# Patient Record
Sex: Female | Born: 1940
Health system: Southern US, Community
[De-identification: ages and names within clinical notes are randomized; demographics above are authoritative.]

## PROBLEM LIST (undated history)

## (undated) DIAGNOSIS — I10 Essential (primary) hypertension: Secondary | ICD-10-CM

## (undated) DIAGNOSIS — F419 Anxiety disorder, unspecified: Secondary | ICD-10-CM

## (undated) DIAGNOSIS — E119 Type 2 diabetes mellitus without complications: Secondary | ICD-10-CM

## (undated) DIAGNOSIS — M1990 Unspecified osteoarthritis, unspecified site: Secondary | ICD-10-CM

## (undated) DIAGNOSIS — C801 Malignant (primary) neoplasm, unspecified: Secondary | ICD-10-CM

## (undated) DIAGNOSIS — F32A Depression, unspecified: Secondary | ICD-10-CM

## (undated) DIAGNOSIS — E039 Hypothyroidism, unspecified: Secondary | ICD-10-CM

## (undated) DIAGNOSIS — F329 Major depressive disorder, single episode, unspecified: Secondary | ICD-10-CM

## (undated) HISTORY — DX: Type 2 diabetes mellitus without complications: E11.9

## (undated) HISTORY — DX: Unspecified osteoarthritis, unspecified site: M19.90

## (undated) HISTORY — DX: Essential (primary) hypertension: I10

## (undated) HISTORY — PX: ABDOMINAL HYSTERECTOMY: SHX81

## (undated) HISTORY — PX: KNEE SURGERY: SHX244

## (undated) HISTORY — PX: HERNIA REPAIR: SHX51

## (undated) HISTORY — DX: Depression, unspecified: F32.A

## (undated) HISTORY — DX: Major depressive disorder, single episode, unspecified: F32.9

---

## 2000-05-21 ENCOUNTER — Encounter: Payer: Self-pay | Admitting: Orthopedic Surgery

## 2000-05-28 ENCOUNTER — Inpatient Hospital Stay (HOSPITAL_COMMUNITY): Admission: RE | Admit: 2000-05-28 | Discharge: 2000-06-03 | Payer: Self-pay | Admitting: Orthopedic Surgery

## 2000-05-28 ENCOUNTER — Encounter: Payer: Self-pay | Admitting: Orthopedic Surgery

## 2000-07-14 ENCOUNTER — Encounter: Admission: RE | Admit: 2000-07-14 | Discharge: 2000-08-26 | Payer: Self-pay | Admitting: Orthopedic Surgery

## 2001-04-20 ENCOUNTER — Encounter: Admission: RE | Admit: 2001-04-20 | Discharge: 2001-07-19 | Payer: Self-pay | Admitting: Family Medicine

## 2003-12-02 HISTORY — PX: GALLBLADDER SURGERY: SHX652

## 2004-04-23 ENCOUNTER — Inpatient Hospital Stay (HOSPITAL_COMMUNITY): Admission: EM | Admit: 2004-04-23 | Discharge: 2004-04-26 | Payer: Self-pay | Admitting: Emergency Medicine

## 2004-04-24 ENCOUNTER — Encounter (INDEPENDENT_AMBULATORY_CARE_PROVIDER_SITE_OTHER): Payer: Self-pay | Admitting: Specialist

## 2004-07-19 ENCOUNTER — Observation Stay (HOSPITAL_COMMUNITY): Admission: RE | Admit: 2004-07-19 | Discharge: 2004-07-21 | Payer: Self-pay | Admitting: *Deleted

## 2004-12-01 HISTORY — PX: BACK SURGERY: SHX140

## 2005-06-06 ENCOUNTER — Observation Stay (HOSPITAL_COMMUNITY): Admission: RE | Admit: 2005-06-06 | Discharge: 2005-06-07 | Payer: Self-pay | Admitting: *Deleted

## 2005-10-08 ENCOUNTER — Encounter: Admission: RE | Admit: 2005-10-08 | Discharge: 2005-10-08 | Payer: Self-pay | Admitting: Orthopedic Surgery

## 2005-12-05 ENCOUNTER — Encounter: Admission: RE | Admit: 2005-12-05 | Discharge: 2005-12-05 | Payer: Self-pay | Admitting: Family Medicine

## 2006-01-08 ENCOUNTER — Encounter: Admission: RE | Admit: 2006-01-08 | Discharge: 2006-01-08 | Payer: Self-pay | Admitting: Orthopedic Surgery

## 2006-01-12 ENCOUNTER — Encounter: Admission: RE | Admit: 2006-01-12 | Discharge: 2006-01-12 | Payer: Self-pay | Admitting: Orthopedic Surgery

## 2006-02-18 ENCOUNTER — Ambulatory Visit: Admission: RE | Admit: 2006-02-18 | Discharge: 2006-02-18 | Payer: Self-pay | Admitting: Gynecologic Oncology

## 2006-03-11 ENCOUNTER — Inpatient Hospital Stay (HOSPITAL_COMMUNITY): Admission: RE | Admit: 2006-03-11 | Discharge: 2006-03-16 | Payer: Self-pay | Admitting: Orthopedic Surgery

## 2006-03-11 ENCOUNTER — Ambulatory Visit: Payer: Self-pay | Admitting: Internal Medicine

## 2006-03-11 ENCOUNTER — Encounter (INDEPENDENT_AMBULATORY_CARE_PROVIDER_SITE_OTHER): Payer: Self-pay | Admitting: Specialist

## 2006-03-12 ENCOUNTER — Encounter (INDEPENDENT_AMBULATORY_CARE_PROVIDER_SITE_OTHER): Payer: Self-pay | Admitting: Interventional Cardiology

## 2006-10-27 ENCOUNTER — Ambulatory Visit: Admission: RE | Admit: 2006-10-27 | Discharge: 2006-10-27 | Payer: Self-pay | Admitting: Gynecologic Oncology

## 2006-12-22 ENCOUNTER — Ambulatory Visit (HOSPITAL_COMMUNITY): Admission: RE | Admit: 2006-12-22 | Discharge: 2006-12-23 | Payer: Self-pay | Admitting: Gynecologic Oncology

## 2006-12-22 ENCOUNTER — Encounter (INDEPENDENT_AMBULATORY_CARE_PROVIDER_SITE_OTHER): Payer: Self-pay | Admitting: *Deleted

## 2007-01-01 IMAGING — CR DG CHEST 1V PORT
2 series · 2 of 2 positions shown · non-contrast
Comparison: 12/05/05.
 PORTABLE CHEST - 1 VIEW, 03/11/06 AT 5599 HOURS:

CLINICAL DATA: PICC placement, spinal stenosis.

[view not recorded (1 of 2)]
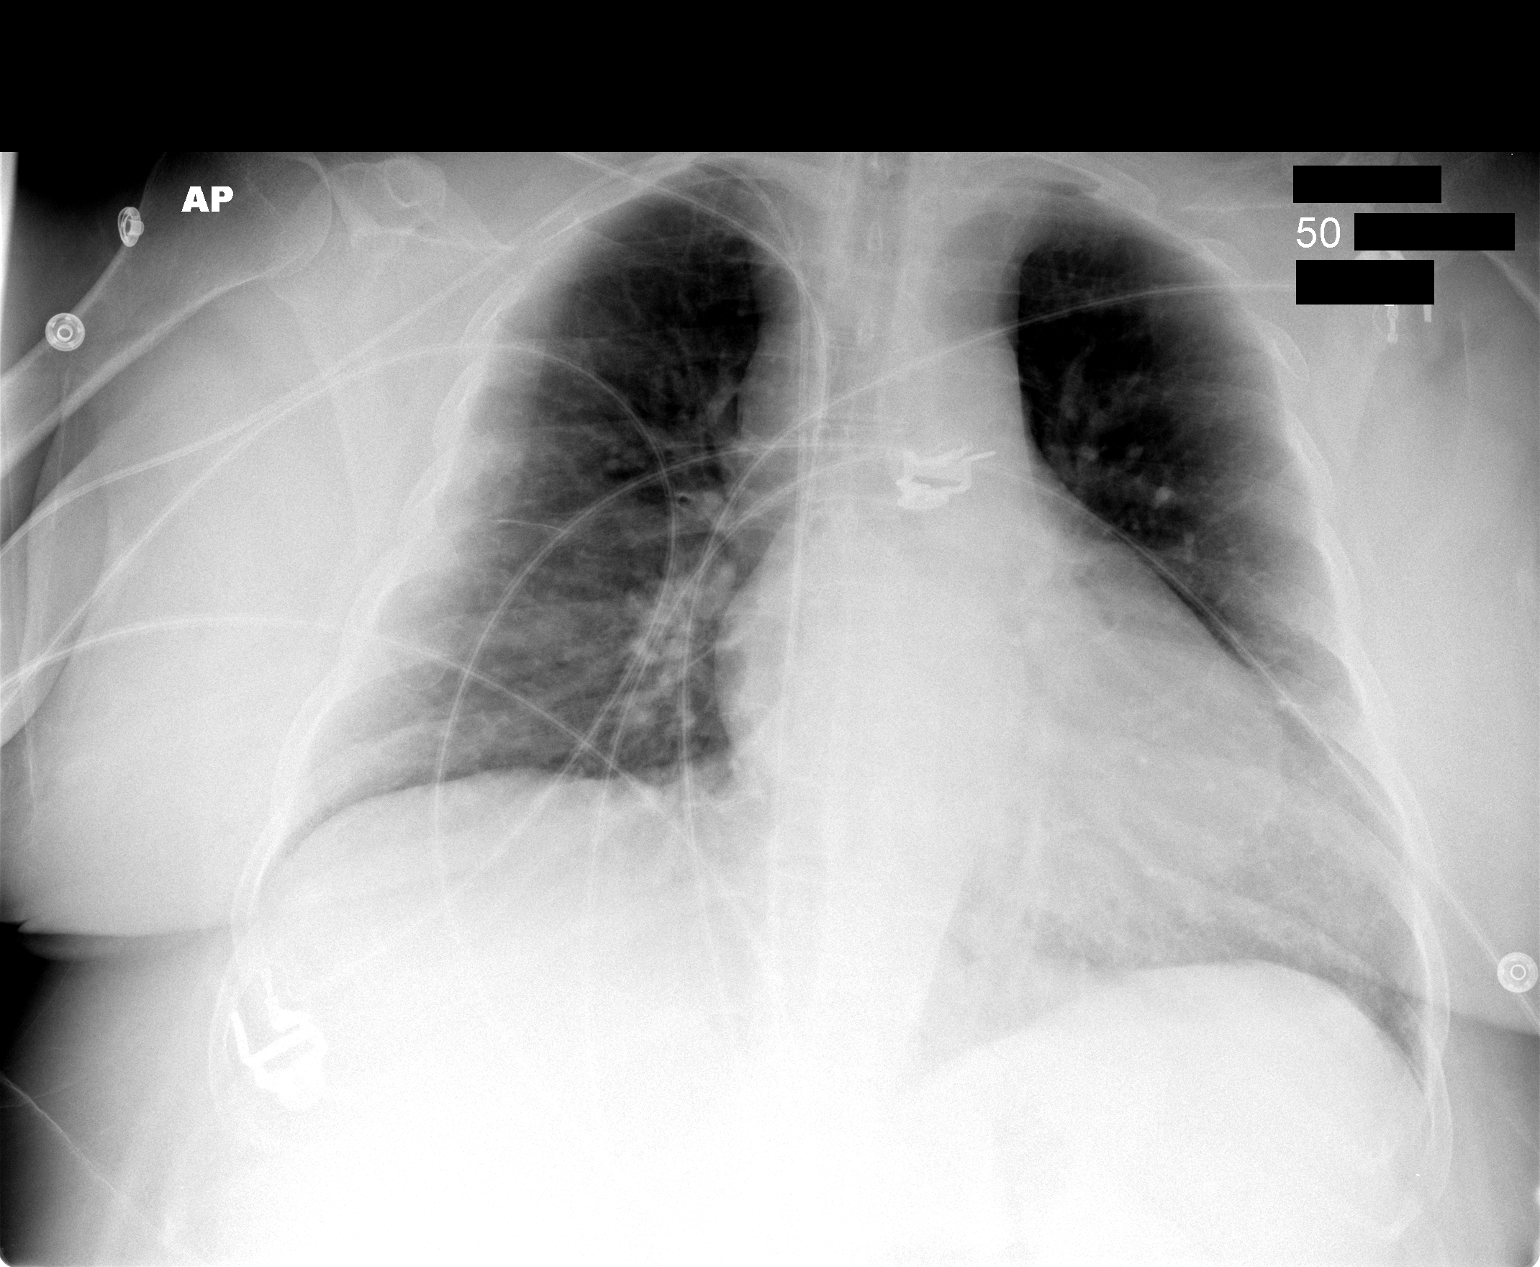

[view not recorded (2 of 2)]
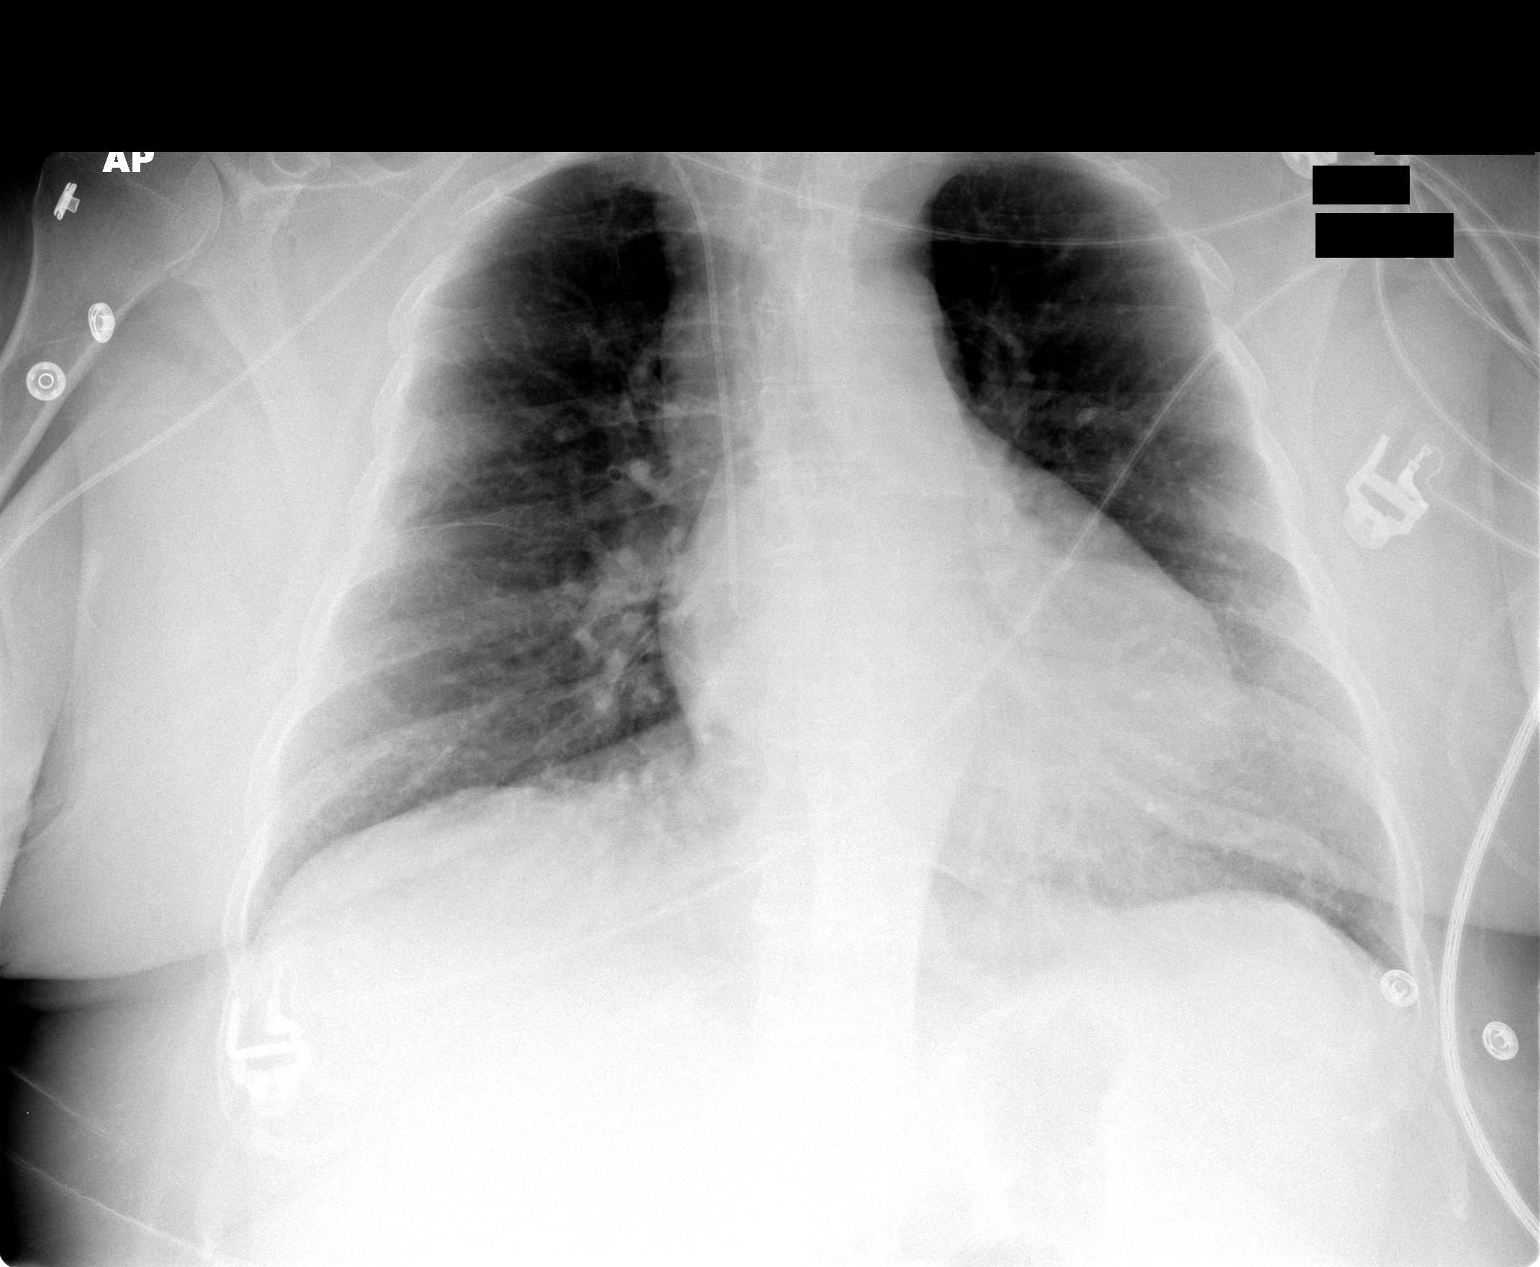

[2 of 2 positions shown; findings below may reference images not displayed]

FINDINGS: The lungs are under inflated with persistent central bronchitic changes.  A right upper extremity PICC has been placed.  The tip is at the SVC/right atrial junction.  The heart is normal in size.  No pneumothoraces or effusions are seen.
IMPRESSION: Bronchitic changes.  PICC.  Tip is at the SVC/right atrial junction.

## 2007-01-02 IMAGING — CR DG CHEST 1V PORT
1 series · 1 of 1 positions shown · non-contrast
Comparison: 03/11/06.

CLINICAL DATA: Spinal stenosis.  Follow-up atelectasis.
 PORTABLE CHEST - 1 VIEW - 03/12/06:

[view not recorded]
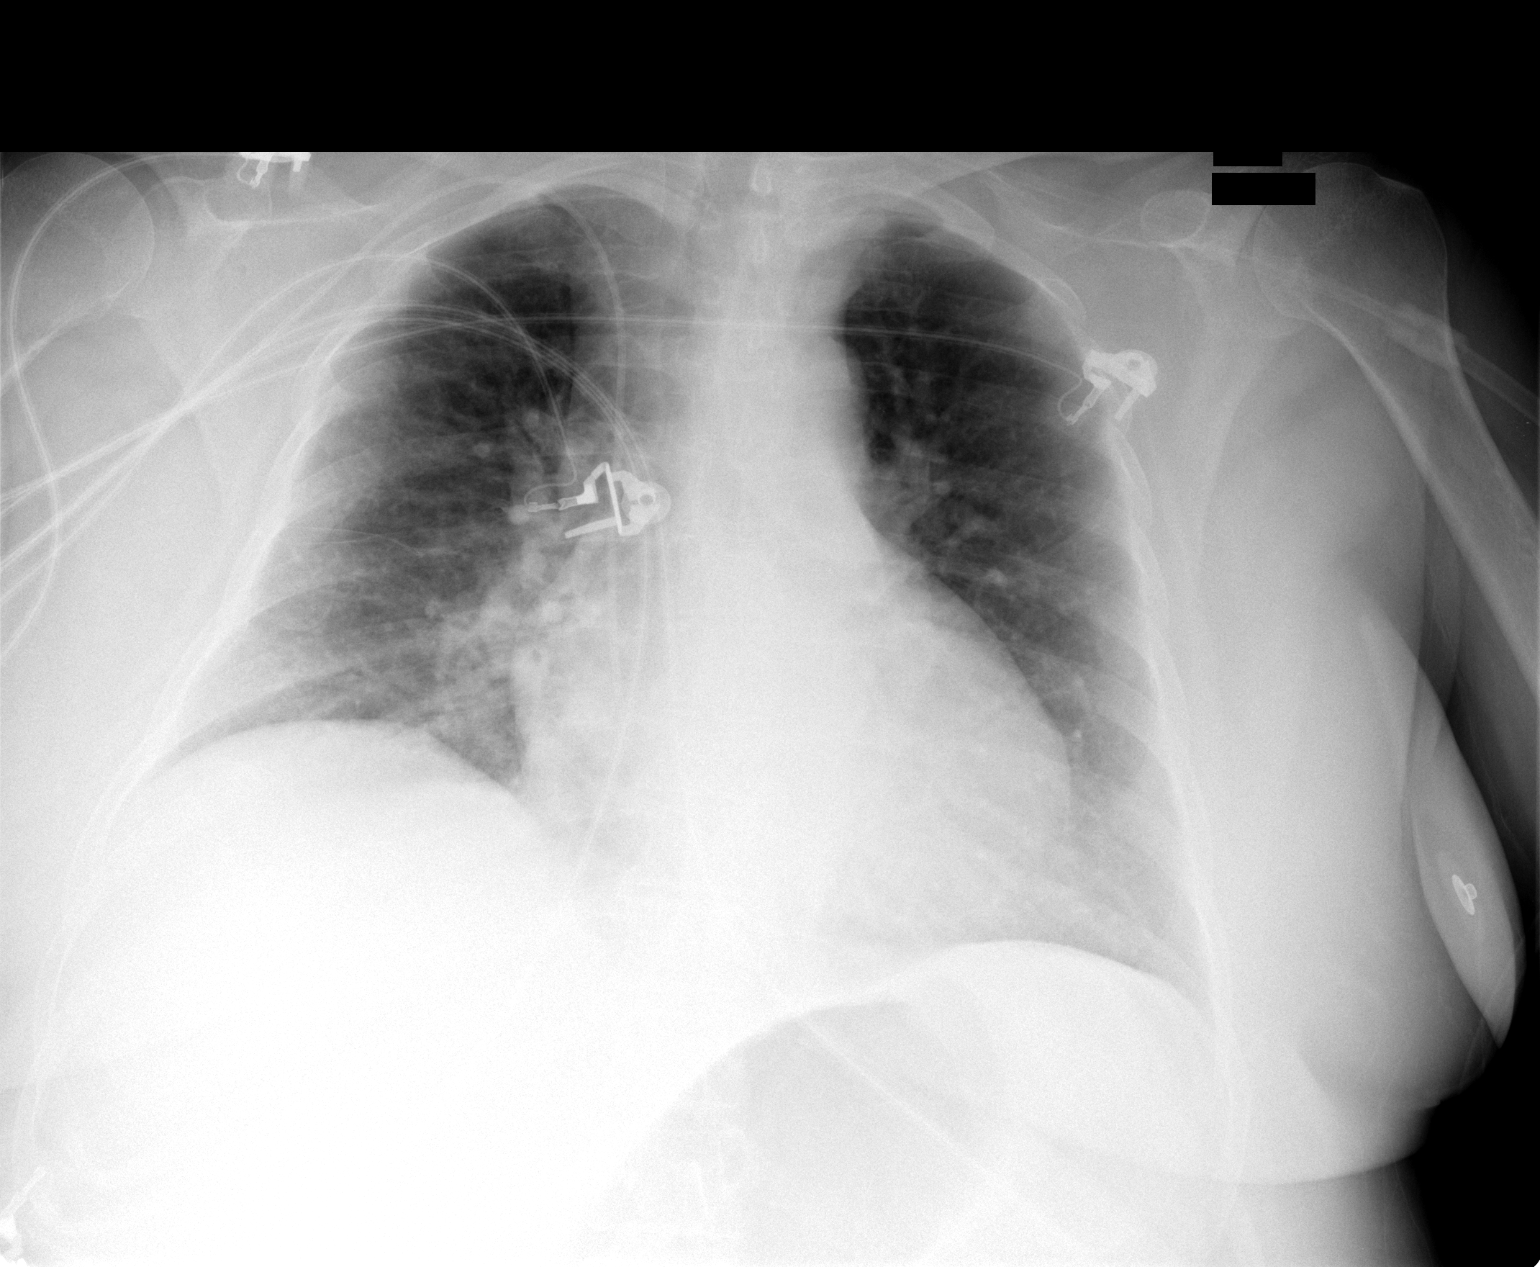

[1 of 1 positions shown; findings below may reference images not displayed]

FINDINGS: A right-sided PICC line is noted with the tip in the projection of the right atrium.  The heart size is mildly enlarged but there is no evidence of failure.  There is volume loss and mild atelectasis at the right base.
IMPRESSION: Mild right base atelectasis with volume loss.

## 2007-09-13 ENCOUNTER — Encounter: Admission: RE | Admit: 2007-09-13 | Discharge: 2007-09-13 | Payer: Self-pay | Admitting: Orthopedic Surgery

## 2007-10-08 ENCOUNTER — Ambulatory Visit (HOSPITAL_BASED_OUTPATIENT_CLINIC_OR_DEPARTMENT_OTHER): Admission: RE | Admit: 2007-10-08 | Discharge: 2007-10-08 | Payer: Self-pay | Admitting: Orthopedic Surgery

## 2008-02-11 ENCOUNTER — Inpatient Hospital Stay (HOSPITAL_COMMUNITY): Admission: RE | Admit: 2008-02-11 | Discharge: 2008-02-15 | Payer: Self-pay | Admitting: Orthopedic Surgery

## 2010-12-22 ENCOUNTER — Encounter: Payer: Self-pay | Admitting: General Surgery

## 2011-04-15 NOTE — Discharge Summary (Signed)
NAME:  [name], [name]              ACCOUNT NO.:  [id number]   MEDICAL RECORD NO.:  [id number]           PATIENT TYPE:  INP   LOCATION:                               FACILITY:  [facility]   PHYSICIAN:  Almedia Balls. Ranell Patrick, M.D. DATE OF BIRTH:  1941/07/16   DATE OF ADMISSION:  02/11/2008  DATE OF DISCHARGE:  02/15/2008                               DISCHARGE SUMMARY   ADMISSION DIAGNOSIS:  Right knee end-stage osteoarthritis.   DISCHARGE DIAGNOSIS:  Right knee end-stage osteoarthritis.   PROCEDURE:  The patient had a right total knee arthroplasty by Dr.  Malon Kindle on February 11, 2008.  Assistant was [employer].  General anesthesia was used plus a femoral block.  Estimated blood loss  was minimal with no complications.   BRIEF HISTORY:  The patient is a 70 year old female with worsening right  knee osteoarthritis.  Patient has elected to have a right total knee  arthroplasty by Dr. Malon Kindle since her symptoms were refractory to  conservative treatment.   HOSPITAL COURSE:  The patient was admitted on February 11, 1999 for the  above stated procedure which she tolerated well.  After adequate time in  the post anesthesia care unit she was transferred up to [address].  The  patient complained about moderate pain in the right knee on postop day  #1, but neurovascularly she was intact.  She was able to work with  physical therapy and also work with her CPM.  She was able to continue  working with physical therapy over the next several days.  We did have  some issues with pain, but the Dilaudid seemed to work best for her  since she does have multiple allergies to pain medicines.  She was able  to wean off the PCA and also the oxygen, and work with physical therapy  quite well before discharged home on postop day #4.  Her labs were  within acceptable limits.  Her incision was healing well with no signs  of cellulitis, erythema or infection.   DISCHARGE PLAN:  The patient has been  discharged home on February 15, 2008.   CONDITION:  Stable.   DIET:  Low sodium.   ALLERGIES:  The patient has allergies to CODEINE, MORPHINE, ASPIRIN,  OXYCODONE, DARVOCET, LATEX, VICODIN, and NOVOCAIN.   DISCHARGE MEDICATIONS:  1. Atenolol 25 mg p.o. daily.  2. Lipitor 80 mg p.o. q.h.s.  3. Celebrex 200 mg p.o. daily.  4. Vasotec 5 mg p.o. daily.  5. Ferrous sulfate 325 mg p.o. t.i.d.  6. Glucotrol 10 mg p.o. b.i.d.  7. Insulin 60 units subcu t.i.d. WC, 1-11 units subcu t.i.d. WC, 2-5      units subcu q.h.s.  8. Glucophage 500 mg p.o. daily a.c. and also 500 mg p.o. __________ .  9. Robaxin 500 mg p.o. t.i.d.  10.Zoloft 100 mg p.o. daily.  11.Coumadin per pharmacy protocol.  12.Albuterol 2 puffs of inhaler q.i.d. p.r.n.  13.Xanax 1 mg p.o. q.h.s. p.r.n.  14.Dilaudid 2 mg 1-2 tablets q.4-6 h. p.r.n. pain.   ACTIVITY:  Normal weightbearing with the aid of  a rolling walker.      Thomas B. Durwin Nora, P.A.      Almedia Balls. Ranell Patrick, M.D.  Electronically Signed    TBD/MEDQ  D:  02/15/2008  T:  02/15/2008  Job:  [id number]

## 2011-04-15 NOTE — Op Note (Signed)
NAME:  [name], [name]              ACCOUNT NO.:  [id number]   MEDICAL RECORD NO.:  [id number]          PATIENT TYPE:  INP   LOCATION:  1614                         FACILITY:  [facility]   PHYSICIAN:  Almedia Balls. Ranell Patrick, M.D. DATE OF BIRTH:  October 14, 1941   DATE OF PROCEDURE:  02/11/2008  DATE OF DISCHARGE:                               OPERATIVE REPORT   PREOPERATIVE DIAGNOSIS:  Right knee end-stage osteoarthritis.   POSTOPERATIVE DIAGNOSIS:  Right knee end-stage osteoarthritis.   PROCEDURE PERFORMED:  Right total knee replacement using the DePuy Sigma  rotating platform prosthesis.   ATTENDING SURGEON:  Almedia Balls. Ranell Patrick, M.D.   ASSISTANT:  Donnie Coffin. Durwin Nora, P.A.   General anesthesia plus femoral block anesthesia was used.   ESTIMATED BLOOD LOSS:  Minimal.   FLUID REPLACEMENT:  1700 mL crystalloid.   URINE OUTPUT:  250 mL.   TOURNIQUET TIME:  1-1/2 at 350 mmHg.   Instrument counts correct.  There were no complications.  Perioperative  antibiotics were given.   INDICATIONS:  The patient is a 70 year old female with worsening right  knee pain secondary to arthritis.  She presents now with disabling knee  pain and functional loss and desired total knee replacement.  Informed  consent was obtained.   DESCRIPTION OF PROCEDURE:  After an adequate level of anesthesia was  achieved with the patient positioned supine on the operating table, the  right leg was correctly identified and a nonsterile tourniquet placed  proximally.  The right leg was sterilely prepped and draped in the usual  manner.  After exsanguination of the limb using an Esmarch bandage, we  elevated the tourniquet to 350 mmHg.  A longitudinal midline incision  was created with the knee in flexion, dissection carried sharply through  subcutaneous tissues using Bovie.  We performed a medial parapatellar  arthrotomy using a fresh 10 blade scalpel.  The patella was everted, the  lateral patellofemoral ligaments divided.  The  distal femur was entered  with a step-cut drill, followed by a distal femoral resection guide  using a 5-degree right setting with 10 mm of resection.  After making  our distal resection, we sized the femur to a size 3.  We went ahead and  performed our anterior-posterior and chamfer cuts with a 4-in-1 block.  We then turned our attention towards the tibia, dividing the ACL and PCL  and subluxing the tibia anteriorly.  We went ahead and performed a 90-  degree perpendicular cut, 4 mm resection off the affected medial side of  the knee, 90 degrees perpendicular to long axis of the tibia.  We  removed excess meniscal tissue prior to that resection.  Once we had our  resection done, we checked our extension and flexion gaps using our  sizing blocks.  We then went and sized our tibia to a size 3 and went  ahead and finished preparing the tibia with the keel punch.  At this  point we went ahead and resected additional bone off the femur using the  size 3 box cut jig.  We then placed our real components in place after  resecting additional posterior bone and osteophytes off the posterior  femoral condyles.  The trial prostheses fit well.  We placed a 12.5  insert and took the knee through a full range of motion, having  excellent balance and full extension.  We then went and resected our  patella and did a freehand resurfacing starting 15 24 mm thickness and  resecting down to 16 to replace with 8 mm of patellar thickness with a  size 32 patella.  We took the knee through a range of motion, noted  excellent patellar tracking.  We removed all components, plugged the end  of the femur with available bone, thoroughly pulse-irrigated the knee  and then went ahead and cemented the real component, size 3 right femur  and a 3 tibia, into place, placing a 12.5 trial insert in the knee and  placed the knee in full extension.  We allowed the cement to harden.  We  had also cemented the patella at the same  time using a patellar clamp.  We then removed excess cement using a curved osteotome, thoroughly  irrigated and then placed a real 12.5 polyethylene insert, noting  excellent soft tissue balance in full extension with the 12.5 trial.  With the real insert in, we went ahead and closed the knee with slight  flexion with interrupted #1 PDS suture for the parapatellar arthrotomy,  followed by 0 and 2-0 Vicryl for subcutaneous closure and 4-0 Monocryl  for skin.  Steri-Strips were applied, followed by a sterile dressing.  The patient tolerated the surgery well.      Almedia Balls. Ranell Patrick, M.D.  Electronically Signed     SRN/MEDQ  D:  02/11/2008  T:  02/12/2008  Job:  [id number]

## 2011-04-15 NOTE — H&P (Signed)
NAME:  [name], [name]              ACCOUNT NO.:  [id number]   MEDICAL RECORD NO.:  [id number]          PATIENT TYPE:  INP   LOCATION:  1614                         FACILITY:  [facility]   PHYSICIAN:  Almedia Balls. Ranell Patrick, M.D. DATE OF BIRTH:  [birth year]   DATE OF ADMISSION:  02/11/2008  DATE OF DISCHARGE:                              HISTORY & PHYSICAL   CHIEF COMPLAINTS:  Right knee pain.   HISTORY OF PRESENT ILLNESS:  The patient is a 70 year old female with  worsening right knee pain has been refractory to conservative treatment.  The patient has elected have a right total knee arthroplasty by Dr.  Malon Kindle.   PAST MEDICAL HISTORY:  1. Osteoporosis.  2. Migraines.  3. Bronchitis.  4. Hypertension.  5. Diabetes.   FAMILY MEDICAL HISTORY:  Coronary artery disease, hypertension, and  diabetes.   SOCIAL HISTORY:  The patient is disabled.  Does not smoke or use  alcohol.  Patient of Dr. Azucena Cecil at [place].   DRUG ALLERGIES:  1. MORPHINE.  2. CODEINE.  3. NOVOCAIN.  4. ASPIRIN.  5. DARVOCET.   CURRENT MEDICATIONS:  1. Vasotec 5 mg p.o. daily.  2. Glipizide 10 mg p.o. daily.  3. Metformin 500 mg 2 tablets each morning.  4. Xanax 0.5 mg p.o. t.i.d.  5. Zoloft 200 mg p.o. daily.  6. Zyrtec 10 mg p.o. daily.  7. Robaxin 500 mg t.i.d. p.r.n.  8. Atenolol 25 mg p.o. daily.  9. Glipizide 10 mg daily.  10.Metformin 500 mg at bedtime.  11.Lipitor 80 mg daily.  12.Tylenol Extra Strength p.r.n.  13.Albuterol inhaler 1-2 puffs q.4-6 h. p.r.n.  14.Ultram 50 mg 1-2 tablets q.4-6 h. p.r.n. pain.   REVIEW OF SYSTEMS:  Pain with ambulation.  Recent occasional headaches,  and also ongoing neck pain.   PHYSICAL EXAMINATION:  VITAL SIGNS:  Pulse 80, respirations 18, blood  pressure 138/78.  GENERAL:  The patient is a 70 year old female in no acute distress.  Pleasant mood and affect.  Alert and oriented x3.  HEAD/NECK:  Shows full range of motion without any difficulty, except  some  mild tenderness along the __________ secondary to known spasm.  Cranial nerves II-XII grossly intact.  CHEST:  Has active breath sounds bilaterally, with no wheezes, rhonchi,  or rales.  HEART:  Shows regular rate and rhythm.  No murmur.  ABDOMEN:  Nontender, nondistended, with active bowel sounds.  EXTREMITIES:  Show moderate tenderness of the right knee, with moderate  crepitus, especially of the medial joint line.  SKIN:  Shows known neurovascularly she is intact, with no rashes.   X-RAY:  Shows end-stage osteoarthritis right knee.   IMPRESSION:  End-stage osteoarthritis right knee.   PLAN OF ACTION:  Right total knee arthroplasty by Dr. Malon Kindle.      Thomas B. Durwin Nora, P.A.      Almedia Balls. Ranell Patrick, M.D.  Electronically Signed    TBD/MEDQ  D:  02/10/2008  T:  02/11/2008  Job:  [id number]

## 2011-04-15 NOTE — Op Note (Signed)
NAME:  [name], [name]              ACCOUNT NO.:  [id number]   MEDICAL RECORD NO.:  [id number]          PATIENT TYPE:  AMB   LOCATION:  [facility]                         FACILITY:  [facility]   PHYSICIAN:  Almedia Balls. Ranell Patrick, M.D. DATE OF BIRTH:  08/27/1941   DATE OF PROCEDURE:  10/08/2007  DATE OF DISCHARGE:  10/08/2007                               OPERATIVE REPORT   PREOPERATIVE DIAGNOSIS:  Right knee medial meniscus tear.   POSTOPERATIVE DIAGNOSES:  1. Right knee medial meniscus tear.  2. Right knee medial femoral condyle chondromalacia.  3. Patellofemoral chondromalacia.   PROCEDURE PERFORMED:  Right knee arthroscopy, debridement of medial  meniscus tear and abrasion chondroplasty of the patellofemoral joint and  medial femoral condyle.   SURGEON:  Almedia Balls. Ranell Patrick, M.D.   ASSISTANT:  None.   ANESTHESIA:  Laryngeal mask anesthesia plus MAC was used.   ESTIMATED BLOOD LOSS:  Minimal.   FLUID REPLACED:  1200 mL crystalloid.   INSTRUMENT COUNTS:  Correct.   COMPLICATIONS:  None.   Perioperative antibiotics were given.   INDICATIONS FOR PROCEDURE:  Ms. [name] is a 70 year old female with an  injury to her right knee and has had a medial meniscus tear which is  proven by MRI scan. The patient has failed to progress with conservative  management including ice, activity modifications, antiinflammatories and  injections and presents now for operative treatment to restore function  and eliminate pain. Informed consent was obtained.   DESCRIPTION OF PROCEDURE:  After an adequate level of anesthesia was  achieved, the patient was positioned supine on the operating room table.  The right leg was placed in an arthroscopic leg holder and correctly  identified, the left leg was placed in a well leg holder. After sterile  prep and drape of the right knee, we entered the knee arthroscopically  using standard arthroscopic portals including superolateral outflow,  anterolateral scoping and  anteromedial working portals. We identified  some mild grade 3 chondromalacia on the patella which was centrally  located. In the trochlea, she had some grade 3 wear as well which  reflects down to the notch and was little bit deeper more distally. She  had no significant wear near the medial and lateral gutters. There was  no patellar maltracking noted, no loose bodies noted in the gutter. No  significant medial plica was noted. The medial compartment was entered,  there was noted to be a complex posterior horn medial meniscus tear. I  performed a partial medial meniscectomy using basket forceps and  notarized shaver removing about 40% of the posterior horn of the medial  meniscus. The remainder of the meniscus was stable. There was a fairly  large 2 x 2 cm area of partial thickness cartilage wear with loose flaps  of cartilage. This was grade 3 changes. We performed an abrasion  chondroplasty on this area. The tibial cartilage had some mild grade 2  wear. ACL and PCL intact. The lateral compartment pristine with minimal  chondromalacia noted and intact meniscus. Following the completion of  the partial meniscectomy and the abrasion chondroplasty in the medial  compartment  and patellofemoral compartment we concluded surgery suturing  the wound with 4-0 Monocryl followed by Steri-Strips and a sterile  dressing. The patient tolerated the procedure well and was taken to the  recovery room. This was done at the [facility].      Almedia Balls. Ranell Patrick, M.D.  Electronically Signed     SRN/MEDQ  D:  10/11/2007  T:  10/12/2007  Job:  [id number]

## 2011-04-18 NOTE — Op Note (Signed)
NAME:  [name], [name]              ACCOUNT NO.:  [id number]   MEDICAL RECORD NO.:  [id number]          PATIENT TYPE:  INP   LOCATION:  0002                         FACILITY:  [facility]   PHYSICIAN:  Georges Lynch. Gioffre, M.D.DATE OF BIRTH:  Dec 23, 1940   DATE OF PROCEDURE:  DATE OF DISCHARGE:                                 OPERATIVE REPORT   PREOPERATIVE DIAGNOSES:  1.  Lateral recess stenosis L4-5 on the left.  2.  Herniated lumbar disk L4-5 on the left.   POSTOPERATIVE DIAGNOSES:  1.  Lateral recess stenosis L4-5 on the left.  2.  Herniated lumbar disk L4-5 on the left.   SURGEON:  Dr. Georges Lynch. Darrelyn Hillock, MD.   OPERATIONS:  Dr. Jene Every, MD.   OPERATION:  1.  Decompression of the lateral recess at L4-5 on the left.  2.  Microdiskectomy at L4-5 on the left.   PROCEDURE:  Under general anesthesia routine orthopedic prep and draping of  the lower back was carried out.  The patient had 1 gram of IV Ancef preop.  At this time, two needles were placed in the back for localization purposes  and x-ray was taken.  Following that, an incision was made over the L4-5  interspace, bleeders identified and cauterized.  We then went down stripped  the muscle from the lamina in the spinous process on the left.  We then took  another x-ray to verify our position.  Once we documented we were at the L4-  5 space, we then went on and carried out our hemilaminectomy in the usual  fashion.  She had rather significant lateral recess stenosis.  We  decompressed the lateral recess first utilizing Kerrison rongeurs and the  double-action rongeur.  We identified the ligamentum flavum, we protected  the dura and then removed the ligamentum flavum, went out laterally and  cauterized the lateral recess veins with a bipolar.  Once we freed up the  dura and the L5 root, we freed up the L5 root by doing a nice foraminotomy.  We identified the disk. She had a definite herniated disk at L4-5 on the  left. A  cruciate incision was made in the posterior longitudinal ligament  and a microdiskectomy was carried out.  Following that, we went out far  laterally into the foramen, we decompressed the foramen as well, examined  the four root and the four root was thoroughly decompressed. We had a real  nice decompression.  We thoroughly irrigated out the area and then loosely  applied some thrombin-soaked Gelfoam and closed the wound in layers in the  usual fashion.  I left the deep proximal part of the wound open for drainage  purposes.  The remaining part of the wound was closed as I mentioned above  and we closed the skin with metal staples and a sterile Neosporin dressing  was applied.  She had 1 gram of IV Ancef preop.  She had Toradol 30 mg IV at  the end of the procedure.          ______________________________  Georges Lynch. Darrelyn Hillock, M.D.  RAG/MEDQ  D:  03/11/2006  T:  03/11/2006  Job:  [id number]

## 2011-04-18 NOTE — Consult Note (Signed)
NAME:  [name], [name]              ACCOUNT NO.:  [id number]   MEDICAL RECORD NO.:  [id number]          PATIENT TYPE:  OUT   LOCATION:  GYN                          FACILITY:  [facility]   PHYSICIAN:  Paola A. Duard Brady, MD    DATE OF BIRTH:  May 07, 1941   DATE OF CONSULTATION:  DATE OF DISCHARGE:                                   CONSULTATION   Patient is seen today in consultation at the request of Dr. Floyde Parkins.   [name] is a 70 year old gravida 5, para 5 who underwent a vaginal  hysterectomy for fibroids and menorrhagia several years ago.  She was seen  by Dr. Darrelyn Hillock secondary to back pain.  She underwent an MRI of the pelvis  on January 08, 2006.  Within it, it appeared that she had several cysts  within the left ovary, the largest being 3.4 cm in diameter with some  internal signal abnormalities, which could represent debris.  She also had  moderate-to-severe joint arthritis at L5-S1 and is scheduled for back  surgery, per her report on April 1.  She was seen by Dr. Rosalio Macadamia on March  7th and underwent a CA-125 testing, which was normal at 5.  In addition, she  underwent a transvaginal ultrasound, which revealed a right ovary with a  simple cyst, and the left ovary contained a 5 cm solid mass, consistent with  a fibroma, which is complex in nature, with some fluid in the cul de sac.  This is not appreciated on MRI.  The patient herself says that if she had  not had the back pain and the MRI of her back, she would never have known  she had an ovarian cyst.  She does complain of some urge incontinence but is  otherwise asymptomatic.   REVIEW OF SYSTEMS:  She denies any chest pain, shortness of breath, nausea,  vomiting, fevers, chills, headaches, or visual changes.  She complain of  significant back and neck pain and is on disability for neck pain.  She  denies any abdominal bloating or early satiety.  She is quite sedentary and  has a poor exercise tolerance as a result  of that.  She denies any blood in  her stool, any change in her bowel caliber.  She does complain of urge  incontinence, as stated above.   ALLERGIES:  1.  CODEINE.  2.  MORPHINE.  3.  ASPIRIN.  4.  NOVOCAIN.   PAST MEDICAL HISTORY:  1.  Diabetes.  2.  Hypertension.   PAST SURGICAL HISTORY:  1.  Cataract surgeries.  2.  Left knee replacement.  3.  Cholecystectomy done laparoscopically.  4.  A laparoscopic umbilical hernia repair.  Two subsequent hernia repairs      in the umbilicus with mesh.  5.  Back disk surgery.  6.  Hysterectomy.   MEDICATIONS:  1.  Meprozine 50/25 q.4h. p.r.n.  2.  Tramadol 50 mg q.6h. p.r.n.  3.  Glipizide 10 mg twice daily.  4.  Methocarbamol 500 mg q.6h.  5.  Sertraline 100 mg daily.  6.  Enalapril 20  mg daily.  7.  Zyrtec 10 mg daily.  8.  Alprazolam 0.5 mg b.i.d. p.r.n.  9.  Triamterene/hydrochlorothiazide 75/50 1 p.o. daily.  10. Metformin 500 mg twice daily.  11. Lipitor 80 mg daily.   FAMILY HISTORY:  Her mother died in 01/04/2006.  She has a sister with  lung cancer.  There is high blood pressure, coronary disease, diabetes, and  heart disease in her family.   SOCIAL HISTORY:  She does not smoke, use alcohol.  She is currently  disabled.   PHYSICAL EXAMINATION:  VITAL SIGNS:  Weight 200 pounds.  Height 5 feet.  GENERAL:  Well-developed and well-nourished female in no acute distress.  NECK:  Supple.  There is no lymphadenopathy or thyromegaly.  LUNGS:  Clear to auscultation bilaterally.  CARDIOVASCULAR:  Regular rate and rhythm.  ABDOMEN:  She has a well-healed vertical skin incision as well as  laparoscopy skin incisions.  Abdomen is morbidly obese, soft, nontender,  nondistended.  There are no palpable masses or hepatosplenomegaly.  Exam is  limited severely by habitus.  She has minimal tenderness in the deep right  lower quadrant.  Groins are negative for adenopathy.  EXTREMITIES:  There is no edema.  PELVIC:  Bimanual  examination reveals no masses or nodularity.  Rectal  confirms there is no stool in the vault for guaiac.   ASSESSMENT:  A 70 year old morbidly obese female with multiple medical  comorbidities, who has had three umbilical hernia repairs, who has by MRI a  multicystic left ovary by ultrasound in Dr. Particia Jasper office.  She has a  solid left ovarian mass and a normal CA-125.  I had a lengthy discussion  with the patient and with her friend here.  We spoke for over 30 minutes  face-to-face time independent of her examination and initial consultation.  I do not believe that this represents a metastatic or malignant process;  however, this cannot be excluded completely.  I do think she needs surgical  intervention; however, her back pain is severe and disabling, and I think  she needs to proceed with her back surgery with Dr. Darrelyn Hillock prior.  She was  given my card.  I discussed with her that we will attempt this  laparoscopically to decrease her morbidity, as she has multiple medical  comorbidities.  I assume she will be undergoing preoperative cardiac risk  stratification and clearance prior to her back surgery, and we can use that  as well.  We will notify her primary doctor, Dr. Azucena Cecil, that we were  considering abdominal surgery and he can evaluate whether she needs any  different preoperative evaluation than she would for her back surgery.  She  was given my card as well as that of Telford Nab.  She will call me once  she is recovering from her back surgery so  we can tentatively schedule her for laparoscopic BSO.  She understands the  mass will be sent for frozen section.  If it is benign, we will complete the  procedure, otherwise we will need to proceed with laparotomy for staging.  She does know that a laparotomy may be required to remove the mass.      Paola A. Duard Brady, MD  Electronically Signed    PAG/MEDQ  D:  02/18/2006  T:  02/19/2006  Job:  [id number]   cc:   Sherry A.  Rosalio Macadamia, M.D.  Fax: [phone]   Telford Nab, R.N.  [address]  [place], [place] [address]  Tally Joe, M.D.  Fax: [phone]   Alfonse Ras, MD  [address]  [place]  [place] [address]   Georges Lynch. Darrelyn Hillock, M.D.  Fax: [phone]   Almedia Balls. Ranell Patrick, M.D.  Fax: [phone]

## 2011-04-18 NOTE — Discharge Summary (Signed)
NAME:  [name], [name]              ACCOUNT NO.:  [id number]   MEDICAL RECORD NO.:  [id number]          PATIENT TYPE:  INP   LOCATION:  1420                         FACILITY:  [facility]   PHYSICIAN:  Georges Lynch. Gioffre, M.D.DATE OF BIRTH:  05/20/1941   DATE OF ADMISSION:  03/11/2006  DATE OF DISCHARGE:  03/16/2006                                 DISCHARGE SUMMARY   ADMISSION DIAGNOSES:  1.  Spinal stenosis with lateral recess at L4-5 on the left.  2.  Hypertension.  3.  Diabetes.  4.  History of cervical degenerative disease.   DISCHARGE DIAGNOSES:  1.  Status post decompression of the L4-5 left with microdiskectomy at L4-5      left.  2.  Postoperative hypotension requiring Neo-Synephrine and then follow up      with dopamine drip for maintaining systolic blood pressure felt related      to Vasotec versus perioperative beta blocker.  3.  History of hypertension.  4.  History of diabetes.  5.  History of cervical degenerative disease.   HISTORY OF PRESENT ILLNESS:  The patient is a 70 year old female having  lower back pain and left leg numbness since early last summer.  She has been  evaluated by Dr. Darrelyn Hillock, and found to have classic L4-5 nerve distribution  of stenosis and numbness, confirmed with myelogram and the MRI.  Proceeded  to have a decompression or lateral recess at L4-5 and had a microdiskectomy  at L4-5 on the left.   ALLERGIES:  1.  CODEINE.  2.  MORPHINE.  3.  VICODIN.  4.  NOVOCAIN.  5.  ASPIRIN.  6.  OXYCODONE.  7.  DARVOCET.   The patient tolerates Mepergan and fortis well.   CURRENT MEDICATIONS ON ADMISSION:  1.  Enalapril 20 mg a day.  2.  Robaxin 500 mg 3 times a day.  3.  Glipizide ER 10 mg 2 tablets q.a.m.  4.  Metformin 1000 mg in the morning.  5.  Triamterene/hydrochlorothiazide 75/50, one half tablet a day.  6.  Lipitor 80 mg a day.  7.  Alprazolam 0.5 mg twice a day.  8.  __________/HCL 100 mg in the a.m.  9.  Zyrtec 10 mg in the morning.  10. Ultram 1 or 2 tablets every 4-6 hours for pain.  11. Meprozine 25-50 mg twice a day p.r.n.  12. Atenolol 25 mg a day, just started perioperative for her cardiac      protection.   CONSULTS:  The following routine consults were requested:  Physical therapy,  occupational therapy and case management.   A hospitalist consult with Dr. Tresa Endo was requested for evaluation and  management of the patient's postoperative hypotension.  He placed her in the  ICU for a period of time and had a critical care consult for monitoring  while in the ICU.   HOSPITAL COURSE:  On March 11, 2006, the patient was admitted to [facility] under the care of Dr. Ranee Gosselin and was taken to the OR by Dr.  Ranee Gosselin, and assisted by Dr. Jillyn Hidden.  The patient underwent  a central  decompression at L4-5 with a microdiskectomy at L4-5 on the left.  The  patient tolerated the procedure well, but while in recovery room had some  hypotension, which initially was evaluated by anesthesia.  Patient was  placed on a Neo drip to maintain a blood pressure systolic of the 90s.  A  consult was requested for the hospitalist and Dr. Tresa Endo evaluated the  patient.  It was felt possibly related to the ACE inhibitors perioperatively  and was transitioned over to a dopamine drip to maintain blood pressures  greater than the 90s.  The patient's diuretic and blood pressure medications  were placed on hold.  She was worked up further with an echocardiogram and a  CT evaluation of her abdomen to rule out a retroperitoneal bleed due to her  surgical procedures.  Over the course of 24-48 hours, the patient's blood  pressures did normalize and she was weaned off of the dopamine drip.  During  her time in the ICU, she had no other medical issues and she was transferred  back to the orthopedic floor for routine postoperative care.  Blood pressure  did normalize to the 120s and 140s systolic.  She was able to restart her   routine preoperative meds, but recommended the diuretic to be placed on  hold.  Also, recommended to hold the patient's muscle relaxant and Tenormin.  The patient continued to do very well from the orthopedic standpoint.  She  had a minimal amount of discomfort in the lower back and the legs.  She has  had some residual numbness down the leg, but otherwise she had a good light  touch sensation and motor in the lower extremities bilaterally.  She worked  well with physical therapy and it was felt on postop day #5, she was  orthopaedically and medically stable, and recommended to be discharged to  home with recommendations to follow up with her primary care physician.  The  patient was discharged in good condition.   LABORATORY:  CBC on April 12:  WBCs 12.2, hemoglobin 9.9 and hematocrit  28.9.  Sed rate on April 12 was 40.  Routine chemistries on April 14:  Sodium of 139, potassium of 3.9, glucose 144 very labile between 93 and 180  during her hospitalization, BUN 6 and creatinine 0.7.  Cardiac markers on  April 11 at 1500 and found CK at 153, CK-MB 3.8, relative index of 2.5 and  troponin-I 0.02.  Followup on 2100:  CK is 176, CK-MB 4, relative index 2.3  and troponin-I is 0.02.  On April 12 at 0300:  CK 179, CK-MB 4.7, relative  index 2.6 and a troponin-I of 0.03.  BNP taken on April 12 showed 146.  Cortisol level on April 12 was 7.6 at 8:00 in the morning, at 3:00 in the  afternoon it was 10.7 and at 5:00 it was 26.7.  The summary of her 2-D echo  showed overall left ventricular systolic function was normal, left  ventricular ejection fraction estimated to be 60% with no evidence of left  ventricular wall motion abnormalities, left ventricular wall thickness  appears to the upper limits of normal and there was a mild focal basilar  septal hypertrophy.  Aortic valve thickness is mildly increased.  Aortic valve is mildly calcific.  There is mild calcification of the mitral valve  and mild  mitral annular calcification.  EKG on April 11 found sinus rhythm  with occasional periventricular complexes at 72 beats per  minute.   MEDICATIONS UPON DISCHARGE FROM ORTHOPEDICS FLOOR:  1.  Xanax 0.5 mg p.o. b.i.d.  2.  Lipitor 80 mg p.o. q.h.s.  3.  The patient is monitored per the glucose sliding scale protocol.  4.  Robaxin 500 mg p.o. t.i.d.  5.  Zoloft 100 mg p.o. b.i.d.  6.  Vasotec 10 mg p.o. b.i.d.  7.  Glucophage 500 mg p.o. b.i.d.  8.  MiraLax 17 g p.o. t.i.d.  9.  Miconazole topical cream to be applied to the affected area twice a day.  10. Senokot-S 1 tablet p.o. q.h.s. p.r.n.  11. Laxative enema of choice p.r.n.  12. Phenergan 25 mg p.o. q.6h. P.r.n.  13. Ultram 50 mg p.o. q.i.d. p.r.n.  14. Demerol 50 mg p.o. q.4h. p.r.n.   DISCHARGE INSTRUCTIONS:  DIET:  The patient is to follow an 1800 calorie  diabetic diet.  ACTIVITY:  The patient is to walk with the use of assistance and walker  daily.  WOUND CARE:  Patient is to change dressing daily.  MEDICATIONS:  Patient can take the following medications:  1.  Vasotec 20 mg a day.  2.  Glipizide 10 mg p.o. b.i.d.  3.  Metformin 500 mg p.o. b.i.d.  4.  Lipitor 80 mg p.o. a day.  5.  Xanax 0.5 mg p.o. b.i.d.  6.  Zoloft 200 mg p.o. a day.  7.  Zyrtec 10 mg a day.  8.  Medical hospitalist does not want the patient to take the Dyazide and      Atenolol until after seeing primary care physician.  FOLLOW-UP:  The patient is to follow up with Dr. Darrelyn Hillock at [phone]  extension [phone] for a follow up 2 weeks from discharge.   [facility] for home health physical therapy.   The patient is to contact her medical doctor for follow up with primary care  doctor as directed by hospitalist.   CONDITION UPON DISCHARGE TO HOME:  Listed as improved and good.      Jamelle Rushing, P.A.    ______________________________  Georges Lynch Darrelyn Hillock, M.D.    RWK/MEDQ  D:  04/06/2006  T:  04/07/2006  Job:  [id number]

## 2011-04-18 NOTE — Discharge Summary (Signed)
NAME:  [name], [name]                        ACCOUNT NO.:  [id number]   MEDICAL RECORD NO.:  [id number]                   PATIENT TYPE:  OBV   LOCATION:  0478                                 FACILITY:  [facility]   PHYSICIAN:  Thornton Park. Daphine Deutscher, M.D.             DATE OF BIRTH:  1941-08-18   DATE OF ADMISSION:  07/19/2004  DATE OF DISCHARGE:  07/21/2004                                 DISCHARGE SUMMARY   ADMITTING DIAGNOSES:  Ventral hernia, status post laparoscopic  cholecystectomy and open hysterectomy done Apr 24, 2004.   PROCEDURE:  July 19, 2004, laparoscopic ventral hernia repair with mesh.   HOSPITAL COURSE:  This is a 70 year old white female, who underwent the  above-mentioned laparoscopic procedure on July 19, 2004, without incident.  Postoperatively, she was slow to mobilize but seemed to progress  satisfactorily.  She was unable to go home on postoperative day one and was  encouraged in mobilization, and bowel function returned as evidenced by  bowel movements, and she felt ready for discharge on postoperative day two,  July 21, 2004.  Her abdominal binder was in place.  She was instructed to  remove her surgical dressings on the day after discharge, meant to wear her  abdominal binder.  She was asked to return to see Dr. Luan Pulling in 2-3  weeks.  She was given a prescription for Vicodin to take with her for pain  if needed.   CONDITION:  Improved.   FINAL DIAGNOSIS:  Ventral incisional hernia, status post laparoscopic repair  with mesh.                                               Thornton Park Daphine Deutscher, M.D.    MBM/MEDQ  D:  07/21/2004  T:  07/21/2004  Job:  [id number]

## 2011-04-18 NOTE — Op Note (Signed)
NAME:  [name], [name]              ACCOUNT NO.:  [id number]   MEDICAL RECORD NO.:  [id number]          PATIENT TYPE:  AMB   LOCATION:  DAY                          [facility]ACILITY:  [facility]   PHYSICIAN:  Paola A. Duard Brady, MD    DATE OF BIRTH:  26-Nov-1941   DATE OF PROCEDURE:  DATE OF DISCHARGE:                               OPERATIVE REPORT   PREOPERATIVE DIAGNOSIS:  Stable persistent left pelvic mass, back pain.   POSTOPERATIVE DIAGNOSIS:  Endometrioma.   PROCEDURE:  Diagnostic laparoscopy, left salpingo-oophorectomy, lysis of  adhesions.   SURGEONS:  Vicenta Aly   ANESTHESIA:  General.   ANESTHESIOLOGISTCouncil Mechanic.   SPECIMENS:  Left salpingo-oophorectomy to Pathology.   BLOOD LOSS:  50 mL.   IV FLUIDS:  1750 mL.   URINE OUTPUT:  250 mL.   COMPLICATIONS:  None.   OPERATIVE FINDINGS:  Included a 3 cm left adnexal mass was adhesive  disease of the rectosigmoid colon to the mass. Normal-appearing right  tube and ovary. Omentum stuck to the anterior abdominal wall.   The patient was taken to the Operating Room and placed in the supine  position. Arms were tucked to her comfort. General anesthesia was then  induced. She was then placed in the dorsal lithotomy position with all  appropriate precautions being taken. The patient was then placed in  dorsal lithotomy position with all appropriate precautions. SCD's were  in place. Shoulder blocks were then applied again with all appropriate  precautions being taken. The abdomen was prepped in the usual sterile  fashion. The perineum was prepped in the usual sterile fashion. Foley  catheter was inserted into the urethra under sterile conditions. Sterile  sponge stick was in the vagina.   The patient was then draped. 2 mL of 25% Marcaine was injected  approximately 5 cm above the umbilicus attempting to avoid the patient's  prior herniorrhaphy repair with mesh. The skin was incised with sharp  dissection. We then  dissected down to the fascia using curved Mayo's.  The fascia was identified, tented and entered sharply. The fascial edges  were reinforced with 0 Vicryl on a UR6. The Roseanne Reno was then placed into  the abdomen. The abdomen was insufflated with CO2 gas. Never did the  patient's pressures increase over 15 mmHg. A small amount of  Trendelenburg was applied. The patient did not tolerate Trendelenburg  secondary to increased peaked inspiratory pressures despite pressure  control ventilation and minimal tidal volumes.   Our attention was drawn to the patient's left side. Washings were  obtained. The adnexal masses described above was noted. Our attention  was first drawn to the adhesive disease of the rectosigmoid colon to  this mass. Under direct visualization #5 lap ports were applied after  securing the location with 25% Marcaine. A 10/12 suprapubic was applied  under direct visualization. After these ports were applied sharp  dissection was taken to dissect the rectosigmoid colon free from the  left adnexal mass.  Again great care was taken to avoid the rectosigmoid  colon.  The retroperitoneum was opened. The IP was identified. Secondary  to difficulty with significant bowel, it was difficult to visualize the  ureter. We stayed well up on the ovary to try to avoid the ureter. The  IP was cauterized with bipolar cautery, transected with monopolar  cautery. Sharp dissection was then continued to relieve the  endometriotic cyst off of the side wall and the anterior bladder. The  cyst was entered and chocolate fluid was removed. Once the mass was  amputated it was placed in an EndoCatch bag and delivered. The left side  was copiously irrigated. It was noted to be hemostatic.   Our attention was then drawn to the right ovary. There was significant  adhesive disease of the right ovary. It looked otherwise normal.  Secondary to the patient's lack of Trendelenburg the small bowel was in  the  area of dissection and laparotomy with a needle tip was performed to  remove this right ovary. Based on preoperative discussions with the  patient the right ovary was left intact. We inspected the area under low  flow. All pedicles were noted to be hemostatic. Ports removed under  direct visualization noted to be hemostatic. The CO2 gas was removed  from the abdomen. The fascia was closed in the area of our camera port  using interrupted UR6-0 Vicryl's. The subcu tissues were approximated  with 4-0 Vicryl and the skin was closed using 4-0 Vicryl. Other port  sites were closed with 4-0 Vicryl. Steri-Strips were placed.   Shoulder blocks were removed. The patient was taken to the Recovery Room  in stable condition. All instrument, needle and Ray-Tec counts were  correct times two.      Paola A. Duard Brady, MD  Electronically Signed     PAG/MEDQ  D:  12/22/2006  T:  12/22/2006  Job:  [id number]   cc:   Sherry A. Rosalio Macadamia, M.D.  Fax: [phone]   Telford Nab, R.N.  [address]  [place], [place] [address]   Tally Joe, M.D.  Fax: [phone]   Alfonse Ras, MD  [address]  [place]  [place] [address]   Georges Lynch. Darrelyn Hillock, M.D.  Fax: [phone]   Almedia Balls. Ranell Patrick, M.D.  Fax: [phone]   Dr. Soundra Pilon, M.D.  Fax: [phone]

## 2011-04-18 NOTE — Op Note (Signed)
NAME:  [name], [name]                        ACCOUNT NO.:  [id number]   MEDICAL RECORD NO.:  [id number]                   PATIENT TYPE:  OBV   LOCATION:  0478                                 FACILITY:  [facility]   PHYSICIAN:  Vikki Ports, M.D.         DATE OF BIRTH:  Sep 23, 1941   DATE OF PROCEDURE:  07/19/2004  DATE OF DISCHARGE:                                 OPERATIVE REPORT   PREOPERATIVE DIAGNOSIS:  Vaginal hernia.   POSTOPERATIVE DIAGNOSIS:  Vaginal hernia.   PROCEDURE:  Laparoscopic ventral hernia repair with mesh.   SURGEON:  Vikki Ports, M.D.   ANESTHESIA:  General.   DESCRIPTION OF PROCEDURE:  Patient was taken to the operating room and  placed in a supine position after adequate general anesthesia was induced  using an endotracheal tube.  The abdomen was prepped and draped in a normal  sterile fashion.  Using the Optiview technique, a 10 mm trocar was placed in  the left upper quadrant.  Then two 5 mm trocars were placed in the left mid  abdomen.  Omental contents were reduced out of the hernia sac.  The hernia  itself only measured approximately 2 cm.  A 6 cm round dual-facing Parietex  mesh was then placed into the abdomen after Novofil sutures had been packed  to it.  The sutures were pulled up, laying the mesh completely over the  defect and extended at least 2 cm in each direction.  The sutures were tied  down to the anterior fascia.  The periphery of the mesh was tacked using  spiral tackers.  Pneumoperitoneum was released.  Trocars were removed.  Skin  was closed with staples.  The patient tolerated the procedure well and went  to the PACU in good condition.                                               Vikki Ports, M.D.    KRH/MEDQ  D:  07/19/2004  T:  07/19/2004  Job:  [id number]

## 2011-04-18 NOTE — Discharge Summary (Signed)
[facility]  Patient:    [name], [name]                     MRN: [id number] Adm. Date:  [id number] Attending:  Skip Mayer Dictator:   Druscilla Brownie. Shela Nevin, P.A.                           Discharge Summary  As arrangements were completely finalized on June 02, 2000 and the patient could not decide what kind of therapy to have, whether outpatient or home, we kept her another day for inpatient therapy.  She was discharged home on June 03, 2000 with no change in either the admission or discharge diagnoses and no change in the plan. DD:  06/03/00 TD:  06/03/00 Job: [id number]

## 2011-04-18 NOTE — Consult Note (Signed)
NAME:  [name], [name]              ACCOUNT NO.:  [id number]   MEDICAL RECORD NO.:  [id number]          PATIENT TYPE:  OUT   LOCATION:  GYN                          FACILITY:  [facility]   PHYSICIAN:  Paola A. Duard Brady, MD    DATE OF BIRTH:  1941/10/23   DATE OF CONSULTATION:  DATE OF DISCHARGE:                                 CONSULTATION   HISTORY OF PRESENT ILLNESS:  The patient is a 23, soon to be 70-year-  old, whom we initially saw in March of 2007 at the referral of Floyde Parkins, M.D.  This is a gravida 5, para 5, who underwent a vaginal  hysterectomy for fibroids and menorrhagia in the distant past.  She was  seen by Dr. Darrelyn Hillock secondary to back pain and underwent an MRI in  February of 2007.  At that time, she had a 3.4 cm ovarian mass with some  debris and questionable calcifications.  She underwent an ultrasound  that revealed a 5 cm solid mass in the left ovary with a normal CA-125.  Since we last saw her, she was interested in surgery at that time;  however, she had multiple other medical issues and has delayed her  return to [place] until now.  In April of 2007, she underwent back surgery  under the care of Dr. Darrelyn Hillock.  She has also had right eye cataracts and  questionable left eye; she has had laser of her left eye.  She is  overall doing fairly well.  She does have some urge incontinence, and  that has been getting worse over the past 3 months.  She states that her  sciatic is numb again.  It had improved significantly, and now it is  again returned.  She, otherwise, denies any chest pain, short of breath,  nausea, vomiting, fevers, chills, headaches, visual changes.  She denies  any early satiety.  She is no longer complaining of severe back pain.  She denies any abdominal bloating.  She states postoperatively from her  back surgery secondary to taking her antihypertensives in the morning,  she had a prolonged intensive care unit stay, the records of which are  not  available to me.   ALLERGIES:  1. CODEINE.  2. MORPHING.  3. ASPIRIN.  4. NOVOCAIN.   PAST MEDICAL HISTORY:  1. Diabetes.  2. Hypertension.   PAST SURGICAL HISTORY:  1. Cataracts.  2. Left knee replacement.  3. Cholecystectomy.  4. Laparoscopic umbilical hernia repair.  Two subsequent hernia      repairs with mesh.  5. Back surgery.  6. Hysterectomy.   MEDICATIONS:  Medication list is reviewed and is unchanged from March.  It includes:  1. _________.  2. Tramadol.  3. Glipizide.  4. Methocarbamol.  5. Sertraline.  6. Enalapril.  7. Zyrtec.  8. Alprazolam.  9. Triamterine hydrochlorothiazide.  10.Metformin.  11.Lipitor.   PHYSICAL EXAMINATION:  VITAL SIGNS:  Height 5 feet tall.  Well-  nourished, well-developed female in no acute distress.  NECK:  Supple.  There is no lymphadenopathy, no thyromegaly.  LUNGS:  Clear to auscultation bilaterally.  CARDIOVASCULAR:  Regular rate and rhythm.  ABDOMEN:  Shows a well-healed vertical skin incision, as well as  laparoscopy skin incisions.  Abdomen is morbidly obese, soft and  nontender.  There are no palpable masses.  Exam is limited by habitus.  She is diffusely tender in the suprapubic region post ultrasound.  Groins are negative for adenopathy.  Bimanual examination reveals no  masses or nodularity.  Exam is limited by habitus.   ASSESSMENT:  A 70 year old morbidly obese female with multiple medical  comorbidities who has had 3 umbilical hernias.  On ultrasound, she has a  stable left ovarian mass that has not changed since February.  I  discussed with the patient that with this less lengthy period of time, I  believe that this most likely does not represent a malignant process,  and we can continue to watch it conservatively.  The patient very much  wishes to have surgery and have the mass removed.  She states that she  has a sister who had ovarian cancer; when we had first met, she said it  was lung cancer, so it is  difficult to ascertain whether there are any  issues of gynecologic malignancies in the family history or not.  She  understands that there are significant risks of surgery including the  need for laparotomy and repeat hernia repairs, the risk of  cardiopulmonary events, deep venous thrombosis and even death.  She is  willing to accept these risks and would like to have surgery.  At this  point, she is tentatively scheduled for surgery on December 22, 2006.  I  will obtain her inpatient hospitalization records based on her  permission from her most  recent surgery for her back so we can elucidate what happened to her  with regards to being in the intensive care unit.  I will also need to  contact Dr. Luan Pulling, her surgeon, to evaluate the type of mesh and  location of the mesh the patient has.      Paola A. Duard Brady, MD  Electronically Signed     PAG/MEDQ  D:  10/27/2006  T:  10/28/2006  Job:  [id number]   cc:   Sherry A. Rosalio Macadamia, M.D.  Fax: [phone]   Telford Nab, R.N.  [address]  [place], [place] [address]   Tally Joe, M.D.  Fax: [phone]   Alfonse Ras, MD  [address]  [place]  [place] [address]   Georges Lynch. Darrelyn Hillock, M.D.  Fax: [phone]   Almedia Balls. Ranell Patrick, M.D.  Fax: [phone]   Dr. Luan Pulling

## 2011-04-18 NOTE — H&P (Signed)
NAME:  [name], [name]                        ACCOUNT NO.:  [id number]   MEDICAL RECORD NO.:  [id number]                   PATIENT TYPE:  INP   LOCATION:  0447                                 FACILITY:  [facility]   PHYSICIAN:  Vikki Ports, M.D.         DATE OF BIRTH:  Mar 27, 1941   DATE OF ADMISSION:  04/24/2004  DATE OF DISCHARGE:                                HISTORY & PHYSICAL   ADMISSION DIAGNOSES:  #1.  Acute cholecystitis.  #2.  Symptomatic cholelithiasis.   HISTORY OF PRESENT ILLNESS:  The patient is a 70 year old white female  patient of Dr. Royann Shivers who was seen by Dr. Frederik Schmidt earlier this  week. The patient has been having a 5-6 day history of epigastric right  upper quadrant abdominal pain. Workup has shown only gallstones with a  normal common bile duct diameter. No thickened gallbladder wall, normal  liver function tests. The patient was sent from the office yesterday with  pain medicine which was not helping. The patient returned to the emergency  room for pain control. She does have significant nausea, one to two episodes  of vomiting. She denies any fever.   PAST MEDICAL HISTORY:  #1.  Significant for diabetes mellitus.  #2.  Hypertension.   PAST SURGICAL HISTORY:  Significant for hysterectomy.   MEDICATIONS:  Glipizide, Avandia, enalapril, triamterene,  hydrochlorothiazide.   ALLERGIES:  ASPIRIN, CODEINE, MORPHINE.   PHYSICAL EXAMINATION:  GENERAL:  She is an age appropriate white female in minimal distress.  VITAL SIGNS:  Blood pressure 118/76, temperature 98, heart rate 101,  respiratory rate 18.  HEENT:  Normocephalic, atraumatic. Pupils are equal, round and reactive to  light. Sclerae are nonicteric. Conjunctivae are without injection.  NECK:  Supple and soft without thyromegaly or cervical adenopathy.  LUNGS:  Clear to auscultation and percussion x2.  HEART:  Regular rate and rhythm without murmurs, rubs or gallops.  ABDOMEN:   Moderately obese with minimal tenderness in the right upper  quadrant.  EXTREMITIES:  Show no clubbing, cyanosis or edema. Normal gait and station.   Liver function tests were normal. Glucose is 101. White count 18,000.  Urinalysis shows significant white blood cells in the urine.   IMPRESSION:  #1.  Acute cholecystitis.  #2.  Urinary tract infection.   PLAN:  Admission for laparoscopic cholecystectomy and antibiotics for  urinary tract infection.     Vikki Ports, M.D.               Vikki Ports, M.D.    KRH/MEDQ  D:  04/24/2004  T:  04/24/2004  Job:  [id number]   cc:   Royann Shivers, M.D.   Jimmye Norman III, M.D.  [address]  [place]  [place] [address]  Fax: [phone]

## 2011-04-18 NOTE — H&P (Signed)
NAME:  [name], [name]              ACCOUNT NO.:  [id number]   MEDICAL RECORD NO.:  [id number]          PATIENT TYPE:  INP   LOCATION:  NA                           FACILITY:  [facility]   PHYSICIAN:  Ronald A. Gioffre, M.D.DATE OF BIRTH:  [birth year]   DATE OF ADMISSION:  03/11/2006  DATE OF DISCHARGE:                                HISTORY & PHYSICAL   CHIEF COMPLAINT:  Lower back pain with numbness in the left leg.   HISTORY OF PRESENT ILLNESS:  The patient is a 70 year old female who has  been having some lower back  pain and left leg numbness since last early  summer.  She has been evaluated by Dr. Darrelyn Hillock extensively, and he feels  that she has a classic L4-5 nerve root distribution of stenosis and  numbness.  This has been confirmed with myelogram and MRI of her lower back.  The patient is going to proceed with a decompressive lateral recess at the  L4-5 nerve root by Dr. Darrelyn Hillock and Dr. Shelle Iron on March 11, 2006.   ALLERGIES:  CODEINE, MORPHINE, VICODIN, NOVOCAIN, ASPIRIN, OXYCODONE, and  DARVOCET.   The patient is able to tolerate Mepergan fortis without any problem.   CURRENT MEDICATIONS:  1.  Enalapril 20 mg a day.  2.  Robaxin 500 mg 3 times a day.  3.  Glipizide ER 10 mg 2 tablets in the a.m.  4.  Metformin 1000 mg in the a.m.  5.  Triamterene/hydrochlorothiazide 75/50 one-half tablet a day.  6.  Lipitor 80 mg a day.  7.  Alprazolam 0.5 mg 1 tablet twice daily.  8.  Sertraline HCL 100 mg 2 tablets in the a.m.  9.  Zyrtec 10 mg 1 tablet in the a.m.  10. Ultram 1 or 2 tablets every 4 to 6 hours for pain.  11. Meprozine 25 to 50 mg once or twice a day p.r.n.  12. Atenolol 25 mg a day, just started for perioperative cardiac protection.   PRIMARY CARE PHYSICIAN:  Dr. Susa Raring.   The patient received cardiac clearance from Dr. Armanda Magic with [facility].   CURRENT MEDICAL HISTORY:  1.  Hypertension.  2.  Diabetes.  3.  Significant degenerative disease of the  cervical spine.  4.  Multiple NARCOTIC ALLERGIES.   PAST SURGICAL HISTORY:  1.  Cholecystectomy.  2.  Multiple hernia repairs.  3.  Cataract removal.  4.  Hysterectomy.   FAMILY MEDICAL HISTORY:  Mother died recently in 05/08/06.  A sister had lung  cancer.  She does have hypertension, coronary artery disease, diabetes in  the family.   SOCIAL HISTORY:  The patient is widowed and lives alone, will have little  assistance after surgery.  She has been disabled since 81.  She does not  smoke or use alcohol.  She lives in a ground floor apartment.   REVIEW OF SYSTEMS:  Positive for occasional migraine headaches.  She does  have occasional anxiety attacks.  She does have some blurred vision related  to her cataracts. She does have occasional shortness of breath with  activity.  She  does have trouble with excessive urination at night.  She  does have significant right knee pain due to severe arthritis. She has a  left total knee arthroplasty in place.   PHYSICAL EXAMINATION:  GENERAL: The patient is a 70 year old female,  conscious, alert, and appropriate, ambulating with very slow, deliberate  gait.  Gets herself on and off the exam table without extreme difficulty,  appears to be in extreme distress.  VITAL SIGNS:  Pulse 72 and regular, blood pressure 110/60, respirations 12.  She is afebrile.  HEENT:  Head was normocephalic.  Pupils equal and reactive.  Gross hearing  is intact.  NECK: Supple.  She had some limited range of motion to about half range of  motion with extension and rotation right and left due to neck soreness.  CHEST: Lung sounds were clear and equal bilaterally.  HEART:  Regular rate and rhythm.  ABDOMEN:  Soft.  She had multiple abdominal incisions which are well healed.  She has positive bowel sounds.  No CVA tenderness.  EXTREMITIES:  Upper extremities:  She had symmetrical range of motion of her  shoulders.  She was unable to get her shoulders above shoulder  height.  She  had full range of motion of her elbows and wrists.  Motor strength 5/5.  Lower extremities:  Right and left hip had good internal and external  rotation.  She did have some decreased sensation over the lateral thigh from  the upper hip region all the way down to her lateral calf on the left.  She  had very slight muscle weakness on the left compared to the right.  NEUROLOGIC:  The patient was grossly neurologically intact.  The only  neurologic defect noted was light touch sensation decreased on the left  lower extremity.  BREASTS/RECTAL/GU:  Exams deferred at this time.   IMPRESSION:  1.  Spinal stenosis of the lateral recess at L4-5 on the left.  2.  Hypertension.  3.  Diabetes.  4.  History of cervical degenerative disease.   PLAN:  The patient has been cleared by the cardiology group at [facility] for  this upcoming surgical procedure.  The patient will undergo all the routine  labs and test prior to undergoing decompressive lateral recess at the L4-5  region by Dr. Darrelyn Hillock.  The patient did stress that she does live along and  will need to be able to be completely safe and  ambulatory prior to being discharged home.  She is also concerned about her  medications being dispensed correctly as this has been a problem in the  past.  We will try to see if we can continue her medications as written as  she indicates she is taking them at the current time to prevent any further  problems.      Jamelle Rushing, P.A.    ______________________________  Georges Lynch Darrelyn Hillock, M.D.    RWK/MEDQ  D:  03/10/2006  T:  03/10/2006  Job:  [id number]

## 2011-04-18 NOTE — Op Note (Signed)
[facility]  Patient:    [name], [name]                     MRN: [id number] Proc. Date: 05/28/00 Adm. Date:  [id number] Attending:  Skip Mayer CC:         Georges Lynch. Darrelyn Hillock, M.D.             Hadassah Pais. Jeannetta Nap, M.D.                           Operative Report  SURGEON:  Georges Lynch. Darrelyn Hillock, M.D.  ASSISTANT:  Paul Dykes. Grant Ruts., M.D.  PREOPERATIVE DIAGNOSIS:  Severe degenerative arthritis of her left knee with collapse of the medial joint space.  POSTOPERATIVE DIAGNOSIS:  Severe degenerative arthritis of her left knee with collapse of the medial joint space.  OPERATION:  Left total knee arthroplasty utilizing the [employer]onics system.  I utilized a posterior cruciate-sacrificing Scorpio-type prosthesis.  All three components were cemented.  Sizes of the components:  The patella was a 26 mm, the tibia was a size 7 tibial tray, the femoral component was a left posterior stabilized size 5 left.  PROCEDURE:  Under general anesthesia, routine orthopedic prep of the left lower extremity was carried out.  Leg was exsanguinated with the Esmarch and the tourniquet was elevated at 350 mmHg.  She had 1 g of IV Ancef preoperatively.  At this time, incision was made down the anterior aspect of the left leg.  Bleeders identified and cauterized.  I then went down and created two flaps.  I then carried out a median parapatellar incision, reflected the patella laterally and then flexed the knee and did lateral and medial meniscectomies and excised the anterior and posterior cruciate ligaments.  Following this, the initial drill hole was made in the intercondylar notch of the left femur.  The #1 jig was inserted.  The 12 mm thickness was cut from the distal femur.  We utilized a size 5 jig.  The #2 jig was inserted and we then carried out anterior, posterior and chamfer cuts in the usual fashion.  Following this, we then went and prepared the tibia.   I removed 4 mm thickness off the affected medial side.  The intermedullary guide was used as well to make our tibial cut.  Following this, we then made our box cut in the intercondylar notch of the femur in the usual fashion.  We then went on and inserted our trial components.  We utilized a size 5 femur left, size 5 left femur.  We utilized a  size 7 tibial tray with a 10 mm thickness insert with a posterior stabilized post.  We went through range of motion of the knee, had excellent stability.  Rotation then was marked onto the tibia at this point.  Following this, we then prepared the patella in usual fashion. We removed 10 mm thickness off the articular surface of the patella.  Three drill holes were then made in the patella for a size 26 patella.  We then removed the trial components, flexed the knee and cut our keel cut out of the tibial plateau in the usual fashion.  Thoroughly waterpicked out the knee, dried the knee out and cemented all three components in simultaneously.  All loose pieces of cement were removed.  Thoroughly waterpicked out the knee again and we finally inserted our permanent size 7 tibial insert with  10 mm thick.  The knee was then reduced and we did not do a lateral release, as the patella was stable.  We then inserted one Hemovac drain, irrigated out the knee and closed the knee in layered usual fashion.  Sterile, Neosporin bundle dressings were applied.  Patient left the operating room in satisfactory condition.   He had 1 g of IV Ancef preoperatively. DD:  05/28/00 TD:  05/29/00 Job: [id number]

## 2011-04-18 NOTE — Discharge Summary (Signed)
NAME:  [name], [name]                        ACCOUNT NO.:  [id number]   MEDICAL RECORD NO.:  [id number]                   PATIENT TYPE:  INP   LOCATION:  0447                                 FACILITY:  [facility]   PHYSICIAN:  Vikki Ports, M.D.         DATE OF BIRTH:  1941-02-03   DATE OF ADMISSION:  04/23/2004  DATE OF DISCHARGE:  04/26/2004                                 DISCHARGE SUMMARY   ADMISSION DIAGNOSIS:  Acute cholecystitis.   DISCHARGE DIAGNOSIS:  Acute cholecystitis.   PROCEDURE:  Laparoscopic cholecystectomy with intraoperative cholangiogram.   DISCHARGE DISPOSITION:  Discharged to home.   CONDITION ON DISCHARGE:  Good and improved.   MEDICATIONS:  Include Vicodin for pain.   FOLLOW UP:  With me in two weeks.   HOSPITAL COURSE:  Patient was admitted and started on IV antibiotics.  She  was taken to the operating room where she underwent laparoscopic  cholecystectomy with intraoperative cholangiogram.  Postoperatively, she did  well.  She had some nausea.  Her diet was advanced.  She was discharged home  on postoperative day #2.                                               Vikki Ports, M.D.    KRH/MEDQ  D:  05/22/2004  T:  05/23/2004  Job:  [id number]

## 2011-04-18 NOTE — Op Note (Signed)
NAME:  [name], [name]                        ACCOUNT NO.:  [id number]   MEDICAL RECORD NO.:  [id number]                   PATIENT TYPE:  OBV   LOCATION:  0478                                 FACILITY:  [facility]   PHYSICIAN:  Vikki Ports, M.D.         DATE OF BIRTH:  01/03/1941   DATE OF PROCEDURE:  07/19/2004  DATE OF DISCHARGE:  07/21/2004                                 OPERATIVE REPORT   ADDENDUM   PREOPERATIVE DIAGNOSIS:  Ventral hernia.   POSTOPERATIVE DIAGNOSIS:  Ventral hernia.                                               Vikki Ports, M.D.    KRH/MEDQ  D:  08/01/2004  T:  08/01/2004  Job:  [id number]

## 2011-04-18 NOTE — Op Note (Signed)
NAME:  [name], [name]              ACCOUNT NO.:  [id number]   MEDICAL RECORD NO.:  [id number]          PATIENT TYPE:  AMB   LOCATION:  DAY                          FACILITY:  [facility]   PHYSICIAN:  Vikki Ports, MDDATE OF BIRTH:  03-06-1941   DATE OF PROCEDURE:  06/06/2005  DATE OF DISCHARGE:                                 OPERATIVE REPORT   PREOPERATIVE DIAGNOSIS:  Recurrent ventral hernia.   POSTOPERATIVE DIAGNOSIS:  Recurrent ventral hernia.   PROCEDURE:  Ventral hernia repair with mesh.   SURGEON:  Vikki Ports, MD.   ANESTHESIA:  General.   DESCRIPTION OF PROCEDURE:  The patient was taken to the operating room,  placed in a supine position. After adequate anesthesia was induced using  endotracheal tube, the abdomen was prepped and draped in a normal sterile  fashion. Using a lower vertical midline incision to the umbilicus, I opened  the skin. The patient had incredibly thin fascia. The mesh was intact that  had been previously placed. The flaps were created in all directions, no  true hernia sac was seen. Again the fascia was incredibly thin and it was  imbricated together over the previously placed mesh using interrupted #1  Novofil sutures. A piece of PROCEED mesh was then cut to fit in an onlay  fashion and tacked in the periphery using a running 2-0 Prolene suture. A 19  Blake drain was placed and sutured to the skin. The skin was closed with  staples. The patient tolerated the procedure well and went to PACU in good  condition.       KRH/MEDQ  D:  06/06/2005  T:  06/06/2005  Job:  [id number]

## 2011-04-18 NOTE — Op Note (Signed)
NAME:  [name], [name]                        ACCOUNT NO.:  [id number]   MEDICAL RECORD NO.:  [id number]                   PATIENT TYPE:  INP   LOCATION:  0447                                 FACILITY:  [facility]   PHYSICIAN:  Vikki Ports, M.D.         DATE OF BIRTH:  19-Jul-1941   DATE OF PROCEDURE:  04/24/2004  DATE OF DISCHARGE:                                 OPERATIVE REPORT   PREOPERATIVE DIAGNOSIS:  Acute cholecystitis.   POSTOPERATIVE DIAGNOSIS:  Acute cholecystitis.   PROCEDURE:  Laparoscopic cholecystectomy with intraoperative cholangiogram.   SURGEON:  Danna Hefty, M.D.   ASSISTANT:  Claud Kelp, M.D.   ANESTHESIA:  General.   DESCRIPTION OF PROCEDURE:  The patient was taken to the operating room,  placed in a supine position.  After adequate general anesthesia was induced  using endotracheal tube, the abdomen was prepped and draped in the normal  sterile fashion.  A transverse infraumbilical incision was made, dissected  down to the fascia, and it was opened vertically.  An 0 Vicryl pursestring  suture was placed around the fascial defect.  Hasson trocar was placed in  the abdomen, and pneumoperitoneum was obtained.  A 10 mm trocar was placed  in the subxiphoid region; two 5 mm ports were placed in the right abdomen.  Gallbladder was tense and therefore aspirated.  It was retracted cephalad,  and dissection down at the neck of the gallbladder visualized the cystic  duct.  It was dissected with a good window posterior to it, clipped  proximally, and ductotomy was made.  Cholangiogram was performed which  showed a generous common bile duct, but no filling defect, normal  intrahepatic ducts.  Cholangiocatheter was removed.  Cystic duct was divided  after distal clips were then applied.  The gallbladder was taken off the  gallbladder bed using Bovie electrocautery, placed in an EndoCatch bag, and  removed through the umbilical port.  The infraumbilical  fascial defect was  closed with the 0 Vicryl pursestring suture.  Skin incisions were closed  with staples and injected with Marcaine 0.5%.  Sterile dressings were  applied.  The patient tolerated the procedure well and went to PACU in good  condition.                                               Vikki Ports, M.D.    KRH/MEDQ  D:  04/24/2004  T:  04/24/2004  Job:  [id number]

## 2011-04-18 NOTE — Discharge Summary (Signed)
[facility]  Patient:    [name], [name]                     MRN: [id number] Adm. Date:  [id number] Disc. Date: 06/02/00 Attending:  Skip Mayer CC:         Dr. Harrie Jeans A. Darrelyn Hillock, M.D.                           Discharge Summary  ADMISSION DIAGNOSES: 1.  Osteoarthritis, left knee. 2.  Hypercholesterolemia. 3.  Hypertriglyceridemia. 4.  Hypertension. 5.  Depression.  DISCHARGE DIAGNOSES: 1.  Osteoarthritis, left knee status post left total knee replacement     arthroplasty. 2.  Post hemorrhagic anemia. 3.  Postoperative hypokalemia. 4.  Hypercholesterolemia. 5.  Hypertriglyceridemia. 6.  Hypertension. 7.  Depression.  PROCEDURE:  The patient was taken to the OR on May 28, 2000, and underwent a left total knee replacement arthroplasty.  SURGEON:  Dr. Georges Lynch. Gioffre.  ASSISTANT:  Paul Dykes. Grant Ruts., M.D.  CONSULTS:  None.  BRIEF HISTORY:  The patient is a 70 -year-old female with a several-year history of left knee pain.  She has tried various conservative treatments including anti-inflammatory injections and has also undergone arthroscopic debridement in the past due to lack in improvement.  She is followed back up in the office where x-rays showed bone-in-bone deformities especially in the medial compartment and it is felt she would benefit from undergoing a total knee replacement arthroplasty.  The risks and benefits of the procedure were discussed with the patient and she has accepted these going into surgery.  LABORATORY DATA:  CBC on admission hemoglobin of 14.6, hematocrit of 43.0, white cell count 8.3, red cell count 4.90.  Postop hemoglobin and hematocrit were 12.1 and 36.1.  Hemoglobin did continue to climb down to stabilize the level at 9.8 and 28.4.  PT/PTT on admission were 12.9 and 28 respectively. Serial pro-time is followed per Coumadin protocol.  Last note of PT and INR was 20.5 and 2.4.   Chem panel on admission, elevated glucose of 152, remaining chem panel all within normal limits.  _____  are followed throughout the hospital course.  Postoperative sodium dropped to a level of 134, postoperative potassium dropped to a level of 2.3.  Chloride dropped to 88, CO2 elevated to 37, calcium dropped to 8.2.  _____ were followed.  The patient underwent potassium supplements.  The potassium came back 2.5, then to 3.2 and was last noted at 3.7 prior to discharge, back within the normal limits. Urinalysis on admission was negative.  Blood type O positive.  X-RAYS:  Chest x-ray dated May 21, 2000, no evidence of acute pulmonary disease.  Left knee film shows arthritis, particularly in the median patellofemoral compartments.  Postoperative film left total knee replacement arthroplasty in satisfactory position.  EKG:  Dated May 21, 2000, normal sinus rhythm, possible inferior infarct, age indeterminate.  ST and T wave abnormalities consider lateral ischemia, no old traces to compare this to.  Confirmed by Dr. Rollene Rotunda.  HOSPITAL COURSE: The patient was admitted to [facility] and taken to the OR and underwent the above-stated procedure without complications.  The patient tolerated the procedure well and later was transferred to the recovery room and into _____ to continue postoperative care.  Vital signs are followed with the patient throughout the hospital  course.  The patient was placed on PC analgesia for pain control following surgery.  She was weaned off the PCA to p.o. analgesics throughout the hospital course.  Dressing changes were initiated on postop day two.  The wound was healing well.  It was noted the potassium had dropped down to a level of 2.3 and she was given three runs of K.  However, her potassium only responded up to 2.5.  Therefore she was given three further runs of K for a total of six during the hospital course.  She was also placed on  potassium supplements by mouth.  The potassium did improve and came back up to 3.2 and was last noted at 3.7.  It was noted that she was on two diuretic medications of furosemide and the hydrochlorothiazide were held initially.  The hydrochlorothiazide was resumed throughout the hospital course, however, the furosemide was held during the hospital course and was not resumed at the time of discharge.  Physical therapy was consulted with gait training ambulation.  The patient was slow to progress initially with physical therapy, however, she did show progress.  Initially she only ambulating approximately 20 feet by postop day two, however, she did increase up greater than 135 feet by postop day four. PT continued with gait training ambulation and she was weight bearing as tolerated.  OT was consulted for ADLs.  By postop day five the patient was ambulating quite well.  Physical therapy continued to work with the patient and it was felt the patient could be transferred home later that evening.  ______ 1.  The patient was discharged home on June 02, 2000. 2.  Discharge diagnoses please see above. 3.  Discharge meds, Percocet #40 p.r.n. pain, Skelaxin 400 mg #40 p.r.n. spasm, Coumadin as per pharmacy protocol.  She will be placed on K-Dur 20 mEq 1 p.o. q.d. a months supply is provided, Chromagen Forte daily.  DISCHARGE DIET:  Low-sodium diet, low-cholesterol diet.  ACTIVITY:  Continue with outpatient physical therapy for total knee protocol. She is weight bearing as tolerated.  Equipment has been ordered prior to discharge.  OT will be consulted for ADLs prior to discharge.  The patient may start showering at the time of discharge.  FOLLOW-UP:  The patient is to follow up with Dr. Darrelyn Hillock two weeks from date of surgery.  She will be asked to follow up with Dr. Jeannetta Nap after her discharge from the hospital.  She is to follow up for reevaluation and follow-up of the low potassium.  She is  discharged out on potassium  supplements.  She was not on this prior to admission.  We will have her follow back up for evaluation for possible need for chronic supplementation.  DISPOSITION:  Home.  CONDITION UPON DISCHARGE:  Improved. DD:  06/02/00 TD:  06/02/00 Job: [id number]

## 2011-04-18 NOTE — Consult Note (Signed)
NAME:  [name], [name]              ACCOUNT NO.:  [id number]   MEDICAL RECORD NO.:  [id number]          PATIENT TYPE:  INP   LOCATION:  1407                         FACILITY:  [facility]   PHYSICIAN:  Sherin Quarry, MD      DATE OF BIRTH:  1941-06-02   DATE OF CONSULTATION:  03/11/2006  DATE OF DISCHARGE:                                   CONSULTATION   HISTORY OF PRESENT ILLNESS:  [name] is a 70 year old lady who  underwent an L4-5 decompression surgery today by Dr. Darrelyn Hillock. During the  course of the surgery, the patient's blood pressure was closely monitored  and during the later part of the operation, her blood pressure was  consistently in the range of 80 to 95 over 50 to 60. Pulse rate was 70.  Oxygenation was good. The patient was given apparently a total of 2 liters  of lactated ringers and was placed on Neo-Synephrine to maintain a blood  pressure of 90/60. Postoperatively, she was monitored in the recovery room  and then was transferred to the Intensive Care Unit. She is currently awake  and alert. She specifically denies shortness of breath, orthopnea, PND, sub-  sternal chest discomfort, abdominal pain, nausea, or vomiting. Her  hemoglobin was monitored postoperatively and was found to be 10. Her  preoperative hemoglobin was 12.2. An electrocardiogram was obtained  postoperatively. This shows normal sinus rhythm with no significant ischemic  changes.   Of note, is that [name] has a history of hypertension and is normally  maintained on Vasotec 20 mg daily and Diazide 75/50 1/2 tablet daily. She  took her Vasotec 20 mg tablet this morning preoperatively. She states that  she did not take any other medicines this morning. Also of note, is that  preoperatively the patient was seen by Dr. Eldridge Dace of [facility] and  underwent an Adenosine Cardiolite study. This study was completely negative  and Dr. Hoyle Barr only recommendation was that the patient receive  Atenolol  25 mg daily during the preoperative period. It is my understanding that she  did not receive the Atenolol today, at least as far as the patient knows.   PAST MEDICAL HISTORY:  The patient's current medical problems are reviewed  below.  1.  Hypertension. She states that she has a long standing history of      hypertension and is very compliant with her medications. She states that      her blood pressure is monitored regularly by Dr. Azucena Cecil in his office      and that it is generally within the desirable range. She states that she      has no history of heart disease or stroke.  2.  Diabetes. The patient's diabetes is currently managed with oral agents      as described below. She says that her blood sugars are up and down but      are almost always in the range of 130 to 160. She has had no recent      problems with excessive thirst, urinary frequency, or nocturia. Her  preoperative blood sugar was 160 on her C-met.   CURRENT MEDICATIONS:  1.  Vasotec 20 mg daily.  2.  Robaxin 500 mg t.i.d.  3.  Glipizide 20 mg daily.  4.  Metformin 1 gram daily.  5.  Dyazide 75/50 1/2 tablet daily.  6.  Lipitor 80 mg daily.  7.  Xanax 0.5 mg b.i.d.  8.  Zoloft 100 mg 2 tablets in the morning.  9.  Zyrtec 10 mg daily.  10. Ultram 1 or 2 tablets every 4 to 6 hours p.r.n. pain.  11. Atenolol 25 mg daily, which was started as preoperative beta blocker      prophylaxis.   ALLERGIES:  CODEINE, MORPHINE, VICODIN, NOVOCAIN, ASPIRIN, OXYCODONE, AND  DARVOCET.   PAST MEDICAL HISTORY:  As described above are:  1.  Hypertension.  2.  Diabetes.  3.  Degenerative joint disease.  4.  Chronic pain.   PAST SURGICAL HISTORY:  1.  The patient has had a previous cholecystectomy.  2.  She has had multiple hernia repairs, the most recent being in July of      2006. Parenthetically, she did not seem to have had any problems      postoperatively after her surgery in July.  3.  She has also had a  cataract extraction.  4.  Hysterectomy.   FAMILY HISTORY:  Her mother died of unclear cause, this year. She has a  sister with a history of lung cancer and there is a significant family  history of hypertension and coronary artery disease in the family.   SOCIAL HISTORY:  The patient lives by herself. She does not smoke. She dose  not abuse alcohol or drugs.   REVIEW OF SYSTEMS:  Difficult to attain from the patient postoperatively and  questions were somewhat limited.  HEENT:  She denied headache or dizziness.  EYES:  Denies visual blurring or diplopia. ENT:  She denied earache, sinus  pain, or sore throat. CHEST:  She denied coughing, wheezing, or chest  congestion. CARDIOVASCULAR:  She denied chest pain, orthopnea, PND, or ankle  edema. GASTROINTESTINAL:  She denied nausea, vomiting, abdominal pain,  change in bowel habits, melena, or hematochezia. GENITOURINARY:  Denied  dysuria or urinary frequency. NEUROLOGIC:  She denied any history of seizure  or stroke. ENDO:  See above.   PHYSICAL EXAMINATION:  GENERAL:  The patient is seen immediately  postoperatively.  HEENT:  Examination within normal limits.  CHEST:  Clear.  CARDIOVASCULAR:  Normal S1 and S2. There are no rubs, murmurs, or gallops.  ABDOMEN:  Soft. Normal bowel sounds. No masses or tenderness.  EXTREMITIES:  Normal examination.   IMPRESSION:  1.  Postoperative hypotension. This is probably multi-factorial, related to      relative volume contraction and long standing diuretic therapy as well      as taking her Vasotec this morning.  2.  History of hypertension.  3.  Diabetes.  4.  Hyperlipidemia.  5.  Chronic anxiety.  6.  Spinal stenosis, status post L4-5 decompression.  7.  History of multiple allergies including CODEINE, MORPHINE, VICODIN,      ASPIRIN, OXYCODONE, DARVOCET.  8.  History of cholecystectomy, hernia repair, and hysterectomy. 9.  Status post normal Adenosine Cardiolite study done recently.    PLAN:  Will administer intravenous fluid challenge. Will hold blood pressure  mediations. Will obtain serial cardiac enzymes to rule out myocardial  infarction. The patient will be monitored in the Intensive Care Unit  setting. Will  manage her diabetes with sliding scale insulin coverage. Will  hold non-essential medications. Hopefully, the Neo-Synephrine can tapered  and discontinued fairly shortly.           ______________________________  Sherin Quarry, MD     SY/MEDQ  D:  03/11/2006  T:  03/11/2006  Job:  [id number]   cc:   Windy Fast A. Darrelyn Hillock, M.D.  Fax: [phone]   Corky Crafts, MD  Fax: [phone]   Tally Joe, M.D.  Fax: [phone]

## 2011-08-25 LAB — COMPREHENSIVE METABOLIC PANEL
ALT: 33
Albumin: 3.9
BUN: 8
CO2: 28
Chloride: 99
Creatinine, Ser: 0.72
Glucose, Bld: 225 — ABNORMAL HIGH
Potassium: 4.1
Sodium: 138
Total Bilirubin: 1.1
Total Protein: 7.2

## 2011-08-25 LAB — URINALYSIS, ROUTINE W REFLEX MICROSCOPIC
Bilirubin Urine: NEGATIVE
Glucose, UA: NEGATIVE
Ketones, ur: NEGATIVE
Specific Gravity, Urine: 1.009
pH: 6.5

## 2011-08-25 LAB — CBC
HCT: 30.1 — ABNORMAL LOW
HCT: 27.4 — ABNORMAL LOW
HCT: 28 — ABNORMAL LOW
Hemoglobin: 10.2 — ABNORMAL LOW
Hemoglobin: 9.5 — ABNORMAL LOW
Hemoglobin: 9.6 — ABNORMAL LOW
MCHC: 33.8
MCHC: 34.9
MCV: 87.6
MCV: 87.1
MCV: 88.4
Platelets: 275
RBC: 3.41 — ABNORMAL LOW
RBC: 4.41
RBC: 3.15 — ABNORMAL LOW
RDW: 16.3 — ABNORMAL HIGH
RDW: 17 — ABNORMAL HIGH
RDW: 16.7 — ABNORMAL HIGH
WBC: 9.6
WBC: 8.3

## 2011-08-25 LAB — PROTIME-INR
INR: 1
INR: 1
INR: 1.7 — ABNORMAL HIGH
Prothrombin Time: 23.3 — ABNORMAL HIGH

## 2011-08-25 LAB — BASIC METABOLIC PANEL
BUN: 6
CO2: 30
CO2: 28
CO2: 30
Calcium: 8.7
Calcium: 8.6
Chloride: 101
Chloride: 102
Creatinine, Ser: 0.72
GFR calc Af Amer: >60
GFR calc Af Amer: >60
GFR calc non Af Amer: >60
GFR calc non Af Amer: >60
Glucose, Bld: 157 — ABNORMAL HIGH
Glucose, Bld: 162 — ABNORMAL HIGH
Potassium: 4.4
Potassium: 3.8
Potassium: 3.9
Sodium: 138
Sodium: 139

## 2011-08-25 LAB — TYPE AND SCREEN

## 2011-08-25 LAB — DIFFERENTIAL
Basophils Relative: 0
Lymphs Abs: 1.8
Neutro Abs: 7.2

## 2011-09-09 LAB — POCT I-STAT 4, (NA,K, GLUC, HGB,HCT)
Glucose, Bld: 190 — ABNORMAL HIGH
Hemoglobin: 12.9
Potassium: 3.8
Sodium: 140

## 2012-12-22 ENCOUNTER — Ambulatory Visit: Payer: Self-pay | Admitting: Endocrinology

## 2013-01-19 ENCOUNTER — Encounter: Payer: Self-pay | Admitting: Endocrinology

## 2013-01-19 ENCOUNTER — Telehealth: Payer: Self-pay | Admitting: Endocrinology

## 2013-01-19 ENCOUNTER — Ambulatory Visit (INDEPENDENT_AMBULATORY_CARE_PROVIDER_SITE_OTHER): Payer: Medicare Other | Admitting: Endocrinology

## 2013-01-19 VITALS — BP 132/82 | HR 84 | Wt 192.0 lb

## 2013-01-19 DIAGNOSIS — F411 Generalized anxiety disorder: Secondary | ICD-10-CM

## 2013-01-19 DIAGNOSIS — E119 Type 2 diabetes mellitus without complications: Secondary | ICD-10-CM | POA: Insufficient documentation

## 2013-01-19 DIAGNOSIS — I10 Essential (primary) hypertension: Secondary | ICD-10-CM

## 2013-01-19 LAB — HEMOGLOBIN A1C: Hgb A1c MFr Bld: 10.3 % — ABNORMAL HIGH (ref 4.6–6.5)

## 2013-01-19 LAB — GLUCOSE, RANDOM: Glucose, Bld: 300 mg/dL — ABNORMAL HIGH (ref 70–99)

## 2013-01-19 NOTE — Telephone Encounter (Signed)
error 

## 2013-01-19 NOTE — Patient Instructions (Addendum)
good diet and exercise habits significanly improve the control of your diabetes.  please let me know if you wish to be referred to a dietician.  high blood sugar is very risky to your health.  you should see an eye doctor every year.  You are at higher than average risk for pneumonia and hepatitis-B.  You should be vaccinated against both.   controlling your blood pressure and cholesterol drastically reduces the damage diabetes does to your body.  this also applies to quitting smoking.  please discuss these with your doctor.  you should take an aspirin every day, unless you have been advised by a doctor not to. check your blood sugar once a day.  vary the time of day when you check, between before the 3 meals, and at bedtime.  also check if you have symptoms of your blood sugar being too high or too low.  please keep a record of the readings and bring it to your next appointment here.  please call us sooner if your blood sugar goes below 70, or if you have a lot of readings over 200.   blood tests are being requested for you today.  We'll contact you with results.  Please check your own blood sugar right after your blood is drawn today, so we can compare your meter to the lab.    based on the results, i may advise you to add  "bromocriptine," to help your blood sugar. It has possible side effects of nausea and dizziness.  These go away with time.  You can avoid these by taking it at bedtime, and by taking just take 1/2 pill for the first week.   Please come back for a follow-up appointment in 1 month.

## 2013-01-19 NOTE — Progress Notes (Signed)
°  Subjective:    Patient ID: [name], female    DOB: 1941-10-26, 72 y.o.   MRN: [id number]  HPI pt states 21 years h/o dm.  she is unaware of any chronic complications.  she has never been on insulin.  pt says her diet and exercise are "ok." Pt states of severe arthralgias throughout the body, but no assoc numbness. Pt says the metformin causes nausea.  She says cbg's vary from 40-300.   Past Medical History  Diagnosis Date   Arthritis    Depression    Diabetes mellitus without complication    Hypertension     Past Surgical History  Procedure Laterality Date   Abdominal hysterectomy     Gallbladder surgery  2005    History   Social History   Marital Status: Unknown    Spouse Name: N/A    Number of Children: N/A   Years of Education: N/A   Occupational History   Not on file.   Social History Main Topics   Smoking status: Not on file   Smokeless tobacco: Not on file   Alcohol Use: Not on file   Drug Use: Not on file   Sexually Active: Not on file   Other Topics Concern   Not on file   Social History Narrative   No narrative on file    No current outpatient prescriptions on file prior to visit.   No current facility-administered medications on file prior to visit.    Not on File  No family history on file. DM: father BP 132/82   Pulse 84   Wt 192 lb (87.091 kg)   SpO2 93%  Review of Systems denies weight loss, blurry vision, chest pain, sob, n/v, urinary frequency, memory loss, and menopausal sxs.  She has headache, depression, excessive diaphoresis, easy bruising, leg cramps, and rhinorrhea.    Objective:   Physical Exam VS: see vs page GEN: no distress HEAD: head: no deformity eyes: no periorbital swelling, no proptosis external nose and ears are normal mouth: no lesion seen NECK: supple, thyroid is not enlarged CHEST WALL: no deformity LUNGS:  Clear to auscultation CV: reg rate and rhythm, no murmur ABD: abdomen is soft,  nontender.  no hepatosplenomegaly.  not distended.  no hernia MUSCULOSKELETAL: muscle bulk and strength are grossly normal.  no obvious joint swelling.  gait is normal and steady EXTEMITIES: no deformity.  no ulcer on the feet.  feet are of normal color and temp.  no edema PULSES: dorsalis pedis intact bilat.  no carotid bruit NEURO:  cn 2-12 grossly intact.   readily moves all 4's.  sensation is intact to touch on the feet SKIN:  Normal texture and temperature.  No rash or suspicious lesion is visible.   NODES:  None palpable at the neck PSYCH: alert, oriented x3.  Does not appear anxious nor depressed.  Lab Results  Component Value Date   HGBA1C 10.3* 01/19/2013      Assessment & Plan:  Type 2 DM, needs increased rx Arthralgias.  These limit exercise rx of DM Depression.  This also complicates rx of DM

## 2013-01-20 ENCOUNTER — Telehealth: Payer: Self-pay | Admitting: Endocrinology

## 2013-01-20 DIAGNOSIS — I10 Essential (primary) hypertension: Secondary | ICD-10-CM | POA: Insufficient documentation

## 2013-01-20 DIAGNOSIS — F411 Generalized anxiety disorder: Secondary | ICD-10-CM | POA: Insufficient documentation

## 2013-01-20 MED ORDER — BROMOCRIPTINE MESYLATE 2.5 MG PO TABS: 2.5000 mg | ORAL_TABLET | Freq: Every day | ORAL | Status: DC

## 2013-01-20 NOTE — Telephone Encounter (Signed)
The patient called to state that she did not understand everything left on voicemail. Advised patient that her blood sugar with our lab was 300 mcg/dl yesterday compared to her meter yesterday which read 302 mcg/dl.  The patient was advised that Dr. Everardo All added Bromocriptine to medicine list and she should take 1/2 pill at bedtime for first week and then resume normal dosing the next week per office note from 01/19/13.  The patient will call back with any further questions.

## 2013-01-21 ENCOUNTER — Telehealth: Payer: Self-pay | Admitting: Endocrinology

## 2013-01-21 MED ORDER — BROMOCRIPTINE MESYLATE 2.5 MG PO TABS: 2.5000 mg | ORAL_TABLET | Freq: Every day | ORAL | Status: DC

## 2013-01-21 NOTE — Telephone Encounter (Signed)
The patient would like the rx for Bromocriptine sent to the [facility] at [facility].  The patient may be reached at [phone] if needed.

## 2013-02-15 ENCOUNTER — Ambulatory Visit: Payer: Medicare Other | Admitting: Endocrinology

## 2013-02-16 ENCOUNTER — Ambulatory Visit: Payer: Medicare Other | Admitting: Endocrinology

## 2013-03-09 ENCOUNTER — Ambulatory Visit (INDEPENDENT_AMBULATORY_CARE_PROVIDER_SITE_OTHER): Payer: Medicare Other | Admitting: Endocrinology

## 2013-03-09 ENCOUNTER — Encounter: Payer: Self-pay | Admitting: Endocrinology

## 2013-03-09 VITALS — BP 126/78 | HR 74 | Wt 189.0 lb

## 2013-03-09 DIAGNOSIS — E119 Type 2 diabetes mellitus without complications: Secondary | ICD-10-CM

## 2013-03-09 MED ORDER — PIOGLITAZONE HCL 45 MG PO TABS: 45.0000 mg | ORAL_TABLET | Freq: Every day | ORAL | Status: DC

## 2013-03-09 MED ORDER — METFORMIN HCL ER 500 MG PO TB24: 1000.0000 mg | ORAL_TABLET | Freq: Two times a day (BID) | ORAL | Status: DC

## 2013-03-09 NOTE — Progress Notes (Signed)
Subjective:    Patient ID: [name], female    DOB: 04/30/1941, 72 y.o.   MRN: [id number]  HPI Pt returns for f/u of type 2 DM (no known complications; she takes 4 oral agents).  she brings a record of her cbg's which i have reviewed today.  It varies from 122-233.  It is in general higher as the day goes on.  She says she wants to exhaust all orals prior to taking insulin.   Past Medical History  Diagnosis Date   Arthritis    Depression    Diabetes mellitus without complication    Hypertension     Past Surgical History  Procedure Laterality Date   Abdominal hysterectomy     Gallbladder surgery  2005   Back surgery  2006   Knee surgery  2001/2005   Hernia repair      History   Social History   Marital Status: Unknown    Spouse Name: N/A    Number of Children: N/A   Years of Education: N/A   Occupational History   Not on file.   Social History Main Topics   Smoking status: Former Smoker   Smokeless tobacco: Not on file   Alcohol Use: Not on file   Drug Use: Not on file   Sexually Active: Not on file   Other Topics Concern   Not on file   Social History Narrative   No narrative on file    Current Outpatient Prescriptions on File Prior to Visit  Medication Sig Dispense Refill   acetaminophen (TYLENOL) 325 MG tablet Take 650 mg by mouth every 6 (six) hours as needed for pain.       ALPRAZolam (XANAX) 1 MG tablet Take 1 mg by mouth at bedtime as needed for sleep.       atenolol (TENORMIN) 25 MG tablet Take 25 mg by mouth daily.       bromocriptine (PARLODEL) 2.5 MG tablet Take 1 tablet (2.5 mg total) by mouth daily.  30 tablet  11   enalapril (VASOTEC) 5 MG tablet Take 5 mg by mouth daily.       glipiZIDE (GLUCOTROL) 10 MG tablet Take 10 mg by mouth 2 (two) times daily before a meal.       methocarbamol (ROBAXIN) 500 MG tablet Take 500 mg by mouth 3 (three) times daily.       sitaGLIPtin (JANUVIA) 100 MG tablet Take 100 mg by mouth  daily.       traMADol (ULTRAM) 50 MG tablet Take 50 mg by mouth every 6 (six) hours as needed for pain.       No current facility-administered medications on file prior to visit.    Not on File  Family History  Problem Relation Age of Onset   Arthritis Mother    Hyperlipidemia Mother    Heart disease Mother    Hypertension Mother    Diabetes Mother    Arthritis Father    Hyperlipidemia Father    Heart disease Father    Hypertension Father    Diabetes Father    Alcohol abuse Sister    Hypertension Sister    Cancer Sister     BP 126/78   Pulse 74   Wt 189 lb (85.73 kg)   SpO2 97%  Review of Systems denies hypoglycemia.      Objective:   Physical Exam VITAL SIGNS:  See vs page GENERAL: no distress Ext: no edema.    At last ov, cbg  was 302 (simultaneous lab glucose was 300)     Assessment & Plan:  Type 2 DM: improved, based on cbg's.

## 2013-03-09 NOTE — Patient Instructions (Addendum)
i have sent a prescription to your pharmacy, for "pioglitizone."  The side-effect of this is swelling of the legs.  Please call if this happens. check your blood sugar once a day.  vary the time of day when you check, between before the 3 meals, and at bedtime.  also check if you have symptoms of your blood sugar being too high or too low.  please keep a record of the readings and bring it to your next appointment here.  please call us sooner if your blood sugar goes below 70, or if you have a lot of readings over 200.  In particular, please call if the blood sugar goes below 70, so we can reduce the glipizide.  Please come back for a follow-up appointment in 6 weeks.

## 2013-04-20 ENCOUNTER — Ambulatory Visit: Payer: Medicare Other | Admitting: Endocrinology

## 2013-05-11 ENCOUNTER — Ambulatory Visit: Payer: Medicare Other | Admitting: Endocrinology

## 2013-05-18 ENCOUNTER — Ambulatory Visit (INDEPENDENT_AMBULATORY_CARE_PROVIDER_SITE_OTHER): Payer: Medicare Other | Admitting: Endocrinology

## 2013-05-18 ENCOUNTER — Encounter: Payer: Self-pay | Admitting: Endocrinology

## 2013-05-18 VITALS — BP 124/74 | HR 90 | Ht 61.0 in | Wt 197.0 lb

## 2013-05-18 DIAGNOSIS — E119 Type 2 diabetes mellitus without complications: Secondary | ICD-10-CM

## 2013-05-18 LAB — MICROALBUMIN / CREATININE URINE RATIO: Microalb Creat Ratio: 1.6 mg/g (ref 0.0–30.0)

## 2013-05-18 LAB — HEMOGLOBIN A1C: Hgb A1c MFr Bld: 7.3 % — ABNORMAL HIGH (ref 4.6–6.5)

## 2013-05-18 NOTE — Progress Notes (Signed)
Subjective:    Patient ID: [name], female    DOB: [birth year], 72 y.o.   MRN: [id number]  HPI Pt returns for f/u of type 2 DM (dx'ed 1993; He has mild if any neuropathy of the lower extremities, or other associated complications; she takes 5 oral agents).  she brings a record of her cbg's which i have reviewed today.  It varies from 102-160.  It is in general higher as the day goes on.  She says she wants to exhaust all orals prior to taking insulin.   Past Medical History  Diagnosis Date   Arthritis    Depression    Diabetes mellitus without complication    Hypertension     Past Surgical History  Procedure Laterality Date   Abdominal hysterectomy     Gallbladder surgery  2005   Back surgery  2006   Knee surgery  2001/2005   Hernia repair      History   Social History   Marital Status: Unknown    Spouse Name: N/A    Number of Children: N/A   Years of Education: N/A   Occupational History   Not on file.   Social History Main Topics   Smoking status: Former Smoker   Smokeless tobacco: Not on file   Alcohol Use: Not on file   Drug Use: Not on file   Sexually Active: Not on file   Other Topics Concern   Not on file   Social History Narrative   No narrative on file    Current Outpatient Prescriptions on File Prior to Visit  Medication Sig Dispense Refill   acetaminophen (TYLENOL) 325 MG tablet Take 650 mg by mouth every 6 (six) hours as needed for pain.       ALPRAZolam (XANAX) 1 MG tablet Take 1 mg by mouth at bedtime as needed for sleep.       atenolol (TENORMIN) 25 MG tablet Take 25 mg by mouth daily.       bromocriptine (PARLODEL) 2.5 MG tablet Take 1 tablet (2.5 mg total) by mouth daily.  30 tablet  11   enalapril (VASOTEC) 5 MG tablet Take 5 mg by mouth daily.       glipiZIDE (GLUCOTROL) 10 MG tablet Take 10 mg by mouth 2 (two) times daily before a meal.       metFORMIN (GLUCOPHAGE-XR) 500 MG 24 hr tablet Take 2 tablets (1,000  mg total) by mouth 2 (two) times daily.  120 tablet  11   methocarbamol (ROBAXIN) 500 MG tablet Take 500 mg by mouth 3 (three) times daily.       pioglitazone (ACTOS) 45 MG tablet Take 1 tablet (45 mg total) by mouth daily.  30 tablet  11   sitaGLIPtin (JANUVIA) 100 MG tablet Take 100 mg by mouth daily.       traMADol (ULTRAM) 50 MG tablet Take 50 mg by mouth every 6 (six) hours as needed for pain.       No current facility-administered medications on file prior to visit.    Not on File  Family History  Problem Relation Age of Onset   Arthritis Mother    Hyperlipidemia Mother    Heart disease Mother    Hypertension Mother    Diabetes Mother    Arthritis Father    Hyperlipidemia Father    Heart disease Father    Hypertension Father    Diabetes Father    Alcohol abuse Sister    Hypertension Sister  Cancer Sister     BP 124/74   Pulse 90   Ht 5\' 1"  (1.549 m)   Wt 197 lb (89.359 kg)   BMI 37.24 kg/m2   SpO2 98%  Review of Systems denies hypoglycemia, but she has gained a few lbs.      Objective:   Physical Exam VITAL SIGNS:  See vs page GENERAL: no distress  Lab Results  Component Value Date   HGBA1C 7.3* 05/18/2013      Assessment & Plan:  DM: control is much better.  We discussed the nine oral agents available for type 2 diabetes.  This regimen gives the best risk-benefit ratio.  If she can reduce a med, the glipizide is the one to reduce.

## 2013-05-18 NOTE — Patient Instructions (Addendum)
check your blood sugar once a day.  vary the time of day when you check, between before the 3 meals, and at bedtime.  also check if you have symptoms of your blood sugar being too high or too low.  please keep a record of the readings and bring it to your next appointment here.  please call us sooner if your blood sugar goes below 70, or if you have a lot of readings over 200.  In particular, please call if the blood sugar goes below 70, so we can reduce the glipizide.  Please come back for a follow-up appointment in 3 months.

## 2013-05-27 ENCOUNTER — Telehealth: Payer: Self-pay

## 2013-05-27 NOTE — Telephone Encounter (Signed)
Pt states cbg has been low 2 days ago 69 and this am 112 and had a headache this am thinks the Glipizide is causing this please advise [phone]

## 2013-05-27 NOTE — Telephone Encounter (Signed)
Pt advised and states an understanding

## 2013-05-27 NOTE — Telephone Encounter (Signed)
Reduce glipizide to qd

## 2013-06-01 ENCOUNTER — Other Ambulatory Visit: Payer: Self-pay

## 2013-06-01 MED ORDER — SITAGLIPTIN PHOSPHATE 100 MG PO TABS: 100.0000 mg | ORAL_TABLET | Freq: Every day | ORAL | Status: DC

## 2013-06-01 MED ORDER — GLIPIZIDE 10 MG PO TABS: 10.0000 mg | ORAL_TABLET | Freq: Two times a day (BID) | ORAL | Status: DC

## 2013-06-02 ENCOUNTER — Other Ambulatory Visit: Payer: Self-pay | Admitting: *Deleted

## 2013-06-02 MED ORDER — PIOGLITAZONE HCL 45 MG PO TABS: 45.0000 mg | ORAL_TABLET | Freq: Every day | ORAL | Status: DC

## 2013-06-02 MED ORDER — GLIPIZIDE 10 MG PO TABS: 10.0000 mg | ORAL_TABLET | Freq: Two times a day (BID) | ORAL | Status: DC

## 2013-06-02 MED ORDER — BROMOCRIPTINE MESYLATE 2.5 MG PO TABS: 2.5000 mg | ORAL_TABLET | Freq: Every day | ORAL | Status: DC

## 2013-06-02 MED ORDER — ACCU-CHEK NANO SMARTVIEW W/DEVICE KIT: 1.0000 | PACK | Freq: Every day | Status: DC

## 2013-06-02 MED ORDER — GLUCOSE BLOOD VI STRP: ORAL_STRIP | Status: DC

## 2013-06-02 MED ORDER — METFORMIN HCL ER 500 MG PO TB24: 1000.0000 mg | ORAL_TABLET | Freq: Two times a day (BID) | ORAL | Status: DC

## 2013-06-02 MED ORDER — ACCU-CHEK FASTCLIX LANCET KIT: 1.0000 | PACK | Freq: Every day | Status: DC

## 2013-06-02 MED ORDER — SITAGLIPTIN PHOSPHATE 100 MG PO TABS: 100.0000 mg | ORAL_TABLET | Freq: Every day | ORAL | Status: DC

## 2013-06-02 MED ORDER — ACCU-CHEK FASTCLIX LANCETS MISC: 4.0000 | Freq: Every day | Status: DC

## 2013-06-06 ENCOUNTER — Other Ambulatory Visit: Payer: Self-pay | Admitting: *Deleted

## 2013-06-06 MED ORDER — GLIPIZIDE ER 10 MG PO TB24: 10.0000 mg | ORAL_TABLET | Freq: Every day | ORAL | Status: DC

## 2013-06-06 MED ORDER — BROMOCRIPTINE MESYLATE 2.5 MG PO TABS: 2.5000 mg | ORAL_TABLET | Freq: Every day | ORAL | Status: DC

## 2013-06-06 MED ORDER — SITAGLIPTIN PHOSPHATE 100 MG PO TABS: 100.0000 mg | ORAL_TABLET | Freq: Every day | ORAL | Status: DC

## 2013-06-06 MED ORDER — PIOGLITAZONE HCL 45 MG PO TABS: 45.0000 mg | ORAL_TABLET | Freq: Every day | ORAL | Status: DC

## 2013-06-06 NOTE — Telephone Encounter (Signed)
rx did not go through. resending

## 2013-08-18 ENCOUNTER — Encounter: Payer: Self-pay | Admitting: Endocrinology

## 2013-08-18 ENCOUNTER — Ambulatory Visit (INDEPENDENT_AMBULATORY_CARE_PROVIDER_SITE_OTHER): Payer: Medicare Other | Admitting: Endocrinology

## 2013-08-18 VITALS — BP 132/80 | HR 76 | Ht 61.0 in | Wt 198.0 lb

## 2013-08-18 DIAGNOSIS — E119 Type 2 diabetes mellitus without complications: Secondary | ICD-10-CM

## 2013-08-18 LAB — HEMOGLOBIN A1C: Hgb A1c MFr Bld: 6.8 % — ABNORMAL HIGH (ref 4.6–6.5)

## 2013-08-18 MED ORDER — GLIPIZIDE ER 5 MG PO TB24: 5.0000 mg | ORAL_TABLET | Freq: Every day | ORAL | Status: DC

## 2013-08-18 NOTE — Patient Instructions (Addendum)
check your blood sugar once a day.  vary the time of day when you check, between before the 3 meals, and at bedtime.  also check if you have symptoms of your blood sugar being too high or too low.  please keep a record of the readings and bring it to your next appointment here.  please call us sooner if your blood sugar goes below 70, or if you have a lot of readings over 200.  blood tests are being requested for you today.  We'll contact you with results.  Please reduce the glipizide to 5 mg daily.   Please come back for a follow-up appointment in 4 months. Also, please reduce the metformin to 3 pills per day.

## 2013-08-18 NOTE — Progress Notes (Signed)
Subjective:    Patient ID: [name], female    DOB: 1941/09/09, 72 y.o.   MRN: [id number]  HPI Pt returns for f/u of type 2 DM (dx'ed 1993; she has mild if any neuropathy of the lower extremities, or other associated complications; she takes 5 oral agents).  She recently got another steroid injection into the neck.  This temporarily increased her cbg's.  Prior to that, she was having cbg's as low as the 60's.  she brings a record of her cbg's which i have reviewed today.  It varies from 64-147.  There is no trend throughout the day.  She says the metformin causes nausea.   Past Medical History  Diagnosis Date   Arthritis    Depression    Diabetes mellitus without complication    Hypertension     Past Surgical History  Procedure Laterality Date   Abdominal hysterectomy     Gallbladder surgery  2005   Back surgery  2006   Knee surgery  2001/2005   Hernia repair      History   Social History   Marital Status: Unknown    Spouse Name: N/A    Number of Children: N/A   Years of Education: N/A   Occupational History   Not on file.   Social History Main Topics   Smoking status: Former Smoker   Smokeless tobacco: Not on file   Alcohol Use: Not on file   Drug Use: Not on file   Sexual Activity: Not on file   Other Topics Concern   Not on file   Social History Narrative   No narrative on file    Current Outpatient Prescriptions on File Prior to Visit  Medication Sig Dispense Refill   ACCU-CHEK FASTCLIX LANCETS MISC 4 each by Does not apply route daily.  302 each  2   ACCU-CHEK SMARTVIEW test strip        acetaminophen (TYLENOL) 325 MG tablet Take 650 mg by mouth every 6 (six) hours as needed for pain.       ALPRAZolam (XANAX) 1 MG tablet Take 1 mg by mouth at bedtime as needed for sleep.       atenolol (TENORMIN) 25 MG tablet Take 25 mg by mouth daily.       Blood Glucose Monitoring Suppl (ACCU-CHEK NANO SMARTVIEW) W/DEVICE KIT 1 each by Does  not apply route daily.  1 kit  0   bromocriptine (PARLODEL) 2.5 MG tablet Take 1 tablet (2.5 mg total) by mouth daily.  90 tablet  2   enalapril (VASOTEC) 5 MG tablet Take 5 mg by mouth daily.       glucose blood (ACCU-CHEK SMARTVIEW) test strip Use as instructed  450 each  2   Lancets Misc. (ACCU-CHEK FASTCLIX LANCET) KIT 1 each by Does not apply route daily.  1 kit  0   methocarbamol (ROBAXIN) 500 MG tablet Take 500 mg by mouth 3 (three) times daily.       pioglitazone (ACTOS) 45 MG tablet Take 1 tablet (45 mg total) by mouth daily.  90 tablet  2   sertraline (ZOLOFT) 100 MG tablet        sitaGLIPtin (JANUVIA) 100 MG tablet Take 1 tablet (100 mg total) by mouth daily.  90 tablet  2   traMADol (ULTRAM) 50 MG tablet Take 50 mg by mouth every 6 (six) hours as needed for pain.       No current facility-administered medications on file prior to visit.  Not on File  Family History  Problem Relation Age of Onset   Arthritis Mother    Hyperlipidemia Mother    Heart disease Mother    Hypertension Mother    Diabetes Mother    Arthritis Father    Hyperlipidemia Father    Heart disease Father    Hypertension Father    Diabetes Father    Alcohol abuse Sister    Hypertension Sister    Cancer Sister    BP 132/80   Pulse 76   Ht 5\' 1"  (1.549 m)   Wt 198 lb (89.812 kg)   BMI 37.43 kg/m2   SpO2 97%  Review of Systems Denies LOC and weight change.    Objective:   Physical Exam VITAL SIGNS:  See vs page GENERAL: no distress  Lab Results  Component Value Date   HGBA1C 6.8* 08/18/2013      Assessment & Plan:  DM: overcontrolled, given this sulfonylurea-containing regimen. Nausea, due to DM

## 2013-12-14 ENCOUNTER — Telehealth: Payer: Self-pay

## 2013-12-14 NOTE — Telephone Encounter (Signed)
Pt informed.

## 2013-12-14 NOTE — Telephone Encounter (Signed)
Ok, let's address at [facility]

## 2013-12-14 NOTE — Telephone Encounter (Signed)
Pt called about A1C results. Pt states that she has not been taking her Bromcriptine because her blood sugars have been in the low 100's. Pt stated that she was worried to take it because of the low reading. Also, she states that she has has some puffiness in her hands and was told by Dr. Moreen Fowler to check to see if dosage for Actos should be lowered. Please advise,  Thanks!

## 2013-12-22 ENCOUNTER — Ambulatory Visit (INDEPENDENT_AMBULATORY_CARE_PROVIDER_SITE_OTHER): Payer: Medicare Other | Admitting: Endocrinology

## 2013-12-22 ENCOUNTER — Encounter: Payer: Self-pay | Admitting: Endocrinology

## 2013-12-22 VITALS — BP 130/94 | HR 75 | Temp 97.7°F | Ht 64.0 in | Wt 203.0 lb

## 2013-12-22 DIAGNOSIS — E119 Type 2 diabetes mellitus without complications: Secondary | ICD-10-CM

## 2013-12-22 MED ORDER — GLIPIZIDE ER 2.5 MG PO TB24: 2.5000 mg | ORAL_TABLET | Freq: Every day | ORAL | Status: DC

## 2013-12-22 NOTE — Patient Instructions (Addendum)
Please reduce the glipizide to 2.5 mg daily. Please come back for a follow-up appointment in 3 months.check your blood sugar once a day.  vary the time of day when you check, between before the 3 meals, and at bedtime.  also check if you have symptoms of your blood sugar being too high or too low.  please keep a record of the readings and bring it to your next appointment here.  please call us sooner if your blood sugar goes below 70, or if you have a lot of readings over 200.

## 2013-12-22 NOTE — Progress Notes (Signed)
Subjective:    Patient ID: [name], female    DOB: 05-12-1941, 73 y.o.   MRN: [id number]  HPI Pt returns for f/u of type 2 DM (dx'ed 1993; she has mild if any neuropathy of the lower extremities, or other associated complications; she takes 5 oral agents).  She stopped parlodel, due to hypoglycemia.  Her last steroid injection was in in December of 2014.  she brings a record of her cbg's which i have reviewed today.  It is approx 100, except for the 2 days after the steriod injection.   Past Medical History  Diagnosis Date   Arthritis    Depression    Diabetes mellitus without complication    Hypertension     Past Surgical History  Procedure Laterality Date   Abdominal hysterectomy     Gallbladder surgery  2005   Back surgery  2006   Knee surgery  2001/2005   Hernia repair      History   Social History   Marital Status: Unknown    Spouse Name: N/A    Number of Children: N/A   Years of Education: N/A   Occupational History   Not on file.   Social History Main Topics   Smoking status: Former Smoker   Smokeless tobacco: Not on file   Alcohol Use: Not on file   Drug Use: Not on file   Sexual Activity: Not on file   Other Topics Concern   Not on file   Social History Narrative   No narrative on file    Current Outpatient Prescriptions on File Prior to Visit  Medication Sig Dispense Refill   ACCU-CHEK FASTCLIX LANCETS [place] 4 each by Does not apply route daily.  302 each  2   ACCU-CHEK SMARTVIEW test strip        acetaminophen (TYLENOL) 325 MG tablet Take 650 mg by mouth every 6 (six) hours as needed for pain.       ALPRAZolam (XANAX) 1 MG tablet Take 1 mg by mouth at bedtime as needed for sleep.       atenolol (TENORMIN) 25 MG tablet Take 25 mg by mouth daily.       Blood Glucose Monitoring Suppl (ACCU-CHEK NANO SMARTVIEW) W/DEVICE KIT 1 each by Does not apply route daily.  1 kit  0   enalapril (VASOTEC) 5 MG tablet Take 5 mg by  mouth daily.       glucose blood (ACCU-CHEK SMARTVIEW) test strip Use as instructed  450 each  2   Lancets Misc. (ACCU-CHEK FASTCLIX LANCET) KIT 1 each by Does not apply route daily.  1 kit  0   metFORMIN (GLUCOPHAGE-XR) 500 MG 24 hr tablet Take 500 mg by mouth 3 (three) times daily.       methocarbamol (ROBAXIN) 500 MG tablet Take 500 mg by mouth 3 (three) times daily.       pioglitazone (ACTOS) 45 MG tablet Take 1 tablet (45 mg total) by mouth daily.  90 tablet  2   sertraline (ZOLOFT) 100 MG tablet        sitaGLIPtin (JANUVIA) 100 MG tablet Take 1 tablet (100 mg total) by mouth daily.  90 tablet  2   traMADol (ULTRAM) 50 MG tablet Take 50 mg by mouth every 6 (six) hours as needed for pain.       bromocriptine (PARLODEL) 2.5 MG tablet Take 1 tablet (2.5 mg total) by mouth daily.  90 tablet  2   No current facility-administered  medications on file prior to visit.    Not on File  Family History  Problem Relation Age of Onset   Arthritis Mother    Hyperlipidemia Mother    Heart disease Mother    Hypertension Mother    Diabetes Mother    Arthritis Father    Hyperlipidemia Father    Heart disease Father    Hypertension Father    Diabetes Father    Alcohol abuse Sister    Hypertension Sister    Cancer Sister     BP 130/94   Pulse 75   Temp(Src) 97.7 F (36.5 C) (Oral)   Ht 5' 4"  (1.626 m)   Wt 203 lb (92.08 kg)   BMI 34.83 kg/m2   SpO2 95%  Review of Systems Denies LOC.  She reports edema.    Objective:   Physical Exam VITAL SIGNS:  See vs page GENERAL: no distress   outside test results are reviewed: A1c=6.6    Assessment & Plan:  DM: overcontrolled Edema: this could preclude actos rx, but it is not confirmed on physical exam.  We'll hold-off for now.

## 2013-12-29 ENCOUNTER — Other Ambulatory Visit: Payer: Self-pay | Admitting: Endocrinology

## 2013-12-30 ENCOUNTER — Other Ambulatory Visit: Payer: Self-pay

## 2013-12-30 MED ORDER — SITAGLIPTIN PHOSPHATE 100 MG PO TABS: 100.0000 mg | ORAL_TABLET | Freq: Every day | ORAL | Status: DC

## 2014-01-12 ENCOUNTER — Other Ambulatory Visit: Payer: Self-pay | Admitting: *Deleted

## 2014-01-12 MED ORDER — GLIPIZIDE ER 2.5 MG PO TB24: 2.5000 mg | ORAL_TABLET | Freq: Every day | ORAL | Status: DC

## 2014-01-12 NOTE — Telephone Encounter (Signed)
Pt needed 90 day supply sent to [facility]. Done.

## 2014-01-17 ENCOUNTER — Other Ambulatory Visit: Payer: Self-pay | Admitting: Endocrinology

## 2014-03-22 ENCOUNTER — Ambulatory Visit: Payer: Medicare Other | Admitting: Endocrinology

## 2014-04-18 ENCOUNTER — Encounter: Payer: Self-pay | Admitting: Endocrinology

## 2014-04-18 ENCOUNTER — Ambulatory Visit (INDEPENDENT_AMBULATORY_CARE_PROVIDER_SITE_OTHER): Payer: Medicare Other | Admitting: Endocrinology

## 2014-04-18 VITALS — BP 118/70 | HR 74 | Temp 97.7°F | Ht 64.0 in | Wt 205.0 lb

## 2014-04-18 DIAGNOSIS — E119 Type 2 diabetes mellitus without complications: Secondary | ICD-10-CM

## 2014-04-18 MED ORDER — NATEGLINIDE 60 MG PO TABS: 60.0000 mg | ORAL_TABLET | Freq: Three times a day (TID) | ORAL | Status: DC

## 2014-04-18 NOTE — Progress Notes (Signed)
Subjective:    Patient ID: [name], female    DOB: 1941/10/25, 73 y.o.   MRN: [id number]  HPI Pt returns for f/u of type 2 DM (dx'ed 1993; she has mild if any neuropathy of the lower extremities, or other associated complications; she takes 4 oral agents; she has never taken insulin; she has never had pancreatitis, GDM, or severe hypoglycemia, or DKA).  Pt says she does not take the parlodel.  she brings a record of her cbg's which i have reviewed today.  It varies from 80-110.  There is no trend throughout the day.  Her last steriod injection was 2 mos ago.   Past Medical History  Diagnosis Date   Arthritis    Depression    Diabetes mellitus without complication    Hypertension     Past Surgical History  Procedure Laterality Date   Abdominal hysterectomy     Gallbladder surgery  2005   Back surgery  2006   Knee surgery  2001/2005   Hernia repair      History   Social History   Marital Status: Unknown    Spouse Name: N/A    Number of Children: N/A   Years of Education: N/A   Occupational History   Not on file.   Social History Main Topics   Smoking status: Former Smoker   Smokeless tobacco: Not on file   Alcohol Use: Not on file   Drug Use: Not on file   Sexual Activity: Not on file   Other Topics Concern   Not on file   Social History Narrative   No narrative on file    Current Outpatient Prescriptions on File Prior to Visit  Medication Sig Dispense Refill   ACCU-CHEK FASTCLIX LANCETS [place] 4 each by Does not apply route daily.  302 each  2   ACCU-CHEK SMARTVIEW test strip        acetaminophen (TYLENOL) 325 MG tablet Take 650 mg by mouth every 6 (six) hours as needed for pain.       ACTOS 45 MG tablet Take 1 tablet by mouth  daily  90 tablet  1   ALPRAZolam (XANAX) 1 MG tablet Take 1 mg by mouth at bedtime as needed for sleep.       atenolol (TENORMIN) 25 MG tablet Take 25 mg by mouth daily.       Blood Glucose Monitoring Suppl  (ACCU-CHEK NANO SMARTVIEW) W/DEVICE KIT 1 each by Does not apply route daily.  1 kit  0   enalapril (VASOTEC) 5 MG tablet Take 5 mg by mouth daily.       glucose blood (ACCU-CHEK SMARTVIEW) test strip Use as instructed  450 each  2   Lancets Misc. (ACCU-CHEK FASTCLIX LANCET) KIT 1 each by Does not apply route daily.  1 kit  0   metFORMIN (GLUCOPHAGE-XR) 500 MG 24 hr tablet Take 500 mg by mouth 3 (three) times daily.       methocarbamol (ROBAXIN) 500 MG tablet Take 500 mg by mouth 3 (three) times daily.       sertraline (ZOLOFT) 100 MG tablet        sitaGLIPtin (JANUVIA) 100 MG tablet Take 1 tablet (100 mg total) by mouth daily.  90 tablet  2   traMADol (ULTRAM) 50 MG tablet Take 50 mg by mouth every 6 (six) hours as needed for pain.       No current facility-administered medications on file prior to visit.  Not on File  Family History  Problem Relation Age of Onset   Arthritis Mother    Hyperlipidemia Mother    Heart disease Mother    Hypertension Mother    Diabetes Mother    Arthritis Father    Hyperlipidemia Father    Heart disease Father    Hypertension Father    Diabetes Father    Alcohol abuse Sister    Hypertension Sister    Cancer Sister     BP 118/70   Pulse 74   Temp(Src) 97.7 F (36.5 C) (Oral)   Ht 5' 4"  (1.626 m)   Wt 205 lb (92.987 kg)   BMI 35.17 kg/m2   SpO2 97%  Review of Systems She denies weight change, but she has chronic nausea.    Objective:   Physical Exam VITAL SIGNS:  See vs page GENERAL: no distress Pulses: dorsalis pedis intact bilat.   Feet: no deformity. normal color and temp.  no edema Skin:  no ulcer on the feet.   Neuro: sensation is intact to touch on the feet  Lab Results  Component Value Date   HGBA1C 6.8* 08/18/2013      Assessment & Plan:  DM: overcontrolled, given this sulfonylurea-containing regimen, and her advanced age.   Nausea, new: this limits rx options for DM Anxiety: rx of this can mask sxs of  hypoglycemia.   Patient Instructions  Please stop taking the glipizide, and stay-off the bromocriptine.   Please come back for a follow-up appointment in 3 months. check your blood sugar once a day.  vary the time of day when you check, between before the 3 meals, and at bedtime.  also check if you have symptoms of your blood sugar being too high or too low.  please keep a record of the readings and bring it to your next appointment here.  please call us sooner if your blood sugar goes below 70, or if you have a lot of readings over 200.  i have sent a prescription to your pharmacy, to add "nateglinide."

## 2014-04-18 NOTE — Patient Instructions (Addendum)
Please stop taking the glipizide, and stay-off the bromocriptine.   Please come back for a follow-up appointment in 3 months. check your blood sugar once a day.  vary the time of day when you check, between before the 3 meals, and at bedtime.  also check if you have symptoms of your blood sugar being too high or too low.  please keep a record of the readings and bring it to your next appointment here.  please call us sooner if your blood sugar goes below 70, or if you have a lot of readings over 200.  i have sent a prescription to your pharmacy, to add "nateglinide."

## 2014-04-21 ENCOUNTER — Telehealth: Payer: Self-pay | Admitting: *Deleted

## 2014-04-21 NOTE — Telephone Encounter (Signed)
It would be better to take an alternative.  Ok to send rx?

## 2014-04-21 NOTE — Telephone Encounter (Signed)
[place], i agree.

## 2014-04-21 NOTE — Telephone Encounter (Signed)
See below and please advise, Thanks!

## 2014-04-21 NOTE — Telephone Encounter (Signed)
Called pt. Pt states that since she paid for her medication she would like to get as much use out of it that she can. Pt wanted to know if she could stay on the medication until Tuesday and see if the drowsiness subsides. Please advise, Thanks!

## 2014-04-21 NOTE — Telephone Encounter (Signed)
Pt advised.

## 2014-04-21 NOTE — Telephone Encounter (Signed)
Patient called about a new medication she is taking, it's Nateglinide 60 mg, she said Dr. Loanne Drilling wanted her to take it 3 times a day, she said it is making her very drowsy. She wants to know if he wants her to continue taking it or drop down to 2 pills a day.  If he wants her to take it 2 meals a day, what meals does he want her to take it with?

## 2014-04-26 ENCOUNTER — Telehealth: Payer: Self-pay

## 2014-04-26 MED ORDER — ACARBOSE 25 MG PO TABS: 25.0000 mg | ORAL_TABLET | Freq: Three times a day (TID) | ORAL | Status: DC

## 2014-04-26 NOTE — Telephone Encounter (Signed)
Noted, pt advised.

## 2014-04-26 NOTE — Telephone Encounter (Signed)
Pt called stating that her drowsiness and dizziness still has not gotten any better and she suspects that it is coming from the nateglinide. Pt states that in previous ov that the medication Acarbose was suggested as an alternative. Pt states that she would like to try this. Please advise, Thanks!

## 2014-04-26 NOTE — Telephone Encounter (Signed)
Ok, please change nateglinide to acarbose.  i have sent a prescription to your pharmacy.  If you have intestinal symptoms, try just once a day, then build it up slowly to 3 times a day.

## 2014-05-01 ENCOUNTER — Telehealth: Payer: Self-pay

## 2014-05-01 NOTE — Telephone Encounter (Signed)
Pt called stating the Acarbose has been causing a lot of gas. Blood sugar reading have been 140-160. Pt states she started take 3 tabs daily on 04/27/2014. Wanted to know if she could reduce to 1 tab. Please advise, Thanks!

## 2014-05-01 NOTE — Telephone Encounter (Signed)
Ok to reduce to 1/day

## 2014-05-01 NOTE — Telephone Encounter (Signed)
What can she take if this doesn't reduce the amount of gas she is having

## 2014-05-01 NOTE — Telephone Encounter (Signed)
Pt advised that this dosage should help with the gas she is having.

## 2014-05-15 ENCOUNTER — Other Ambulatory Visit: Payer: Self-pay | Admitting: Endocrinology

## 2014-05-16 ENCOUNTER — Other Ambulatory Visit: Payer: Self-pay | Admitting: *Deleted

## 2014-05-16 MED ORDER — GLUCOSE BLOOD VI STRP: ORAL_STRIP | Status: DC

## 2014-05-18 ENCOUNTER — Other Ambulatory Visit: Payer: Self-pay | Admitting: *Deleted

## 2014-05-18 MED ORDER — ACCU-CHEK FASTCLIX LANCET KIT: PACK | Status: DC

## 2014-05-22 ENCOUNTER — Other Ambulatory Visit: Payer: Self-pay

## 2014-05-22 MED ORDER — ACCU-CHEK FASTCLIX LANCETS MISC: 4.0000 | Freq: Every day | Status: DC

## 2014-07-19 ENCOUNTER — Ambulatory Visit (INDEPENDENT_AMBULATORY_CARE_PROVIDER_SITE_OTHER): Payer: Medicare Other | Admitting: Endocrinology

## 2014-07-19 ENCOUNTER — Encounter: Payer: Self-pay | Admitting: Endocrinology

## 2014-07-19 VITALS — BP 118/70 | HR 69 | Temp 98.7°F | Ht 64.0 in | Wt 205.0 lb

## 2014-07-19 DIAGNOSIS — E119 Type 2 diabetes mellitus without complications: Secondary | ICD-10-CM

## 2014-07-19 LAB — HEMOGLOBIN A1C: Hgb A1c MFr Bld: 7.7 % — ABNORMAL HIGH (ref 4.6–6.5)

## 2014-07-19 LAB — MICROALBUMIN / CREATININE URINE RATIO
CREATININE, U: 70.8 mg/dL
Microalb Creat Ratio: 1.6 mg/g (ref 0.0–30.0)
Microalb, Ur: 1.1 mg/dL (ref 0.0–1.9)

## 2014-07-19 MED ORDER — ACARBOSE 25 MG PO TABS: 25.0000 mg | ORAL_TABLET | Freq: Two times a day (BID) | ORAL | Status: DC

## 2014-07-19 NOTE — Patient Instructions (Addendum)
Please come back for a follow-up appointment in 3 months. check your blood sugar once a day.  vary the time of day when you check, between before the 3 meals, and at bedtime.  also check if you have symptoms of your blood sugar being too high or too low.  please keep a record of the readings and bring it to your next appointment here.  please call us sooner if your blood sugar goes below 70, or if you have a lot of readings over 200.  blood and urine tests are being requested for you today.  We'll contact you with results. If it is high, depending on what it is, we can increase the acarbose, or add "repaglinide."

## 2014-07-19 NOTE — Progress Notes (Signed)
Subjective:    Patient ID: [name], female    DOB: [birth year], 73 y.o.   MRN: [id number]  HPI Pt returns for f/u of type 2 DM (dx'ed 1993; she has mild if any neuropathy of the lower extremities, or other associated complications; she takes 4 oral agents; she has never taken insulin; she has never had pancreatitis, GDM, or severe hypoglycemia, or DKA; she does not take the parlodel--she says it causes hypoglycemia).  she brings a record of her cbg's which i have reviewed today.  It varies from 118-200, but most are in the mid-100's.  There is no trend throughout the day.  Her last steriod injection was 2 weeks ago.   She tolerates acarbose well.  Past Medical History  Diagnosis Date   Arthritis    Depression    Diabetes mellitus without complication    Hypertension     Past Surgical History  Procedure Laterality Date   Abdominal hysterectomy     Gallbladder surgery  2005   Back surgery  2006   Knee surgery  2001/2005   Hernia repair      History   Social History   Marital Status: Unknown    Spouse Name: N/A    Number of Children: N/A   Years of Education: N/A   Occupational History   Not on file.   Social History Main Topics   Smoking status: Former Smoker   Smokeless tobacco: Not on file   Alcohol Use: Not on file   Drug Use: Not on file   Sexual Activity: Not on file   Other Topics Concern   Not on file   Social History Narrative   No narrative on file    Current Outpatient Prescriptions on File Prior to Visit  Medication Sig Dispense Refill   ACCU-CHEK FASTCLIX LANCETS [place] 4 each by Does not apply route daily.  302 each  2   ACCU-CHEK SMARTVIEW test strip        acetaminophen (TYLENOL) 325 MG tablet Take 650 mg by mouth every 6 (six) hours as needed for pain.       ACTOS 45 MG tablet Take 1 tablet by mouth  daily  90 tablet  1   ALPRAZolam (XANAX) 1 MG tablet Take 1 mg by mouth at bedtime as needed for sleep.       atenolol  (TENORMIN) 25 MG tablet Take 25 mg by mouth daily.       Blood Glucose Monitoring Suppl (ACCU-CHEK NANO SMARTVIEW) W/DEVICE KIT 1 each by Does not apply route daily.  1 kit  0   enalapril (VASOTEC) 5 MG tablet Take 5 mg by mouth daily.       glucose blood (ACCU-CHEK SMARTVIEW) test strip Use to test blood glucose 2 times daily, as instructed. Dx code: 250.00  200 each  2   Lancets Misc. (ACCU-CHEK FASTCLIX LANCET) KIT 1 each by Does not apply route daily.  1 kit  0   Lancets Misc. (ACCU-CHEK FASTCLIX LANCET) KIT Use to check blood sugar two times per day dx code 250.00  1 kit  0   metFORMIN (GLUCOPHAGE-XR) 500 MG 24 hr tablet Take 500 mg by mouth 3 (three) times daily.       methocarbamol (ROBAXIN) 500 MG tablet Take 500 mg by mouth 3 (three) times daily.       sertraline (ZOLOFT) 100 MG tablet        sitaGLIPtin (JANUVIA) 100 MG tablet Take 1 tablet (100 mg  total) by mouth daily.  90 tablet  2   traMADol (ULTRAM) 50 MG tablet Take 50 mg by mouth every 6 (six) hours as needed for pain.       No current facility-administered medications on file prior to visit.    Not on File  Family History  Problem Relation Age of Onset   Arthritis Mother    Hyperlipidemia Mother    Heart disease Mother    Hypertension Mother    Diabetes Mother    Arthritis Father    Hyperlipidemia Father    Heart disease Father    Hypertension Father    Diabetes Father    Alcohol abuse Sister    Hypertension Sister    Cancer Sister     BP 118/70   Pulse 69   Temp(Src) 98.7 F (37.1 C) (Oral)   Ht _0  (1.626 m)   Wt 205 lb (92.987 kg)   BMI 35.17 kg/m2   SpO2 97%     Review of Systems She denies hypoglycemia.  She has weight gain    Objective:   Physical Exam Pulses: dorsalis pedis intact bilat.   Feet: no deformity. normal color and temp.  no edema Skin:  no ulcer on the feet.   Neuro: sensation is intact to touch on the feet   Lab Results  Component Value Date   HGBA1C  7.7* 07/19/2014      Assessment & Plan:  DM: mild exacerbation Weight gain: this complicates the rx of DM.  She is advised to re-lose. Back pain: steroid injections complicate the rx of DM.  She is advised to get the injections as needed anyway, to help her pain.     Patient is advised the following: Patient Instructions  Please come back for a follow-up appointment in 3 months. check your blood sugar once a day.  vary the time of day when you check, between before the 3 meals, and at bedtime.  also check if you have symptoms of your blood sugar being too high or too low.  please keep a record of the readings and bring it to your next appointment here.  please call us sooner if your blood sugar goes below 70, or if you have a lot of readings over 200.  blood and urine tests are being requested for you today.  We'll contact you with results. If it is high, depending on what it is, we can increase the acarbose, or add "repaglinide."

## 2014-08-03 ENCOUNTER — Other Ambulatory Visit: Payer: Self-pay | Admitting: Endocrinology

## 2014-08-29 ENCOUNTER — Telehealth: Payer: Self-pay | Admitting: Endocrinology

## 2014-08-29 MED ORDER — ACARBOSE 50 MG PO TABS: 50.0000 mg | ORAL_TABLET | Freq: Two times a day (BID) | ORAL | Status: DC

## 2014-08-29 NOTE — Telephone Encounter (Signed)
please call patient: A1c=7.3--just a little high i have sent a prescription to your pharmacy, to double the acarbose. i'll see you next time.

## 2014-08-30 ENCOUNTER — Telehealth: Payer: Self-pay | Admitting: Endocrinology

## 2014-08-30 ENCOUNTER — Other Ambulatory Visit: Payer: Self-pay

## 2014-08-30 MED ORDER — ACARBOSE 50 MG PO TABS
50.0000 mg | ORAL_TABLET | Freq: Two times a day (BID) | ORAL | Status: DC
Start: 2014-08-30 — End: 2015-09-27

## 2014-08-30 NOTE — Telephone Encounter (Signed)
Lvom advising of lab results and new medication adjustment. Requested call back if pt would like to discuss.

## 2014-08-30 NOTE — Telephone Encounter (Signed)
Pt advised of recent lab work and medication changes.

## 2014-08-30 NOTE — Telephone Encounter (Signed)
Patient is returning your call.  

## 2014-09-06 ENCOUNTER — Telehealth: Payer: Self-pay

## 2014-09-06 NOTE — Telephone Encounter (Signed)
Pt stated that she has not had an eye exam this year, but plans to schedule one soon.

## 2014-10-19 ENCOUNTER — Encounter: Payer: Self-pay | Admitting: Endocrinology

## 2014-10-19 ENCOUNTER — Ambulatory Visit (INDEPENDENT_AMBULATORY_CARE_PROVIDER_SITE_OTHER): Payer: Medicare Other | Admitting: Endocrinology

## 2014-10-19 ENCOUNTER — Other Ambulatory Visit: Payer: Self-pay | Admitting: Endocrinology

## 2014-10-19 VITALS — BP 126/70 | HR 70 | Temp 98.1°F | Ht 64.0 in | Wt 205.0 lb

## 2014-10-19 DIAGNOSIS — E119 Type 2 diabetes mellitus without complications: Secondary | ICD-10-CM

## 2014-10-19 LAB — HEMOGLOBIN A1C
Hgb A1c MFr Bld: 7.1 % — ABNORMAL HIGH (ref <5.7)
MEAN PLASMA GLUCOSE: 157 mg/dL — AB (ref <117)

## 2014-10-19 NOTE — Patient Instructions (Addendum)
blood tests are being requested for you today.  We'll let you know about the results.   If it is high, well add "invokana."  Please reduce the acarbose to once a day (with the biggest meal).  Please come back for a follow-up appointment in 3 months.   check your blood sugar once a day.  vary the time of day when you check, between before the 3 meals, and at bedtime.  also check if you have symptoms of your blood sugar being too high or too low.  please keep a record of the readings and bring it to your next appointment here.  You can write it on any piece of paper.  please call us sooner if your blood sugar goes below 70, or if you have a lot of readings over 200.

## 2014-10-19 NOTE — Progress Notes (Signed)
Subjective:    Patient ID: [name], female    DOB: 1940-12-23, 73 y.o.   MRN: [id number]  HPI  Pt returns for f/u of diabetes mellitus: DM type: 2 Dx'ed: 6270 Complications: none Therapy: 4 oral meds GDM: never DKA: never Severe hypoglycemia: never Pancreatitis: never Other: she stopped bromocriptine, as she said it caused hypoglycemia. Interval history: She says acarbose causes flatulence.  Last steroid injection was 6 weeks ago.  no cbg record, but states cbg's are in the mid-100's.   Past Medical History  Diagnosis Date   Arthritis    Depression    Diabetes mellitus without complication    Hypertension     Past Surgical History  Procedure Laterality Date   Abdominal hysterectomy     Gallbladder surgery  2005   Back surgery  2006   Knee surgery  2001/2005   Hernia repair      History   Social History   Marital Status: Unknown    Spouse Name: N/A    Number of Children: N/A   Years of Education: N/A   Occupational History   Not on file.   Social History Main Topics   Smoking status: Former Smoker   Smokeless tobacco: Not on file   Alcohol Use: Not on file   Drug Use: Not on file   Sexual Activity: Not on file   Other Topics Concern   Not on file   Social History Narrative    Current Outpatient Prescriptions on File Prior to Visit  Medication Sig Dispense Refill   acarbose (PRECOSE) 50 MG tablet Take 1 tablet (50 mg total) by mouth 2 (two) times daily with a meal. (Patient taking differently: Take 50 mg by mouth daily with supper. ) 180 tablet 3   ACCU-CHEK FASTCLIX LANCETS [place] 4 each by Does not apply route daily. 302 each 2   ACCU-CHEK SMARTVIEW test strip      acetaminophen (TYLENOL) 325 MG tablet Take 650 mg by mouth every 6 (six) hours as needed for pain.     ACTOS 45 MG tablet Take 1 tablet by mouth  daily 90 tablet 1   ALPRAZolam (XANAX) 1 MG tablet Take 1 mg by mouth at bedtime as needed for sleep.     atenolol  (TENORMIN) 25 MG tablet Take 25 mg by mouth daily.     Blood Glucose Monitoring Suppl (ACCU-CHEK NANO SMARTVIEW) W/DEVICE KIT 1 each by Does not apply route daily. 1 kit 0   enalapril (VASOTEC) 5 MG tablet Take 5 mg by mouth daily.     glucose blood (ACCU-CHEK SMARTVIEW) test strip Use to test blood glucose 2 times daily, as instructed. Dx code: 250.00 200 each 2   Lancets Misc. (ACCU-CHEK FASTCLIX LANCET) KIT 1 each by Does not apply route daily. 1 kit 0   Lancets Misc. (ACCU-CHEK FASTCLIX LANCET) KIT Use to check blood sugar two times per day dx code 250.00 1 kit 0   metFORMIN (GLUCOPHAGE-XR) 500 MG 24 hr tablet Take 2 tablets by mouth  twice a day 360 tablet 0   methocarbamol (ROBAXIN) 500 MG tablet Take 500 mg by mouth 3 (three) times daily.     sertraline (ZOLOFT) 100 MG tablet      sitaGLIPtin (JANUVIA) 100 MG tablet Take 1 tablet (100 mg total) by mouth daily. 90 tablet 2   traMADol (ULTRAM) 50 MG tablet Take 50 mg by mouth every 6 (six) hours as needed for pain.     No current  facility-administered medications on file prior to visit.    Not on File  Family History  Problem Relation Age of Onset   Arthritis Mother    Hyperlipidemia Mother    Heart disease Mother    Hypertension Mother    Diabetes Mother    Arthritis Father    Hyperlipidemia Father    Heart disease Father    Hypertension Father    Diabetes Father    Alcohol abuse Sister    Hypertension Sister    Cancer Sister     BP 126/70 mmHg   Pulse 70   Temp(Src) 98.1 F (36.7 C) (Oral)   Ht _0  (1.626 m)   Wt 205 lb (92.987 kg)   BMI 35.17 kg/m2   SpO2 93%  Review of Systems She denies hypoglycemia and weight change.     Objective:   Physical Exam VITAL SIGNS:  See vs page GENERAL: no distress Pulses: dorsalis pedis intact bilat.   Feet: no deformity.  no edema Skin:  no ulcer on the feet.  normal color and temp. Neuro: sensation is intact to touch on the feet  Lab Results   Component Value Date   HGBA1C 7.1* 10/19/2014   Lab Results  Component Value Date   CREATININE 0.64 10/19/2014   BUN 9 10/19/2014   NA 135 10/19/2014   K 4.4 10/19/2014   CL 97 10/19/2014   CO2 26 10/19/2014       Assessment & Plan:  DM: mild exacerbation. Side-effect of rx: flatulence, new.   Patient is advised the following: Patient Instructions  blood tests are being requested for you today.  We'll let you know about the results.   If it is high, well add "invokana."  Please reduce the acarbose to once a day (with the biggest meal).  Please come back for a follow-up appointment in 3 months.   check your blood sugar once a day.  vary the time of day when you check, between before the 3 meals, and at bedtime.  also check if you have symptoms of your blood sugar being too high or too low.  please keep a record of the readings and bring it to your next appointment here.  You can write it on any piece of paper.  please call us sooner if your blood sugar goes below 70, or if you have a lot of readings over 200.

## 2014-10-20 ENCOUNTER — Telehealth: Payer: Self-pay

## 2014-10-20 ENCOUNTER — Telehealth: Payer: Self-pay | Admitting: Endocrinology

## 2014-10-20 LAB — BASIC METABOLIC PANEL
BUN: 9 mg/dL (ref 6–23)
CALCIUM: 9 mg/dL (ref 8.4–10.5)
CHLORIDE: 97 meq/L (ref 96–112)
CO2: 26 mEq/L (ref 19–32)
Creat: 0.64 mg/dL (ref 0.50–1.10)
Glucose, Bld: 187 mg/dL — ABNORMAL HIGH (ref 70–99)
Potassium: 4.4 mEq/L (ref 3.5–5.3)
SODIUM: 135 meq/L (ref 135–145)

## 2014-10-20 MED ORDER — CANAGLIFLOZIN 100 MG PO TABS: 100.0000 mg | ORAL_TABLET | Freq: Every day | ORAL | Status: DC

## 2014-10-20 NOTE — Telephone Encounter (Signed)
Called pt. She states that right now she would like to continue 3 per day. Pt stated while she was on the 4 per day she had severe diarrhea.

## 2014-10-20 NOTE — Telephone Encounter (Signed)
3 is ok, but 4 is better, if it is ok with you.

## 2014-10-20 NOTE — Telephone Encounter (Signed)
Pt wanted to clarify metformin dosage pt states that she has been taking 3 tablets daily. Med list states 4 tablets daily. Is the medication list correct? Thanks!

## 2014-10-20 NOTE — Telephone Encounter (Signed)
Patient is returning your call, call her on her home phone.

## 2014-10-20 NOTE — Telephone Encounter (Signed)
Pt advised of result note and voiced understanding.

## 2014-12-08 ENCOUNTER — Other Ambulatory Visit: Payer: Self-pay | Admitting: Endocrinology

## 2015-01-02 ENCOUNTER — Other Ambulatory Visit: Payer: Self-pay | Admitting: Endocrinology

## 2015-01-19 ENCOUNTER — Ambulatory Visit: Payer: Medicare Other | Admitting: Endocrinology

## 2015-02-23 ENCOUNTER — Ambulatory Visit: Payer: Self-pay | Admitting: Endocrinology

## 2015-03-08 ENCOUNTER — Ambulatory Visit: Payer: Self-pay | Admitting: Endocrinology

## 2015-04-17 ENCOUNTER — Telehealth: Payer: Self-pay | Admitting: Endocrinology

## 2015-04-17 NOTE — Telephone Encounter (Signed)
Pt called and wanted to let it be know that she hasn't been to her apt because she fell in April and broke her heel bone and is not allow to put weight on her foot also wanted to let you knows and she is still taking her medication

## 2015-05-23 ENCOUNTER — Other Ambulatory Visit: Payer: Self-pay | Admitting: Endocrinology

## 2015-07-23 ENCOUNTER — Telehealth: Payer: Self-pay | Admitting: Endocrinology

## 2015-07-23 NOTE — Telephone Encounter (Signed)
Patient would like for you to call her concerning making an appointment, please advise

## 2015-07-24 NOTE — Telephone Encounter (Signed)
Requested a call back from the pt to discuss.

## 2015-08-03 ENCOUNTER — Other Ambulatory Visit: Payer: Self-pay | Admitting: Endocrinology

## 2015-08-09 ENCOUNTER — Other Ambulatory Visit: Payer: Self-pay | Admitting: Endocrinology

## 2015-08-10 ENCOUNTER — Telehealth: Payer: Self-pay | Admitting: Endocrinology

## 2015-08-13 NOTE — Telephone Encounter (Signed)
Error

## 2015-08-28 ENCOUNTER — Telehealth: Payer: Self-pay | Admitting: Endocrinology

## 2015-08-28 NOTE — Telephone Encounter (Signed)
I contacted the pt. Patient scheduled for 09/12/15 at 11:15 pm.

## 2015-08-28 NOTE — Telephone Encounter (Signed)
please call patient: Ov is due

## 2015-08-31 ENCOUNTER — Other Ambulatory Visit: Payer: Self-pay | Admitting: Endocrinology

## 2015-09-12 ENCOUNTER — Ambulatory Visit: Payer: Self-pay | Admitting: Endocrinology

## 2015-09-27 ENCOUNTER — Ambulatory Visit (INDEPENDENT_AMBULATORY_CARE_PROVIDER_SITE_OTHER): Payer: Medicare Other | Admitting: Endocrinology

## 2015-09-27 ENCOUNTER — Encounter: Payer: Self-pay | Admitting: Endocrinology

## 2015-09-27 VITALS — BP 128/86 | HR 86 | Temp 98.3°F | Wt 190.0 lb

## 2015-09-27 DIAGNOSIS — E119 Type 2 diabetes mellitus without complications: Secondary | ICD-10-CM

## 2015-09-27 LAB — POCT GLYCOSYLATED HEMOGLOBIN (HGB A1C): Hemoglobin A1C: 8.1

## 2015-09-27 MED ORDER — REPAGLINIDE 0.5 MG PO TABS: 0.5000 mg | ORAL_TABLET | Freq: Three times a day (TID) | ORAL | Status: DC

## 2015-09-27 NOTE — Progress Notes (Signed)
Subjective:    Patient ID: [name], female    DOB: August 02, 1941, 74 y.o.   MRN: [id number]  HPI Pt returns for f/u of diabetes mellitus: DM type: 2 Dx'ed: 0086 Complications: none Therapy: 4 oral meds GDM: never DKA: never Severe hypoglycemia: never Pancreatitis: never Other: she stopped bromocriptine, as she said it caused hypoglycemia; she did not tolerate invokana, due to dizziness.    Interval history: She says acarbose causes flatulence.  Last steroid injection was 6 weeks ago.  no cbg record, but states cbg's are in the mid-100's.  Past Medical History  Diagnosis Date   Arthritis    Depression    Diabetes mellitus without complication ([place])    Hypertension     Past Surgical History  Procedure Laterality Date   Abdominal hysterectomy     Gallbladder surgery  2005   Back surgery  2006   Knee surgery  2001/2005   Hernia repair      Social History   Social History   Marital Status: Unknown    Spouse Name: N/A   Number of Children: N/A   Years of Education: N/A   Occupational History   Not on file.   Social History Main Topics   Smoking status: Former Smoker   Smokeless tobacco: Not on file   Alcohol Use: Not on file   Drug Use: Not on file   Sexual Activity: Not on file   Other Topics Concern   Not on file   Social History Narrative    Current Outpatient Prescriptions on File Prior to Visit  Medication Sig Dispense Refill   ACCU-CHEK FASTCLIX LANCETS MISC Check blood sugar two times daily 204 each 2   ACCU-CHEK SMARTVIEW test strip Use to test blood glucose  two times daily as directed 200 each 2   acetaminophen (TYLENOL) 325 MG tablet Take 650 mg by mouth every 6 (six) hours as needed for pain.     ALPRAZolam (XANAX) 1 MG tablet Take 1 mg by mouth at bedtime as needed for sleep.     atenolol (TENORMIN) 25 MG tablet Take 25 mg by mouth daily.     Blood Glucose Monitoring Suppl (ACCU-CHEK NANO SMARTVIEW) W/DEVICE KIT  Check blood sugar 1 time  per day 1 kit 2   enalapril (VASOTEC) 5 MG tablet Take 5 mg by mouth daily.     glucose blood (ACCU-CHEK SMARTVIEW) test strip Use to test blood glucose 2 times daily, as instructed. Dx code: 250.00 200 each 2   JANUVIA 100 MG tablet Take 1 tablet by mouth  daily 90 tablet 2   Lancets Misc. (ACCU-CHEK FASTCLIX LANCET) KIT 1 each by Does not apply route daily. 1 kit 0   Lancets Misc. (ACCU-CHEK FASTCLIX LANCET) KIT Use to check blood sugar  two times per day 1 kit 2   metFORMIN (GLUCOPHAGE-XR) 500 MG 24 hr tablet Take 2 tablets by mouth  twice a day 360 tablet 1   methocarbamol (ROBAXIN) 500 MG tablet Take 500 mg by mouth 3 (three) times daily.     pioglitazone (ACTOS) 45 MG tablet Take 1 tablet by mouth  daily 90 tablet 1   sertraline (ZOLOFT) 100 MG tablet      traMADol (ULTRAM) 50 MG tablet Take 50 mg by mouth every 6 (six) hours as needed for pain.     No current facility-administered medications on file prior to visit.    Not on File  Family History  Problem Relation Age of Onset  Arthritis Mother    Hyperlipidemia Mother    Heart disease Mother    Hypertension Mother    Diabetes Mother    Arthritis Father    Hyperlipidemia Father    Heart disease Father    Hypertension Father    Diabetes Father    Alcohol abuse Sister    Hypertension Sister    Cancer Sister     BP 128/86 mmHg   Pulse 86   Temp(Src) 98.3 F (36.8 C) (Oral)   Wt 190 lb (86.183 kg)   SpO2 93%  Review of Systems He denies hypoglycemia.    Objective:   Physical Exam VITAL SIGNS:  See vs page GENERAL: no distress Pulses: dorsalis pedis intact bilat.   MSK: no deformity of the feet CV: no leg edema Skin:  no ulcer on the feet.  normal color and temp on the feet. Neuro: sensation is intact to touch on the feet    A1c=8.1%    Assessment & Plan:  DM: he needs increased rx.   Patient is advised the following: Patient Instructions  i have sent a  prescription to your pharmacy, to add "repaglinide." Please come back for a follow-up appointment in 3 months.  check your blood sugar once a day.  vary the time of day when you check, between before the 3 meals, and at bedtime.  also check if you have symptoms of your blood sugar being too high or too low.  please keep a record of the readings and bring it to your next appointment here (or you can bring the meter itself).  You can write it on any piece of paper.  please call us sooner if your blood sugar goes below 90, or if you have a lot of readings over 200.

## 2015-09-27 NOTE — Patient Instructions (Addendum)
i have sent a prescription to your pharmacy, to add "repaglinide." Please come back for a follow-up appointment in 3 months.  check your blood sugar once a day.  vary the time of day when you check, between before the 3 meals, and at bedtime.  also check if you have symptoms of your blood sugar being too high or too low.  please keep a record of the readings and bring it to your next appointment here (or you can bring the meter itself).  You can write it on any piece of paper.  please call us sooner if your blood sugar goes below 90, or if you have a lot of readings over 200.

## 2015-09-28 ENCOUNTER — Other Ambulatory Visit: Payer: Self-pay | Admitting: Endocrinology

## 2015-10-04 ENCOUNTER — Telehealth: Payer: Self-pay | Admitting: Endocrinology

## 2015-10-04 NOTE — Telephone Encounter (Signed)
Ok, try 1/2 pill at first.  Call if any cbg's below 70

## 2015-10-04 NOTE — Telephone Encounter (Signed)
Patient called stating that she has a question for Dr. Dwyane Dee   Her blood sugar readings have been 120-130 She is concerned about taking the Prandin; it will cause her blood sugar to drop way low   Should she be taking this?  Please advise    Thank you

## 2015-10-04 NOTE — Telephone Encounter (Signed)
Forwarded

## 2015-10-04 NOTE — Telephone Encounter (Signed)
Please see below

## 2015-10-05 NOTE — Telephone Encounter (Signed)
Patient advised of note below and voiced understanding.

## 2015-11-13 ENCOUNTER — Other Ambulatory Visit: Payer: Self-pay | Admitting: Endocrinology

## 2015-12-12 ENCOUNTER — Telehealth: Payer: Self-pay | Admitting: Endocrinology

## 2015-12-28 ENCOUNTER — Encounter: Payer: Self-pay | Admitting: Endocrinology

## 2015-12-28 ENCOUNTER — Ambulatory Visit (INDEPENDENT_AMBULATORY_CARE_PROVIDER_SITE_OTHER): Payer: Medicare Other | Admitting: Endocrinology

## 2015-12-28 VITALS — BP 128/62 | HR 68 | Temp 98.1°F | Ht 64.0 in | Wt 185.0 lb

## 2015-12-28 DIAGNOSIS — E119 Type 2 diabetes mellitus without complications: Secondary | ICD-10-CM

## 2015-12-28 LAB — POCT GLYCOSYLATED HEMOGLOBIN (HGB A1C): Hemoglobin A1C: 6.6

## 2015-12-28 NOTE — Telephone Encounter (Signed)
error 

## 2015-12-28 NOTE — Progress Notes (Signed)
Subjective:    Patient ID: [name], female    DOB: [birth year], 75 y.o.   MRN: [id number]  HPI Pt returns for f/u of diabetes mellitus: DM type: 2 Dx'ed: 0347 Complications: none Therapy: 4 oral meds GDM: never DKA: never Severe hypoglycemia: never Pancreatitis: never Other: she stopped bromocriptine, as she said it caused hypoglycemia; she did not tolerate invokana, due to dizziness.    Interval history:   no cbg record, but states cbg's are in the mid-100's.  She does not take parndin Past Medical History  Diagnosis Date   Arthritis    Depression    Diabetes mellitus without complication ([place])    Hypertension     Past Surgical History  Procedure Laterality Date   Abdominal hysterectomy     Gallbladder surgery  2005   Back surgery  2006   Knee surgery  2001/2005   Hernia repair      Social History   Social History   Marital Status: Unknown    Spouse Name: N/A   Number of Children: N/A   Years of Education: N/A   Occupational History   Not on file.   Social History Main Topics   Smoking status: Former Smoker   Smokeless tobacco: Not on file   Alcohol Use: Not on file   Drug Use: Not on file   Sexual Activity: Not on file   Other Topics Concern   Not on file   Social History Narrative    Current Outpatient Prescriptions on File Prior to Visit  Medication Sig Dispense Refill   acarbose (PRECOSE) 25 MG tablet Take 1 tablet by mouth  twice a day with meals 180 tablet 2   ACCU-CHEK FASTCLIX LANCETS MISC Check blood sugar two times daily 204 each 2   ACCU-CHEK SMARTVIEW test strip Use to test blood glucose  two times daily as directed 200 each 2   acetaminophen (TYLENOL) 325 MG tablet Take 650 mg by mouth every 6 (six) hours as needed for pain.     ALPRAZolam (XANAX) 1 MG tablet Take 1 mg by mouth at bedtime as needed for sleep.     atenolol (TENORMIN) 25 MG tablet Take 25 mg by mouth daily.     Blood Glucose Monitoring Suppl  (ACCU-CHEK NANO SMARTVIEW) W/DEVICE KIT Check blood sugar 1 time  per day 1 kit 2   enalapril (VASOTEC) 5 MG tablet Take 5 mg by mouth daily.     glucose blood (ACCU-CHEK SMARTVIEW) test strip Use to test blood glucose 2 times daily, as instructed. Dx code: 250.00 200 each 2   JANUVIA 100 MG tablet Take 1 tablet by mouth  daily 90 tablet 2   Lancets Misc. (ACCU-CHEK FASTCLIX LANCET) KIT 1 each by Does not apply route daily. 1 kit 0   metFORMIN (GLUCOPHAGE-XR) 500 MG 24 hr tablet Take 2 tablets by mouth  twice a day (Patient taking differently: Take 3 tabs daily.) 360 tablet 1   methocarbamol (ROBAXIN) 500 MG tablet Take 500 mg by mouth 3 (three) times daily.     pioglitazone (ACTOS) 45 MG tablet Take 1 tablet by mouth  daily 90 tablet 1   repaglinide (PRANDIN) 0.5 MG tablet Take 1 tablet (0.5 mg total) by mouth 3 (three) times daily before meals. 90 tablet 11   sertraline (ZOLOFT) 100 MG tablet      traMADol (ULTRAM) 50 MG tablet Take 50 mg by mouth every 6 (six) hours as needed for pain.  Vitamin D, Ergocalciferol, (DRISDOL) 50000 UNITS CAPS capsule Take 50,000 Units by mouth every 7 (seven) days.     No current facility-administered medications on file prior to visit.    Not on File  Family History  Problem Relation Age of Onset   Arthritis Mother    Hyperlipidemia Mother    Heart disease Mother    Hypertension Mother    Diabetes Mother    Arthritis Father    Hyperlipidemia Father    Heart disease Father    Hypertension Father    Diabetes Father    Alcohol abuse Sister    Hypertension Sister    Cancer Sister     BP 128/62 mmHg   Pulse 68   Temp(Src) 98.1 F (36.7 C) (Oral)   Ht 5' 4"  (1.626 m)   Wt 185 lb (83.915 kg)   BMI 31.74 kg/m2   SpO2 96%    Review of Systems She denies hypoglycemia.      Objective:   Physical Exam VITAL SIGNS:  See vs page GENERAL: no distress Pulses: right dorsalis pedis is intact.   MSK: no deformity of the right  foot CV: no leg edema Skin:  no ulcer on the right foot.  normal color and temp on the right foot Neuro: sensation is intact to touch on the right foot Ext: Left foot is in a wooden shoe   A1c=6.6%    Assessment & Plan:  DM: well-controlled, but she will need insulin with time.    Patient is advised the following: Patient Instructions  Please come back for a follow-up appointment in 3 months.  Please see Ula the same day, to learn how to take insulin, just in case.   check your blood sugar once a day.  vary the time of day when you check, between before the 3 meals, and at bedtime.  also check if you have symptoms of your blood sugar being too high or too low.  please keep a record of the readings and bring it to your next appointment here (or you can bring the meter itself).  You can write it on any piece of paper.  please call us sooner if your blood sugar goes below 90, or if you have a lot of readings over 200.

## 2015-12-28 NOTE — Patient Instructions (Addendum)
Please come back for a follow-up appointment in 3 months.  Please see [facility] the same day, to learn how to take insulin, just in case.   check your blood sugar once a day.  vary the time of day when you check, between before the 3 meals, and at bedtime.  also check if you have symptoms of your blood sugar being too high or too low.  please keep a record of the readings and bring it to your next appointment here (or you can bring the meter itself).  You can write it on any piece of paper.  please call us sooner if your blood sugar goes below 90, or if you have a lot of readings over 200.

## 2016-01-01 ENCOUNTER — Other Ambulatory Visit: Payer: Self-pay | Admitting: Endocrinology

## 2016-01-31 ENCOUNTER — Emergency Department (HOSPITAL_COMMUNITY): Payer: Medicare Other

## 2016-01-31 ENCOUNTER — Emergency Department (HOSPITAL_COMMUNITY)
Admission: EM | Admit: 2016-01-31 | Discharge: 2016-01-31 | Disposition: A | Discharge status: Home / Self Care | Payer: Medicare Other | Attending: Emergency Medicine | Admitting: Emergency Medicine

## 2016-01-31 ENCOUNTER — Encounter (HOSPITAL_COMMUNITY): Payer: Self-pay | Admitting: *Deleted

## 2016-01-31 DIAGNOSIS — Y92524 Gas station as the place of occurrence of the external cause: Secondary | ICD-10-CM | POA: Insufficient documentation

## 2016-01-31 DIAGNOSIS — S59902A Unspecified injury of left elbow, initial encounter: Secondary | ICD-10-CM | POA: Insufficient documentation

## 2016-01-31 DIAGNOSIS — S99922A Unspecified injury of left foot, initial encounter: Secondary | ICD-10-CM | POA: Diagnosis not present

## 2016-01-31 DIAGNOSIS — Z79899 Other long term (current) drug therapy: Secondary | ICD-10-CM | POA: Insufficient documentation

## 2016-01-31 DIAGNOSIS — F329 Major depressive disorder, single episode, unspecified: Secondary | ICD-10-CM | POA: Insufficient documentation

## 2016-01-31 DIAGNOSIS — Y998 Other external cause status: Secondary | ICD-10-CM | POA: Diagnosis not present

## 2016-01-31 DIAGNOSIS — W01198A Fall on same level from slipping, tripping and stumbling with subsequent striking against other object, initial encounter: Secondary | ICD-10-CM | POA: Insufficient documentation

## 2016-01-31 DIAGNOSIS — S0990XA Unspecified injury of head, initial encounter: Secondary | ICD-10-CM | POA: Diagnosis present

## 2016-01-31 DIAGNOSIS — Z87891 Personal history of nicotine dependence: Secondary | ICD-10-CM | POA: Diagnosis not present

## 2016-01-31 DIAGNOSIS — G8929 Other chronic pain: Secondary | ICD-10-CM | POA: Diagnosis not present

## 2016-01-31 DIAGNOSIS — E119 Type 2 diabetes mellitus without complications: Secondary | ICD-10-CM | POA: Insufficient documentation

## 2016-01-31 DIAGNOSIS — W19XXXA Unspecified fall, initial encounter: Secondary | ICD-10-CM

## 2016-01-31 DIAGNOSIS — I10 Essential (primary) hypertension: Secondary | ICD-10-CM | POA: Diagnosis not present

## 2016-01-31 DIAGNOSIS — Y9389 Activity, other specified: Secondary | ICD-10-CM | POA: Insufficient documentation

## 2016-01-31 DIAGNOSIS — Z7984 Long term (current) use of oral hypoglycemic drugs: Secondary | ICD-10-CM | POA: Insufficient documentation

## 2016-01-31 DIAGNOSIS — R519 Headache, unspecified: Secondary | ICD-10-CM

## 2016-01-31 DIAGNOSIS — S199XXA Unspecified injury of neck, initial encounter: Secondary | ICD-10-CM | POA: Diagnosis not present

## 2016-01-31 DIAGNOSIS — M1990 Unspecified osteoarthritis, unspecified site: Secondary | ICD-10-CM | POA: Diagnosis not present

## 2016-01-31 DIAGNOSIS — R51 Headache: Secondary | ICD-10-CM

## 2016-01-31 MED ORDER — ACETAMINOPHEN 325 MG PO TABS
650.0000 mg | ORAL_TABLET | Freq: Once | ORAL | Status: AC
  Administered 2016-01-31: 650 mg via ORAL
  Filled 2016-01-31: qty 2

## 2016-01-31 NOTE — ED Notes (Signed)
Per EMS, pt complains of head and neck pain since fall today. Pt fell while standing at gas station. Pt states she hit her head on concrete. No loss of consciousness. Pt has hx of neck pain, states neck hurts worse after fall. Pt is not taking blood thinners. Pt has bruise to back of head.

## 2016-01-31 NOTE — ED Provider Notes (Signed)
CSN: [id number]     Arrival date & time 01/31/16  1554 History   First MD Initiated Contact with Patient 01/31/16 1619     Chief Complaint  Patient presents with   Fall   Head Injury   Neck Pain     (Consider location/radiation/quality/duration/timing/severity/associated sxs/prior Treatment) HPI Comments: Pt standing in line at gas station Man had pain and turned around quickly trying to leave and ran into her She fell down, hitting the back of her head Has headache, severe LOC, not sure how long No known seizure-like activity Occurred just prior to arrival Neck pain, back of neck, has chronic pain however worsened by fall Chronic back pain worsened by fall Pain left elbow No other medical concerns recently No new numbness/weakness   Past Medical History  Diagnosis Date   Arthritis    Depression    Diabetes mellitus without complication ([place])    Hypertension    Past Surgical History  Procedure Laterality Date   Abdominal hysterectomy     Gallbladder surgery  2005   Back surgery  2006   Knee surgery  2001/2005   Hernia repair     Family History  Problem Relation Age of Onset   Arthritis Mother    Hyperlipidemia Mother    Heart disease Mother    Hypertension Mother    Diabetes Mother    Arthritis Father    Hyperlipidemia Father    Heart disease Father    Hypertension Father    Diabetes Father    Alcohol abuse Sister    Hypertension Sister    Cancer Sister    Social History  Substance Use Topics   Smoking status: Former Smoker   Smokeless tobacco: None   Alcohol Use: None   OB History    No data available     Review of Systems  Constitutional: Negative for fever.  HENT: Negative for sore throat.   Eyes: Negative for visual disturbance.  Respiratory: Negative for cough and shortness of breath.   Cardiovascular: Negative for chest pain.  Gastrointestinal: Negative for nausea, vomiting and abdominal pain.  Genitourinary:  Negative for difficulty urinating.  Musculoskeletal: Positive for back pain, arthralgias and neck pain.  Skin: Positive for color change (bruises). Negative for rash.  Neurological: Positive for headaches. Negative for syncope, weakness and numbness.      Allergies  Aspirin; Codeine; and Morphine and related  Home Medications   Prior to Admission medications   Medication Sig Start Date End Date Taking? Authorizing Provider  ACCU-CHEK FASTCLIX LANCETS MISC Check blood sugar two times daily 08/07/15  Yes Renato Shin, MD  ACCU-CHEK SMARTVIEW test strip Use to test blood glucose  two times daily as directed 08/07/15  Yes Renato Shin, MD  acetaminophen (TYLENOL) 325 MG tablet Take 650 mg by mouth every 6 (six) hours as needed for pain.   Yes Historical Provider, MD  ALPRAZolam Duanne Moron) 1 MG tablet Take 1 mg by mouth at bedtime as needed for sleep.   Yes Historical Provider, MD  atenolol (TENORMIN) 25 MG tablet Take 25 mg by mouth daily.   Yes Historical Provider, MD  Blood Glucose Monitoring Suppl (ACCU-CHEK NANO SMARTVIEW) W/DEVICE KIT Check blood sugar 1 time  per day 09/03/15  Yes Renato Shin, MD  CRESTOR 5 MG tablet Take 5 mg by mouth daily. 11/13/15  Yes Historical Provider, MD  enalapril (VASOTEC) 5 MG tablet Take 5 mg by mouth daily.   Yes Historical Provider, MD  glucose blood (ACCU-CHEK SMARTVIEW)  test strip Use to test blood glucose 2 times daily, as instructed. Dx code: 250.00 05/16/14  Yes Renato Shin, MD  JANUVIA 100 MG tablet Take 1 tablet by mouth  daily Patient taking differently: Take 100 mg by mouth daily 08/07/15  Yes Renato Shin, MD  Lancets Misc. (ACCU-CHEK FASTCLIX LANCET) KIT 1 each by Does not apply route daily. 06/02/13  Yes Renato Shin, MD  metFORMIN (GLUCOPHAGE-XR) 500 MG 24 hr tablet Take 2 tablets by mouth  twice a day Patient taking differently: Take 500 mg by mouth every morning and 1000 mg every evening 05/23/15  Yes Renato Shin, MD  methocarbamol (ROBAXIN) 500 MG  tablet Take 500 mg by mouth 3 (three) times daily.   Yes Historical Provider, MD  pioglitazone (ACTOS) 45 MG tablet Take 1 tablet by mouth  daily Patient taking differently: Take 45 mg by mouth daily 01/01/16  Yes Renato Shin, MD  sertraline (ZOLOFT) 100 MG tablet Take 100 mg by mouth daily.  04/18/13  Yes Historical Provider, MD  traMADol (ULTRAM) 50 MG tablet Take 50 mg by mouth every 6 (six) hours as needed for pain.   Yes Historical Provider, MD  Vitamin D, Ergocalciferol, (DRISDOL) 50000 UNITS CAPS capsule Take 50,000 Units by mouth every 7 (seven) days.   Yes Historical Provider, MD  acarbose (PRECOSE) 25 MG tablet Take 1 tablet by mouth  twice a day with meals Patient not taking: Reported on 01/31/2016 09/28/15   Renato Shin, MD  repaglinide (PRANDIN) 0.5 MG tablet Take 1 tablet (0.5 mg total) by mouth 3 (three) times daily before meals. Patient not taking: Reported on 01/31/2016 09/27/15   Renato Shin, MD   BP 136/76 mmHg   Pulse 69   Temp(Src) 98.9 F (37.2 C) (Oral)   Resp 18   SpO2 100% Physical Exam  Constitutional: She is oriented to person, place, and time. She appears well-developed and well-nourished. No distress.  HENT:  Head: Normocephalic and atraumatic.  Eyes: Conjunctivae and EOM are normal.  Neck: Normal range of motion.  Cardiovascular: Normal rate, regular rhythm, normal heart sounds and intact distal pulses.  Exam reveals no gallop and no friction rub.   No murmur heard. Pulmonary/Chest: Effort normal and breath sounds normal. No respiratory distress. She has no wheezes. She has no rales.  Abdominal: Soft. She exhibits no distension. There is no tenderness. There is no guarding.  Musculoskeletal: She exhibits no edema.       Left elbow: She exhibits no deformity and no laceration. Tenderness found.       Cervical back: She exhibits tenderness.       Left foot: There is tenderness (has stress fx, seeing orthopedics for this).  Neurological: She is alert and oriented to  person, place, and time. She has normal strength. No sensory deficit. GCS eye subscore is 4. GCS verbal subscore is 5. GCS motor subscore is 6.  Skin: Skin is warm and dry. No rash noted. She is not diaphoretic. No erythema.  Nursing note and vitals reviewed.   ED Course  Procedures (including critical care time) Labs Review Labs Reviewed - No data to display  Imaging Review Dg Thoracic Spine 2 View  01/31/2016  CLINICAL DATA:  Fall.  Neck pain and back pain. EXAM: THORACIC SPINE 2 VIEWS COMPARISON:  None. FINDINGS: Bony demineralization. No thoracic spine fracture or acute subluxation identified. IMPRESSION: 1. No acute thoracic spine findings. 2. Bony demineralization. Electronically Signed   By: Van Clines M.D.   On:  01/31/2016 17:17   Dg Lumbar Spine Complete  01/31/2016  CLINICAL DATA:  Patient fell today.  Low back pain. EXAM: LUMBAR SPINE - COMPLETE 4+ VIEW COMPARISON:  None. FINDINGS: No fracture.  No spondylolisthesis. Mild loss of disc height at L2-L3 with mild to moderate loss of disc height at L4-L5. Remaining discs and the facet joints are well preserved. There are calcifications along the abdominal aorta. Hernia mesh overlies the left inguinal region. Soft tissues otherwise unremarkable. IMPRESSION: No fracture or acute finding. Electronically Signed   By: Lajean Manes M.D.   On: 01/31/2016 17:18   Dg Elbow Complete Left  01/31/2016  CLINICAL DATA:  Fall today with elbow pain, initial encounter EXAM: LEFT ELBOW - COMPLETE 3+ VIEW COMPARISON:  None. FINDINGS: There is no evidence of fracture, dislocation, or joint effusion. There is no evidence of arthropathy or other focal bone abnormality. Soft tissues are unremarkable. IMPRESSION: No acute abnormality noted. Electronically Signed   By: Inez Catalina M.D.   On: 01/31/2016 17:16   Ct Head Wo Contrast  01/31/2016  CLINICAL DATA:  Head and neck pain after fall today EXAM: CT HEAD WITHOUT CONTRAST CT CERVICAL SPINE WITHOUT  CONTRAST TECHNIQUE: Multidetector CT imaging of the head and cervical spine was performed following the standard protocol without intravenous contrast. Multiplanar CT image reconstructions of the cervical spine were also generated. COMPARISON:  None. FINDINGS: CT HEAD FINDINGS No skull fracture is noted. There is mild scalp swelling and subcutaneous stranding in left posterior parietal region see axial image 22. No scalp hematoma is noted. No intracranial hemorrhage, mass effect or midline shift. No acute cortical infarction. No mass lesion is noted on this unenhanced scan. Mild cerebral atrophy. Mild periventricular white matter decreased attenuation probable due to chronic small vessel ischemic changes. CT CERVICAL SPINE FINDINGS Axial images of the cervical spine shows no acute fracture or subluxation. Mild degenerative changes are noted C1-C2 articulation. Computer processed images shows no acute fracture or subluxation. There is disc space flattening with mild anterior spurring at C5-C6 level. Minimal disc space flattening at C7-T1 level. No prevertebral soft tissue swelling. Cervical airway is patent. There is no pneumothorax in visualized lung apices. IMPRESSION: 1. No acute intracranial abnormality. Mild scalp swelling and subcutaneous stranding in left posterior parietal region please see axial image 22. Mild cerebral atrophy. Mild periventricular white matter decreased attenuation probable due to chronic small vessel ischemic changes. 2. No cervical spine acute fracture or subluxation. Mild degenerative changes as described above. There is no pneumothorax in visualized lung apices. No prevertebral soft tissue swelling. Electronically Signed   By: Lahoma Crocker M.D.   On: 01/31/2016 16:57   Ct Cervical Spine Wo Contrast  01/31/2016  CLINICAL DATA:  Head and neck pain after fall today EXAM: CT HEAD WITHOUT CONTRAST CT CERVICAL SPINE WITHOUT CONTRAST TECHNIQUE: Multidetector CT imaging of the head and cervical  spine was performed following the standard protocol without intravenous contrast. Multiplanar CT image reconstructions of the cervical spine were also generated. COMPARISON:  None. FINDINGS: CT HEAD FINDINGS No skull fracture is noted. There is mild scalp swelling and subcutaneous stranding in left posterior parietal region see axial image 22. No scalp hematoma is noted. No intracranial hemorrhage, mass effect or midline shift. No acute cortical infarction. No mass lesion is noted on this unenhanced scan. Mild cerebral atrophy. Mild periventricular white matter decreased attenuation probable due to chronic small vessel ischemic changes. CT CERVICAL SPINE FINDINGS Axial images of the cervical spine shows no  acute fracture or subluxation. Mild degenerative changes are noted C1-C2 articulation. Computer processed images shows no acute fracture or subluxation. There is disc space flattening with mild anterior spurring at C5-C6 level. Minimal disc space flattening at C7-T1 level. No prevertebral soft tissue swelling. Cervical airway is patent. There is no pneumothorax in visualized lung apices. IMPRESSION: 1. No acute intracranial abnormality. Mild scalp swelling and subcutaneous stranding in left posterior parietal region please see axial image 22. Mild cerebral atrophy. Mild periventricular white matter decreased attenuation probable due to chronic small vessel ischemic changes. 2. No cervical spine acute fracture or subluxation. Mild degenerative changes as described above. There is no pneumothorax in visualized lung apices. No prevertebral soft tissue swelling. Electronically Signed   By: Lahoma Crocker M.D.   On: 01/31/2016 16:57   I have personally reviewed and evaluated these images and lab results as part of my medical decision-making.   EKG Interpretation None      MDM   Final diagnoses:  Fall, initial encounter  Acute nonintractable headache, unspecified headache type   75yo female with history of  arthritis, depression, DM, hypertension presents with concern for fall from standing with headache, neck pain, back pain.  CT head, CSpine, XR of T/Lspine and XR of left elbow do not show acute abnormalities.  Recommend tylenol for pain and follow up as scheduled with PCP and orthopedic physician. Patient discharged in stable condition with understanding of reasons to return.    Gareth Morgan, MD 02/01/16 1250

## 2016-02-21 ENCOUNTER — Other Ambulatory Visit: Payer: Self-pay

## 2016-02-21 ENCOUNTER — Other Ambulatory Visit: Payer: Self-pay | Admitting: Endocrinology

## 2016-02-21 MED ORDER — ACCU-CHEK FASTCLIX LANCETS MISC: Status: DC

## 2016-03-27 ENCOUNTER — Ambulatory Visit: Payer: Medicare Other | Admitting: Endocrinology

## 2016-04-11 ENCOUNTER — Ambulatory Visit (INDEPENDENT_AMBULATORY_CARE_PROVIDER_SITE_OTHER): Payer: Medicare Other | Admitting: Endocrinology

## 2016-04-11 ENCOUNTER — Encounter: Payer: Self-pay | Admitting: Endocrinology

## 2016-04-11 VITALS — BP 132/88 | HR 78 | Temp 97.8°F | Ht 64.0 in | Wt 193.0 lb

## 2016-04-11 DIAGNOSIS — E119 Type 2 diabetes mellitus without complications: Secondary | ICD-10-CM

## 2016-04-11 LAB — POCT GLYCOSYLATED HEMOGLOBIN (HGB A1C): Hemoglobin A1C: 6.3

## 2016-04-11 NOTE — Progress Notes (Signed)
Subjective:    Patient ID: [name], female    DOB: [birth year], 75 y.o.   MRN: [id number]  HPI Pt returns for f/u of diabetes mellitus: DM type: 2 Dx'ed: 4332 Complications: none Therapy: 4 oral meds GDM: never DKA: never Severe hypoglycemia: never. Pancreatitis: never. Other: she stopped bromocriptine, as she said it caused hypoglycemia; she did not tolerate invokana, due to dizziness; I advised her to learn about insulin, just in case, but she declines.   Interval history:   no cbg record, but states cbg's are well-controlled.  pt states she feels well in general.  Past Medical History  Diagnosis Date   Arthritis    Depression    Diabetes mellitus without complication ([place])    Hypertension     Past Surgical History  Procedure Laterality Date   Abdominal hysterectomy     Gallbladder surgery  2005   Back surgery  2006   Knee surgery  2001/2005   Hernia repair      Social History   Social History   Marital Status: Unknown    Spouse Name: N/A   Number of Children: N/A   Years of Education: N/A   Occupational History   Not on file.   Social History Main Topics   Smoking status: Former Smoker   Smokeless tobacco: Not on file   Alcohol Use: Not on file   Drug Use: Not on file   Sexual Activity: Not on file   Other Topics Concern   Not on file   Social History Narrative    Current Outpatient Prescriptions on File Prior to Visit  Medication Sig Dispense Refill   acarbose (PRECOSE) 25 MG tablet Take 1 tablet by mouth  twice a day with meals 180 tablet 2   ACCU-CHEK FASTCLIX LANCETS MISC Check blood sugar two times daily 204 each 2   ACCU-CHEK SMARTVIEW test strip Use to test blood glucose  two times daily as directed 200 each 2   acetaminophen (TYLENOL) 325 MG tablet Take 650 mg by mouth every 6 (six) hours as needed for pain.     ALPRAZolam (XANAX) 1 MG tablet Take 1 mg by mouth at bedtime as needed for sleep.     atenolol  (TENORMIN) 25 MG tablet Take 25 mg by mouth daily.     Blood Glucose Monitoring Suppl (ACCU-CHEK NANO SMARTVIEW) W/DEVICE KIT Check blood sugar 1 time  per day 1 kit 2   CRESTOR 5 MG tablet Take 5 mg by mouth daily.     enalapril (VASOTEC) 5 MG tablet Take 5 mg by mouth daily.     glucose blood (ACCU-CHEK SMARTVIEW) test strip Use to test blood glucose 2 times daily, as instructed. Dx code: 250.00 200 each 2   JANUVIA 100 MG tablet Take 1 tablet by mouth  daily 90 tablet 2   Lancets Misc. (ACCU-CHEK FASTCLIX LANCET) KIT 1 each by Does not apply route daily. 1 kit 0   metFORMIN (GLUCOPHAGE-XR) 500 MG 24 hr tablet Take 2 tablets by mouth  twice a day (Patient taking differently: Take 3 tablets by mouth  twice a day) 360 tablet 2   methocarbamol (ROBAXIN) 500 MG tablet Take 500 mg by mouth 3 (three) times daily.     pioglitazone (ACTOS) 45 MG tablet Take 1 tablet by mouth  daily (Patient taking differently: Take 45 mg by mouth daily) 90 tablet 1   sertraline (ZOLOFT) 100 MG tablet Take 100 mg by mouth daily.  traMADol (ULTRAM) 50 MG tablet Take 50 mg by mouth every 6 (six) hours as needed for pain.     Vitamin D, Ergocalciferol, (DRISDOL) 50000 UNITS CAPS capsule Take 50,000 Units by mouth every 7 (seven) days.     No current facility-administered medications on file prior to visit.    Allergies  Allergen Reactions   Aspirin Other (See Comments)    Pt states her PCP told her not to take aspirin but she can not remember exactly why.   Codeine Nausea And Vomiting   Morphine And Related Nausea And Vomiting    Family History  Problem Relation Age of Onset   Arthritis Mother    Hyperlipidemia Mother    Heart disease Mother    Hypertension Mother    Diabetes Mother    Arthritis Father    Hyperlipidemia Father    Heart disease Father    Hypertension Father    Diabetes Father    Alcohol abuse Sister    Hypertension Sister    Cancer Sister     BP 132/88  mmHg   Pulse 78   Temp(Src) 97.8 F (36.6 C) (Oral)   Ht 5' 4"  (1.626 m)   Wt 193 lb (87.544 kg)   BMI 33.11 kg/m2   SpO2 95%   Review of Systems She denies hypoglycemia    Objective:   Physical Exam VITAL SIGNS:  See vs page GENERAL: no distress Pulses: dorsalis pedis intact bilat.   MSK: no deformity of the feet CV: no leg edema Skin:  no ulcer on the feet.  normal color and temp on the feet. Neuro: sensation is intact to touch on the feet.   Ext: There is bilateral onychomycosis of the toenails.    A1c=6.3%    Assessment & Plan:  DM: well-controlled.   Patient is advised the following: Patient Instructions  Please continue the same medications Please come back for a follow-up appointment in 4 months.  check your blood sugar once a day.  vary the time of day when you check, between before the 3 meals, and at bedtime.  also check if you have symptoms of your blood sugar being too high or too low.  please keep a record of the readings and bring it to your next appointment here (or you can bring the meter itself).  You can write it on any piece of paper.  please call us sooner if your blood sugar goes below 90, or if you have a lot of readings over 200.

## 2016-04-11 NOTE — Patient Instructions (Addendum)
Please continue the same medications Please come back for a follow-up appointment in 4 months.  check your blood sugar once a day.  vary the time of day when you check, between before the 3 meals, and at bedtime.  also check if you have symptoms of your blood sugar being too high or too low.  please keep a record of the readings and bring it to your next appointment here (or you can bring the meter itself).  You can write it on any piece of paper.  please call us sooner if your blood sugar goes below 90, or if you have a lot of readings over 200.

## 2016-07-14 ENCOUNTER — Other Ambulatory Visit: Payer: Self-pay | Admitting: Endocrinology

## 2016-08-12 ENCOUNTER — Ambulatory Visit: Payer: Medicare Other | Admitting: Endocrinology

## 2016-09-11 ENCOUNTER — Ambulatory Visit: Payer: Medicare Other | Admitting: Endocrinology

## 2016-09-23 ENCOUNTER — Ambulatory Visit: Payer: Medicare Other | Admitting: Endocrinology

## 2016-09-30 ENCOUNTER — Other Ambulatory Visit: Payer: Self-pay | Admitting: Family Medicine

## 2016-09-30 DIAGNOSIS — Z1231 Encounter for screening mammogram for malignant neoplasm of breast: Secondary | ICD-10-CM

## 2016-10-06 ENCOUNTER — Encounter: Payer: Self-pay | Admitting: Endocrinology

## 2016-10-06 ENCOUNTER — Ambulatory Visit (INDEPENDENT_AMBULATORY_CARE_PROVIDER_SITE_OTHER): Payer: Medicare Other | Admitting: Endocrinology

## 2016-10-06 VITALS — BP 138/70 | HR 92 | Ht 64.0 in | Wt 199.0 lb

## 2016-10-06 DIAGNOSIS — E119 Type 2 diabetes mellitus without complications: Secondary | ICD-10-CM

## 2016-10-06 LAB — POCT GLYCOSYLATED HEMOGLOBIN (HGB A1C): HEMOGLOBIN A1C: 8

## 2016-10-06 MED ORDER — EMPAGLIFLOZIN 10 MG PO TABS: 10.0000 mg | ORAL_TABLET | Freq: Every day | ORAL | 11 refills | Status: DC

## 2016-10-06 NOTE — Patient Instructions (Addendum)
I have sent a prescription to your pharmacy, for an additional diabetes medication.  Please come back for a follow-up appointment in 3 months.  check your blood sugar once a day.  vary the time of day when you check, between before the 3 meals, and at bedtime.  also check if you have symptoms of your blood sugar being too high or too low.  please keep a record of the readings and bring it to your next appointment here (or you can bring the meter itself).  You can write it on any piece of paper.  please call us sooner if your blood sugar goes below 90, or if you have a lot of readings over 200.

## 2016-10-06 NOTE — Progress Notes (Signed)
Subjective:    Patient ID: [name], female    DOB: 10/28/1941, 75 y.o.   MRN: [id number]  HPI Pt returns for f/u of diabetes mellitus: DM type: 2 Dx'ed: 4497 Complications: none Therapy: 4 oral meds.  GDM: never DKA: never Severe hypoglycemia: never. Pancreatitis: never. Other: she stopped bromocriptine, as she said it caused hypoglycemia; she did not tolerate invokana, due to dizziness; I advised her to learn about insulin, just in case, but she declines.  Interval history:   no cbg record, but states cbg's are well-controlled.  pt states she feels well in general.   Past Medical History:  Diagnosis Date   Arthritis    Depression    Diabetes mellitus without complication ([place])    Hypertension     Past Surgical History:  Procedure Laterality Date   ABDOMINAL HYSTERECTOMY     BACK SURGERY  2006   GALLBLADDER SURGERY  2005   HERNIA REPAIR     KNEE SURGERY  2001/2005    Social History   Social History   Marital status: Unknown    Spouse name: N/A   Number of children: N/A   Years of education: N/A   Occupational History   Not on file.   Social History Main Topics   Smoking status: Former Smoker   Smokeless tobacco: Not on file   Alcohol use Not on file   Drug use: Unknown   Sexual activity: Not on file   Other Topics Concern   Not on file   Social History Narrative   No narrative on file    Current Outpatient Prescriptions on File Prior to Visit  Medication Sig Dispense Refill   acarbose (PRECOSE) 25 MG tablet Take 1 tablet by mouth  twice a day with meals 180 tablet 3   ACCU-CHEK FASTCLIX LANCETS MISC Check blood sugar two times daily 204 each 2   ACCU-CHEK SMARTVIEW test strip Use to test blood glucose  two times daily as directed 200 each 2   acetaminophen (TYLENOL) 325 MG tablet Take 650 mg by mouth every 6 (six) hours as needed for pain.     ALPRAZolam (XANAX) 1 MG tablet Take 1 mg by mouth at bedtime as needed for  sleep.     atenolol (TENORMIN) 25 MG tablet Take 25 mg by mouth daily.     Blood Glucose Monitoring Suppl (ACCU-CHEK NANO SMARTVIEW) W/DEVICE KIT Check blood sugar 1 time  per day 1 kit 2   CRESTOR 5 MG tablet Take 5 mg by mouth daily.     enalapril (VASOTEC) 5 MG tablet Take 5 mg by mouth daily.     flurbiprofen (ANSAID) 100 MG tablet      glucose blood (ACCU-CHEK SMARTVIEW) test strip Use to test blood glucose 2 times daily, as instructed. Dx code: 250.00 200 each 2   JANUVIA 100 MG tablet Take 1 tablet by mouth  daily 90 tablet 2   Lancets Misc. (ACCU-CHEK FASTCLIX LANCET) KIT 1 each by Does not apply route daily. 1 kit 0   LYRICA 25 MG capsule      metFORMIN (GLUCOPHAGE-XR) 500 MG 24 hr tablet Take 2 tablets by mouth  twice a day (Patient taking differently: Take 3 tablets by mouth  twice a day) 360 tablet 2   methocarbamol (ROBAXIN) 500 MG tablet Take 500 mg by mouth 3 (three) times daily.     pioglitazone (ACTOS) 45 MG tablet Take 1 tablet by mouth  daily 90 tablet 3  sertraline (ZOLOFT) 100 MG tablet Take 100 mg by mouth daily.      traMADol (ULTRAM) 50 MG tablet Take 50 mg by mouth every 6 (six) hours as needed for pain.     Vitamin D, Ergocalciferol, (DRISDOL) 50000 UNITS CAPS capsule Take 50,000 Units by mouth every 7 (seven) days.     No current facility-administered medications on file prior to visit.     Allergies  Allergen Reactions   Aspirin Other (See Comments)    Pt states her PCP told her not to take aspirin but she can not remember exactly why.   Codeine Nausea And Vomiting   Morphine And Related Nausea And Vomiting    Family History  Problem Relation Age of Onset   Arthritis Mother    Hyperlipidemia Mother    Heart disease Mother    Hypertension Mother    Diabetes Mother    Arthritis Father    Hyperlipidemia Father    Heart disease Father    Hypertension Father    Diabetes Father    Alcohol abuse Sister    Hypertension Sister     Cancer Sister     BP 138/70    Pulse 92    Ht 5' 4"  (1.626 m)    Wt 199 lb (90.3 kg)    SpO2 92%    BMI 34.16 kg/m   Review of Systems Denies leg edema.     Objective:   Physical Exam VITAL SIGNS:  See vs page GENERAL: no distress Pulses: dorsalis pedis intact bilat.   MSK: no deformity of the feet.  CV: no leg edema Skin:  no ulcer on the feet.  normal color and temp on the feet. Neuro: sensation is intact to touch on the feet.   Ext: There is bilateral onychomycosis of the toenails.    A1c=8.0%     Assessment & Plan:  Type 2 DM: worse Neck pain: steroid injections are worsening glycemic control.   Dizziness: this is limiting oral DM rx options.  Patient is advised the following: Patient Instructions  I have sent a prescription to your pharmacy, for an additional diabetes medication.  Please come back for a follow-up appointment in 3 months.  check your blood sugar once a day.  vary the time of day when you check, between before the 3 meals, and at bedtime.  also check if you have symptoms of your blood sugar being too high or too low.  please keep a record of the readings and bring it to your next appointment here (or you can bring the meter itself).  You can write it on any piece of paper.  please call us sooner if your blood sugar goes below 90, or if you have a lot of readings over 200.

## 2016-10-08 ENCOUNTER — Telehealth: Payer: Self-pay | Admitting: Endocrinology

## 2016-10-08 MED ORDER — EMPAGLIFLOZIN 10 MG PO TABS: 10.0000 mg | ORAL_TABLET | Freq: Every day | ORAL | 11 refills | Status: DC

## 2016-10-08 NOTE — Telephone Encounter (Signed)
Patient pharmacy stated her medication for he diabetes have not been sent to th pharmacy yet.  She did not know the name of it.    [place], [place] - [address] [phone] (Phone) [phone] (Fax)

## 2016-10-08 NOTE — Telephone Encounter (Signed)
Refill submitted.

## 2017-01-06 ENCOUNTER — Ambulatory Visit: Payer: Medicare Other | Admitting: Endocrinology

## 2017-04-06 ENCOUNTER — Ambulatory Visit: Payer: Medicare Other | Admitting: Endocrinology

## 2017-05-04 ENCOUNTER — Telehealth: Payer: Self-pay | Admitting: Endocrinology

## 2017-05-04 NOTE — Telephone Encounter (Signed)
**  Remind patient they can make refill requests via MyChart**  Medication refill request (Name & Dosage): JANUVIA 100 MG tablet 3 month supply pioglitazone (ACTOS) 45 MG tablet  Preferred pharmacy (Name & Address):  [place], [place] - [address] [phone] (Phone) [phone] (Fax)      Other comments (if applicable):   Please call patient once rx is placed. Patient has been out sick. Patient will call back to reschedule appointments.

## 2017-05-05 ENCOUNTER — Other Ambulatory Visit: Payer: Self-pay | Admitting: Endocrinology

## 2017-05-05 MED ORDER — PIOGLITAZONE HCL 45 MG PO TABS: 45.0000 mg | ORAL_TABLET | Freq: Every day | ORAL | 3 refills | Status: DC

## 2017-05-05 MED ORDER — SITAGLIPTIN PHOSPHATE 100 MG PO TABS: 100.0000 mg | ORAL_TABLET | Freq: Every day | ORAL | 2 refills | Status: DC

## 2017-05-05 NOTE — Telephone Encounter (Signed)
Medications were refilled to pt's preferred pharmacy.

## 2017-05-05 NOTE — Telephone Encounter (Signed)
Thank you!

## 2017-05-06 ENCOUNTER — Other Ambulatory Visit: Payer: Self-pay

## 2017-05-06 MED ORDER — SITAGLIPTIN PHOSPHATE 100 MG PO TABS: 100.0000 mg | ORAL_TABLET | Freq: Every day | ORAL | 2 refills | Status: DC

## 2017-05-06 MED ORDER — PIOGLITAZONE HCL 45 MG PO TABS: 45.0000 mg | ORAL_TABLET | Freq: Every day | ORAL | 3 refills | Status: DC

## 2017-05-06 NOTE — Telephone Encounter (Signed)
Resubmitted.

## 2017-05-06 NOTE — Telephone Encounter (Signed)
Patient is calling to notify that [facility] did not receive the script for Novo and Piog.  Please call pt or resend.  Thank you,  -LL

## 2017-07-23 ENCOUNTER — Other Ambulatory Visit: Payer: Self-pay

## 2017-07-23 ENCOUNTER — Ambulatory Visit (INDEPENDENT_AMBULATORY_CARE_PROVIDER_SITE_OTHER): Payer: Medicare Other | Admitting: Endocrinology

## 2017-07-23 ENCOUNTER — Encounter: Payer: Self-pay | Admitting: Endocrinology

## 2017-07-23 ENCOUNTER — Telehealth: Payer: Self-pay

## 2017-07-23 VITALS — BP 142/88 | HR 72 | Wt 192.2 lb

## 2017-07-23 DIAGNOSIS — E119 Type 2 diabetes mellitus without complications: Secondary | ICD-10-CM

## 2017-07-23 LAB — POCT GLYCOSYLATED HEMOGLOBIN (HGB A1C): Hemoglobin A1C: 8.8

## 2017-07-23 MED ORDER — METFORMIN HCL ER 500 MG PO TB24: 1000.0000 mg | ORAL_TABLET | Freq: Two times a day (BID) | ORAL | 2 refills | Status: DC

## 2017-07-23 MED ORDER — GLUCOSE BLOOD VI STRP: ORAL_STRIP | 2 refills | Status: DC

## 2017-07-23 MED ORDER — REPAGLINIDE 0.5 MG PO TABS: 0.5000 mg | ORAL_TABLET | Freq: Two times a day (BID) | ORAL | 11 refills | Status: DC

## 2017-07-23 MED ORDER — ACCU-CHEK FASTCLIX LANCETS MISC: 2 refills | Status: DC

## 2017-07-23 NOTE — Telephone Encounter (Signed)
Called [facility] for them to send over labs from July 3rd

## 2017-07-23 NOTE — Patient Instructions (Addendum)
I have sent a prescription to your pharmacy, to add "repaglinide." Please continue the same other diabetes medications. Please come back for a follow-up appointment in 2 months.  Please see Jalexia the same day, to learn about insulin, just in case. check your blood sugar once a day.  vary the time of day when you check, between before the 3 meals, and at bedtime.  also check if you have symptoms of your blood sugar being too high or too low.  please keep a record of the readings and bring it to your next appointment here (or you can bring the meter itself).  You can write it on any piece of paper.  please call us sooner if your blood sugar goes below 70, or if you have a lot of readings over 200.

## 2017-07-23 NOTE — Progress Notes (Signed)
Subjective:    Patient ID: [name], female    DOB: November 10, 1941, 76 y.o.   MRN: [id number]  HPI Pt returns for f/u of diabetes mellitus: DM type: 2 Dx'ed: 1993 Complications: none Therapy: 5 oral meds.  GDM: never DKA: never Severe hypoglycemia: never.   Pancreatitis: never. Other: she stopped bromocriptine, as she said it caused hypoglycemia; she did not tolerate invokana, due to dizziness; I advised her to learn about insulin, just in case, but she declines.  Interval history:   no cbg record, but states cbg's are in the high-100's.  pt states she feels well in general.  She takes 5 DM meds as rx'ed.   Past Medical History:  Diagnosis Date  . Arthritis   . Depression   . Diabetes mellitus without complication (HCC)   . Hypertension     Past Surgical History:  Procedure Laterality Date  . ABDOMINAL HYSTERECTOMY    . BACK SURGERY  2006  . GALLBLADDER SURGERY  2005  . HERNIA REPAIR    . KNEE SURGERY  2001/2005    Social History   Social History  . Marital status: Unknown    Spouse name: N/A  . Number of children: N/A  . Years of education: N/A   Occupational History  . Not on file.   Social History Main Topics  . Smoking status: Former Games developer  . Smokeless tobacco: Not on file  . Alcohol use Not on file  . Drug use: Unknown  . Sexual activity: Not on file   Other Topics Concern  . Not on file   Social History Narrative  . No narrative on file    Current Outpatient Prescriptions on File Prior to Visit  Medication Sig Dispense Refill  . acarbose (PRECOSE) 25 MG tablet Take 1 tablet by mouth  twice a day with meals 180 tablet 3  . acetaminophen (TYLENOL) 325 MG tablet Take 650 mg by mouth every 6 (six) hours as needed for pain.    Marland Kitchen ALPRAZolam (XANAX) 1 MG tablet Take 1 mg by mouth at bedtime as needed for sleep.    Marland Kitchen atenolol (TENORMIN) 25 MG tablet Take 25 mg by mouth daily.    . Blood Glucose Monitoring Suppl (ACCU-CHEK NANO SMARTVIEW) W/DEVICE KIT  Check blood sugar 1 time  per day 1 kit 2  . CRESTOR 5 MG tablet Take 5 mg by mouth daily.    . empagliflozin (JARDIANCE) 10 MG TABS tablet Take 10 mg by mouth daily. 30 tablet 11  . enalapril (VASOTEC) 5 MG tablet Take 5 mg by mouth daily.    . flurbiprofen (ANSAID) 100 MG tablet     . glucose blood (ACCU-CHEK SMARTVIEW) test strip Use to test blood glucose 2 times daily, as instructed. Dx code: 250.00 200 each 2  . Lancets Misc. (ACCU-CHEK FASTCLIX LANCET) KIT 1 each by Does not apply route daily. 1 kit 0  . LYRICA 25 MG capsule     . methocarbamol (ROBAXIN) 500 MG tablet Take 500 mg by mouth 3 (three) times daily.    . pioglitazone (ACTOS) 45 MG tablet Take 1 tablet (45 mg total) by mouth daily. 90 tablet 3  . sertraline (ZOLOFT) 100 MG tablet Take 100 mg by mouth daily.     . sitaGLIPtin (JANUVIA) 100 MG tablet Take 1 tablet (100 mg total) by mouth daily. 90 tablet 2  . traMADol (ULTRAM) 50 MG tablet Take 50 mg by mouth every 6 (six) hours as needed for pain.    Marland Kitchen  Vitamin D, Ergocalciferol, (DRISDOL) 50000 UNITS CAPS capsule Take 50,000 Units by mouth every 7 (seven) days.     No current facility-administered medications on file prior to visit.     Allergies  Allergen Reactions  . Aspirin Other (See Comments)    Pt states her PCP told her not to take aspirin but she can not remember exactly why.  . Codeine Nausea And Vomiting  . Morphine And Related Nausea And Vomiting    Family History  Problem Relation Age of Onset  . Arthritis Mother   . Hyperlipidemia Mother   . Heart disease Mother   . Hypertension Mother   . Diabetes Mother   . Arthritis Father   . Hyperlipidemia Father   . Heart disease Father   . Hypertension Father   . Diabetes Father   . Alcohol abuse Sister   . Hypertension Sister   . Cancer Sister     BP (!) 142/88   Pulse 72   Wt 192 lb 3.2 oz (87.2 kg)   SpO2 97%   BMI 32.99 kg/m    Review of Systems She denies hypoglycemia.      Objective:    Physical Exam VITAL SIGNS:  See vs page GENERAL: no distress Pulses: foot pulses are intact bilaterally.   MSK: no deformity of the feet or ankles.  CV: no edema of the legs or ankles Skin:  no ulcer on the feet or ankles.  normal color and temp on the feet and ankles Neuro: sensation is intact to touch on the feet and ankles.   Ext: There is bilateral onychomycosis of the toenails.    A1c=8.8%     Assessment & Plan:  Type 2 DM: she needs increased rx.    Patient Instructions  I have sent a prescription to your pharmacy, to add "repaglinide." Please continue the same other diabetes medications. Please come back for a follow-up appointment in 2 months.  Please see Shellia the same day, to learn about insulin, just in case. check your blood sugar once a day.  vary the time of day when you check, between before the 3 meals, and at bedtime.  also check if you have symptoms of your blood sugar being too high or too low.  please keep a record of the readings and bring it to your next appointment here (or you can bring the meter itself).  You can write it on any piece of paper.  please call us sooner if your blood sugar goes below 70, or if you have a lot of readings over 200.

## 2017-09-22 ENCOUNTER — Ambulatory Visit: Payer: Medicare Other | Admitting: Endocrinology

## 2017-10-12 ENCOUNTER — Other Ambulatory Visit: Payer: Self-pay

## 2018-01-01 ENCOUNTER — Other Ambulatory Visit: Payer: Self-pay | Admitting: Endocrinology

## 2018-02-11 ENCOUNTER — Telehealth: Payer: Self-pay | Admitting: Endocrinology

## 2018-02-11 NOTE — Telephone Encounter (Signed)
Patient stated that due to her hand & stomach virus she was dealing with that she couldn't make appt. right now. She is going to call back first of the week to make appt. based on how she is feeling.

## 2018-02-11 NOTE — Telephone Encounter (Signed)
Please advise ov.  We can check a1c, and also see if you qualify.

## 2018-02-11 NOTE — Telephone Encounter (Signed)
Patient states she has had hand surgery, she says she is unable to check her blood sugar and has not been able to come in to see him because of this.  She would like to know if she can get  freestyle libra.    Please advise   [id number] - [place], [place] - [place]

## 2018-02-23 ENCOUNTER — Other Ambulatory Visit: Payer: Self-pay

## 2018-02-23 MED ORDER — SERTRALINE HCL 100 MG PO TABS: 100.0000 mg | ORAL_TABLET | Freq: Every day | ORAL | 3 refills | Status: DC

## 2018-02-23 MED ORDER — ENALAPRIL MALEATE 5 MG PO TABS: 5.0000 mg | ORAL_TABLET | Freq: Every day | ORAL | 3 refills | Status: DC

## 2018-02-23 MED ORDER — ATENOLOL 25 MG PO TABS: 25.0000 mg | ORAL_TABLET | Freq: Every day | ORAL | 3 refills | Status: DC

## 2018-02-23 MED ORDER — METFORMIN HCL ER 500 MG PO TB24: 1000.0000 mg | ORAL_TABLET | Freq: Two times a day (BID) | ORAL | 2 refills | Status: DC

## 2018-08-17 ENCOUNTER — Other Ambulatory Visit: Payer: Self-pay

## 2018-08-17 ENCOUNTER — Telehealth: Payer: Self-pay | Admitting: Endocrinology

## 2018-08-17 MED ORDER — METFORMIN HCL ER 500 MG PO TB24: 1000.0000 mg | ORAL_TABLET | Freq: Two times a day (BID) | ORAL | 2 refills | Status: DC

## 2018-08-17 MED ORDER — REPAGLINIDE 0.5 MG PO TABS: 0.5000 mg | ORAL_TABLET | Freq: Two times a day (BID) | ORAL | 11 refills | Status: DC

## 2018-08-17 MED ORDER — SITAGLIPTIN PHOSPHATE 100 MG PO TABS: 100.0000 mg | ORAL_TABLET | Freq: Every day | ORAL | 2 refills | Status: DC

## 2018-08-17 NOTE — Telephone Encounter (Signed)
done

## 2018-08-17 NOTE — Telephone Encounter (Signed)
Pt needs refills on januvia 100 mg the second is [facility]aglinide [facility] [address]

## 2018-08-17 NOTE — Telephone Encounter (Signed)
Created in error

## 2018-09-15 ENCOUNTER — Ambulatory Visit (INDEPENDENT_AMBULATORY_CARE_PROVIDER_SITE_OTHER): Payer: Medicare Other | Admitting: Endocrinology

## 2018-09-15 ENCOUNTER — Encounter: Payer: Self-pay | Admitting: Endocrinology

## 2018-09-15 ENCOUNTER — Telehealth: Payer: Self-pay | Admitting: Family Medicine

## 2018-09-15 VITALS — BP 128/68 | HR 83 | Ht 61.5 in | Wt 195.8 lb

## 2018-09-15 DIAGNOSIS — E119 Type 2 diabetes mellitus without complications: Secondary | ICD-10-CM | POA: Diagnosis not present

## 2018-09-15 LAB — POCT GLYCOSYLATED HEMOGLOBIN (HGB A1C): Hemoglobin A1C: 10.2 % — AB (ref 4.0–5.6)

## 2018-09-15 MED ORDER — EMPAGLIFLOZIN 25 MG PO TABS: 25.0000 mg | ORAL_TABLET | Freq: Every day | ORAL | 11 refills | Status: DC

## 2018-09-15 MED ORDER — REPAGLINIDE 2 MG PO TABS: 2.0000 mg | ORAL_TABLET | Freq: Three times a day (TID) | ORAL | 11 refills | Status: DC

## 2018-09-15 MED ORDER — GLUCOSE BLOOD VI STRP: 1.0000 | ORAL_STRIP | Freq: Every day | 3 refills | Status: DC

## 2018-09-15 MED ORDER — ACARBOSE 50 MG PO TABS: 50.0000 mg | ORAL_TABLET | Freq: Three times a day (TID) | ORAL | 11 refills | Status: DC

## 2018-09-15 NOTE — Telephone Encounter (Signed)
FYI At checked pt didn't want to schedule appointment with [place] or [place].  She did schedule 3 week follow up with you

## 2018-09-15 NOTE — Patient Instructions (Addendum)
I have sent prescriptions to your pharmacy, to increase the repaglinide, jardiance, and acarbose.   Please continue the same other diabetes medications.   Please come back for a follow-up appointment in 3 weeks.   Please see Chauncey or Mickel Baas, the same day.   check your blood sugar once a day.  vary the time of day when you check, between before the 3 meals, and at bedtime.  also check if you have symptoms of your blood sugar being too high or too low.  please keep a record of the readings and bring it to your next appointment here (or you can bring the meter itself).  You can write it on any piece of paper.  please call us sooner if your blood sugar goes below 70, or if you have a lot of readings over 200.

## 2018-09-15 NOTE — Progress Notes (Signed)
Subjective:    Patient ID: [name], female    DOB: Dec 08, 1940, 77 y.o.   MRN: [id number]  HPI Pt returns for f/u of diabetes mellitus: DM type: 2 Dx'ed: 1282 Complications: none Therapy: 5 oral meds.  GDM: never DKA: never Severe hypoglycemia: never.   Pancreatitis: never. Other: she stopped bromocriptine, as she said it caused hypoglycemia; she did not tolerate invokana, due to dizziness; I advised her to learn about insulin, just in case, but she declines.  Interval history:   no cbg record, but states cbg's are in the high-100's.  pt states she feels well in general.  She is overdue for f/u, but she says she takes 5 DM meds as rx'ed.  She says cbg's vary from 195-200's.  She got a steroid injection into the neck last month.   Past Medical History:  Diagnosis Date   Arthritis    Depression    Diabetes mellitus without complication ([place])    Hypertension     Past Surgical History:  Procedure Laterality Date   ABDOMINAL HYSTERECTOMY     BACK SURGERY  2006   GALLBLADDER SURGERY  2005   HERNIA REPAIR     KNEE SURGERY  2001/2005    Social History   Socioeconomic History   Marital status: Unknown    Spouse name: Not on file   Number of children: Not on file   Years of education: Not on file   Highest education level: Not on file  Occupational History   Not on file  Social Needs   Financial resource strain: Not on file   Food insecurity:    Worry: Not on file    Inability: Not on file   Transportation needs:    Medical: Not on file    Non-medical: Not on file  Tobacco Use   Smoking status: Former Smoker    Packs/day: 0.05    Years: 3.00    Pack years: 0.15    Types: Cigarettes    Last attempt to quit: 1980    Years since quitting: 39.8   Smokeless tobacco: Never Used  Substance and Sexual Activity   Alcohol use: Not on file   Drug use: Not on file   Sexual activity: Not on file  Lifestyle   Physical activity:    Days per week:  Not on file    Minutes per session: Not on file   Stress: Not on file  Relationships   Social connections:    Talks on phone: Not on file    Gets together: Not on file    Attends religious service: Not on file    Active member of club or organization: Not on file    Attends meetings of clubs or organizations: Not on file    Relationship status: Not on file   Intimate partner violence:    Fear of current or ex partner: Not on file    Emotionally abused: Not on file    Physically abused: Not on file    Forced sexual activity: Not on file  Other Topics Concern   Not on file  Social History Narrative   Not on file    Current Outpatient Medications on File Prior to Visit  Medication Sig Dispense Refill   ACCU-CHEK FASTCLIX LANCETS MISC Check blood sugar two times daily 204 each 2   acetaminophen (TYLENOL) 325 MG tablet Take 650 mg by mouth every 6 (six) hours as needed for pain.     ALPRAZolam Duanne Moron)  1 MG tablet Take 1 mg by mouth at bedtime as needed for sleep.     atenolol (TENORMIN) 25 MG tablet Take 1 tablet (25 mg total) by mouth daily. 90 tablet 3   CRESTOR 5 MG tablet Take 5 mg by mouth daily.     enalapril (VASOTEC) 5 MG tablet Take 1 tablet (5 mg total) by mouth daily. 90 tablet 3   flurbiprofen (ANSAID) 100 MG tablet      glucose blood (ACCU-CHEK SMARTVIEW) test strip Use to test blood glucose 2 times daily, as instructed. Dx code: 250.00 200 each 2   glucose blood (ACCU-CHEK SMARTVIEW) test strip Use to test blood glucose  two times daily as directed 200 each 2   Lancets Misc. (ACCU-CHEK FASTCLIX LANCET) KIT 1 each by Does not apply route daily. 1 kit 0   metFORMIN (GLUCOPHAGE-XR) 500 MG 24 hr tablet Take 2 tablets (1,000 mg total) by mouth 2 (two) times daily. 360 tablet 2   methocarbamol (ROBAXIN) 500 MG tablet Take 500 mg by mouth 3 (three) times daily.     pioglitazone (ACTOS) 45 MG tablet Take 1 tablet (45 mg total) by mouth daily. 90 tablet 3    sertraline (ZOLOFT) 100 MG tablet Take 1 tablet (100 mg total) by mouth daily. (Patient taking differently: Take 100 mg by mouth as needed. ) 90 tablet 3   sitaGLIPtin (JANUVIA) 100 MG tablet Take 1 tablet (100 mg total) by mouth daily. 90 tablet 2   traMADol (ULTRAM) 50 MG tablet Take 50 mg by mouth every 6 (six) hours as needed for pain.     Vitamin D, Ergocalciferol, (DRISDOL) 50000 UNITS CAPS capsule Take 50,000 Units by mouth every 7 (seven) days.     LYRICA 25 MG capsule      No current facility-administered medications on file prior to visit.     Allergies  Allergen Reactions   Aspirin Other (See Comments)    Pt states her PCP told her not to take aspirin but she can not remember exactly why.   Codeine Nausea And Vomiting   Morphine And Related Nausea And Vomiting    Family History  Problem Relation Age of Onset   Arthritis Mother    Hyperlipidemia Mother    Heart disease Mother    Hypertension Mother    Diabetes Mother    Arthritis Father    Hyperlipidemia Father    Heart disease Father    Hypertension Father    Diabetes Father    Alcohol abuse Sister    Hypertension Sister    Cancer Sister     BP 128/68 (BP Location: Right Arm, Patient Position: Sitting, Cuff Size: Large)    Pulse 83    Ht 5' 1.5" (1.562 m)    Wt 195 lb 12.8 oz (88.8 kg)    SpO2 97%    BMI 36.40 kg/m    Review of Systems She denies hypoglycemia.      Objective:   Physical Exam VITAL SIGNS:  See vs page GENERAL: no distress Pulses: foot pulses are intact bilaterally.   MSK: no deformity of the feet or ankles.  CV: trace bilat edema of the legs, and bilat vv's.   Skin:  no ulcer on the feet or ankles.  normal color and temp on the feet and ankles.   Neuro: sensation is intact to touch on the feet and ankles.   Ext: There is bilateral onychomycosis of the toenails.     Lab Results  Component  Value Date   HGBA1C 10.2 (A) 09/15/2018       Assessment & Plan:  Type 2  DM: worse Neck pain: steroid rx is affecting a1c, but would not account for this degree of a1c increase.   Patient Instructions  I have sent prescriptions to your pharmacy, to increase the repaglinide, jardiance, and acarbose.   Please continue the same other diabetes medications.   Please come back for a follow-up appointment in 3 weeks.   Please see Kelena or Mickel Baas, the same day.   check your blood sugar once a day.  vary the time of day when you check, between before the 3 meals, and at bedtime.  also check if you have symptoms of your blood sugar being too high or too low.  please keep a record of the readings and bring it to your next appointment here (or you can bring the meter itself).  You can write it on any piece of paper.  please call us sooner if your blood sugar goes below 70, or if you have a lot of readings over 200.

## 2018-09-20 ENCOUNTER — Telehealth: Payer: Self-pay | Admitting: Endocrinology

## 2018-09-20 NOTE — Telephone Encounter (Signed)
Pt stated that she is sick with a cold also but believes her #'s have been dropping. Readings have been 186,169,109,114,147,156,110, and 134. Pt  Stated that her normal is about 120 or higher and that she feels bad below 120,she thinks that she may feel bad due to medication adjustments made during last visit, please advise

## 2018-09-20 NOTE — Telephone Encounter (Signed)
lft vm with these instructions

## 2018-09-20 NOTE — Telephone Encounter (Signed)
Patient requests Nurse call her at ph# [phone] re: blood sugars have been dropping down and clarification on patient's medications & dosage

## 2018-09-20 NOTE — Telephone Encounter (Signed)
Med list is correct.  These numbers are good.  Please continue the same medications.

## 2018-10-06 ENCOUNTER — Ambulatory Visit (INDEPENDENT_AMBULATORY_CARE_PROVIDER_SITE_OTHER): Payer: Medicare Other | Admitting: Endocrinology

## 2018-10-06 ENCOUNTER — Telehealth: Payer: Self-pay | Admitting: Endocrinology

## 2018-10-06 ENCOUNTER — Other Ambulatory Visit: Payer: Self-pay

## 2018-10-06 ENCOUNTER — Encounter: Payer: Self-pay | Admitting: Endocrinology

## 2018-10-06 VITALS — BP 132/70 | HR 91 | Ht 61.5 in | Wt 194.4 lb

## 2018-10-06 DIAGNOSIS — E119 Type 2 diabetes mellitus without complications: Secondary | ICD-10-CM

## 2018-10-06 MED ORDER — SITAGLIPTIN PHOSPHATE 100 MG PO TABS: 100.0000 mg | ORAL_TABLET | Freq: Every day | ORAL | 2 refills | Status: DC

## 2018-10-06 MED ORDER — PIOGLITAZONE HCL 45 MG PO TABS: 45.0000 mg | ORAL_TABLET | Freq: Every day | ORAL | 3 refills | Status: DC

## 2018-10-06 MED ORDER — ACARBOSE 25 MG PO TABS: 25.0000 mg | ORAL_TABLET | Freq: Three times a day (TID) | ORAL | 11 refills | Status: DC

## 2018-10-06 NOTE — Progress Notes (Signed)
Subjective:    Patient ID: [name], female    DOB: Nov 25, 1941, 77 y.o.   MRN: [id number]  HPI Pt returns for f/u of diabetes mellitus: DM type: 2 Dx'ed: 1993 Complications: none Therapy: 5 oral meds.  GDM: never DKA: never Severe hypoglycemia: never.   Pancreatitis: never. Other: she stopped bromocriptine, as she said it caused hypoglycemia; she did not tolerate invokana, due to dizziness; I advised her to learn about insulin, just in case, but she declines.  Interval history:  she brings a record of her cbg's which I have reviewed today.  cbg varies from 90-186.  pt states she feels well in general.  she says she takes 5 DM meds as rx'ed.  Main symptom is flatulence.   Past Medical History:  Diagnosis Date  . Arthritis   . Depression   . Diabetes mellitus without complication ([facility])   . Hypertension     Past Surgical History:  Procedure Laterality Date  . ABDOMINAL HYSTERECTOMY    . BACK SURGERY  2006  . GALLBLADDER SURGERY  2005  . HERNIA REPAIR    . KNEE SURGERY  2001/2005    Social History   Socioeconomic History  . Marital status: Unknown    Spouse name: Not on file  . Number of children: Not on file  . Years of education: Not on file  . Highest education level: Not on file  Occupational History  . Not on file  Social Needs  . Financial resource strain: Not on file  . Food insecurity:    Worry: Not on file    Inability: Not on file  . Transportation needs:    Medical: Not on file    Non-medical: Not on file  Tobacco Use  . Smoking status: Former Smoker    Packs/day: 0.05    Years: 3.00    Pack years: 0.15    Types: Cigarettes    Last attempt to quit: 1980    Years since quitting: 39.8  . Smokeless tobacco: Never Used  Substance and Sexual Activity  . Alcohol use: Not on file  . Drug use: Not on file  . Sexual activity: Not on file  Lifestyle  . Physical activity:    Days per week: Not on file    Minutes per session: Not on file  .  Stress: Not on file  Relationships  . Social connections:    Talks on phone: Not on file    Gets together: Not on file    Attends religious service: Not on file    Active member of club or organization: Not on file    Attends meetings of clubs or organizations: Not on file    Relationship status: Not on file  . Intimate partner violence:    Fear of current or ex partner: Not on file    Emotionally abused: Not on file    Physically abused: Not on file    Forced sexual activity: Not on file  Other Topics Concern  . Not on file  Social History Narrative  . Not on file    Current Outpatient Medications on File Prior to Visit  Medication Sig Dispense Refill  . ACCU-CHEK FASTCLIX LANCETS MISC Check blood sugar two times daily 204 each 2  . acetaminophen (TYLENOL) 325 MG tablet Take 650 mg by mouth every 6 (six) hours as needed for pain.    Marland Kitchen ALPRAZolam (XANAX) 1 MG tablet Take 1 mg by mouth at bedtime as needed for sleep.    Marland Kitchen  atenolol (TENORMIN) 25 MG tablet Take 1 tablet (25 mg total) by mouth daily. 90 tablet 3  . CRESTOR 5 MG tablet Take 5 mg by mouth daily.    . empagliflozin (JARDIANCE) 25 MG TABS tablet Take 25 mg by mouth daily. 30 tablet 11  . enalapril (VASOTEC) 5 MG tablet Take 1 tablet (5 mg total) by mouth daily. 90 tablet 3  . flurbiprofen (ANSAID) 100 MG tablet     . glucose blood (ACCU-CHEK GUIDE) test strip 1 each by Other route daily. And lancets 1/day 100 each 3  . glucose blood (ACCU-CHEK SMARTVIEW) test strip Use to test blood glucose 2 times daily, as instructed. Dx code: 250.00 200 each 2  . glucose blood (ACCU-CHEK SMARTVIEW) test strip Use to test blood glucose  two times daily as directed 200 each 2  . Lancets Misc. (ACCU-CHEK FASTCLIX LANCET) KIT 1 each by Does not apply route daily. 1 kit 0  . metFORMIN (GLUCOPHAGE-XR) 500 MG 24 hr tablet Take 2 tablets (1,000 mg total) by mouth 2 (two) times daily. (Patient taking differently: Take 1,000 mg by mouth 3 (three)  times daily. ) 360 tablet 2  . methocarbamol (ROBAXIN) 500 MG tablet Take 500 mg by mouth 3 (three) times daily.    . repaglinide (PRANDIN) 2 MG tablet Take 1 tablet (2 mg total) by mouth 3 (three) times daily before meals. 90 tablet 11  . traMADol (ULTRAM) 50 MG tablet Take 50 mg by mouth every 6 (six) hours as needed for pain.    . Vitamin D, Ergocalciferol, (DRISDOL) 50000 UNITS CAPS capsule Take 50,000 Units by mouth every 7 (seven) days.     No current facility-administered medications on file prior to visit.     Allergies  Allergen Reactions  . Aspirin Other (See Comments)    Pt states her PCP told her not to take aspirin but she can not remember exactly why.  . Codeine Nausea And Vomiting  . Morphine And Related Nausea And Vomiting    Family History  Problem Relation Age of Onset  . Arthritis Mother   . Hyperlipidemia Mother   . Heart disease Mother   . Hypertension Mother   . Diabetes Mother   . Arthritis Father   . Hyperlipidemia Father   . Heart disease Father   . Hypertension Father   . Diabetes Father   . Alcohol abuse Sister   . Hypertension Sister   . Cancer Sister     BP 132/70 (BP Location: Left Arm, Patient Position: Sitting, Cuff Size: Large)   Pulse 91   Ht 5' 1.5" (1.562 m)   Wt 194 lb 6.4 oz (88.2 kg)   SpO2 95%   BMI 36.14 kg/m   Review of Systems She denies hypoglycemia    Objective:   Physical Exam VITAL SIGNS:  See vs page GENERAL: no distress Pulses: foot pulses are intact bilaterally.   MSK: no deformity of the feet or ankles.  CV: trace bilat edema of the legs, and bilat vv's.   Skin:  no ulcer on the feet or ankles.  normal color and temp on the feet and ankles.   Neuro: sensation is intact to touch on the feet and ankles.   Ext: There is bilateral onychomycosis of the toenails.    Lab Results  Component Value Date   CREATININE 0.64 10/19/2014   BUN 9 10/19/2014   NA 135 10/19/2014   K 4.4 10/19/2014   CL 97 10/19/2014   CO2  26 10/19/2014    Lab Results  Component Value Date   HGBA1C 10.2 (A) 09/15/2018      Assessment & Plan:  Type 2 DM: Much better.   Flatulence: new.  This is limiting acarbose dosage.   Patient Instructions  I have sent prescriptions to your pharmacy, to reduce the acarbose.   Please continue the same other diabetes medications.   Please come back for a follow-up appointment in 2 months.    Please see Najae or Vernona Rieger, the same day.   check your blood sugar once a day.  vary the time of day when you check, between before the 3 meals, and at bedtime.  also check if you have symptoms of your blood sugar being too high or too low.  please keep a record of the readings and bring it to your next appointment here (or you can bring the meter itself).  You can write it on any piece of paper.  please call us sooner if your blood sugar goes below 70, or if you have a lot of readings over 200.

## 2018-10-06 NOTE — Telephone Encounter (Signed)
Called pt to inform that she has refills on her Jardiance. Advised she call her pharmacy to request refills. Pt further added she only wanted generic to reduce cost. Informed that all Rx's are written so pharmacy can fill generic rather than brand. Again advised she call her pharmacy to further discuss her requests. Advised they will send to [place] electronically for processing.

## 2018-10-06 NOTE — Telephone Encounter (Signed)
Patient requests RX (90 day supply-all generic) for each of the following RX's:  empagliflozin (JARDIANCE) 25 MG TABS tablet  pioglitazone (ACTOS) 45 MG tablet  sitaGLIPtin (JANUVIA) 100 MG tablet  [facility] [address]

## 2018-10-06 NOTE — Patient Instructions (Addendum)
I have sent prescriptions to your pharmacy, to reduce the acarbose.   Please continue the same other diabetes medications.   Please come back for a follow-up appointment in 2 months.    Please see Kalaysia or Mickel Baas, the same day.   check your blood sugar once a day.  vary the time of day when you check, between before the 3 meals, and at bedtime.  also check if you have symptoms of your blood sugar being too high or too low.  please keep a record of the readings and bring it to your next appointment here (or you can bring the meter itself).  You can write it on any piece of paper.  please call us sooner if your blood sugar goes below 70, or if you have a lot of readings over 200.

## 2018-11-05 ENCOUNTER — Telehealth: Payer: Self-pay | Admitting: Endocrinology

## 2018-11-05 ENCOUNTER — Other Ambulatory Visit: Payer: Self-pay

## 2018-11-05 MED ORDER — ACCU-CHEK FASTCLIX LANCET KIT: 1.0000 | PACK | Freq: Every day | 0 refills | Status: DC

## 2018-11-05 MED ORDER — GLUCOSE BLOOD VI STRP: ORAL_STRIP | 2 refills | Status: DC

## 2018-11-05 NOTE — Telephone Encounter (Signed)
Rx sent

## 2018-11-05 NOTE — Telephone Encounter (Signed)
MEDICATION: glucose blood (ACCU-CHEK SMARTVIEW) test strip  Lancets Misc. (ACCU-CHEK FASTCLIX LANCET) KIT PHARMACY:  [id number] [place], [place]  IS THIS A 90 DAY SUPPLY : No  IS PATIENT OUT OF MEDICATION: Yes, both  IF NOT; HOW MUCH IS LEFT:   LAST APPOINTMENT DATE: @11 /05/2018  NEXT APPOINTMENT DATE:@1 /06/2019  DO WE HAVE YOUR PERMISSION TO LEAVE A DETAILED MESSAGE: Yes  OTHER COMMENTS:  Patient specified she uses Accu-Check nano   **Let patient know to contact pharmacy at the end of the day to make sure medication is ready. **  ** Please notify patient to allow 48-72 hours to process**  **Encourage patient to contact the pharmacy for refills or they can request refills through [facility]*

## 2018-12-07 ENCOUNTER — Ambulatory Visit (INDEPENDENT_AMBULATORY_CARE_PROVIDER_SITE_OTHER): Payer: Medicare Other | Admitting: Endocrinology

## 2018-12-07 ENCOUNTER — Other Ambulatory Visit: Payer: Self-pay

## 2018-12-07 ENCOUNTER — Telehealth: Payer: Self-pay | Admitting: Endocrinology

## 2018-12-07 ENCOUNTER — Encounter: Payer: Self-pay | Admitting: Endocrinology

## 2018-12-07 VITALS — BP 118/68 | HR 65 | Ht 64.0 in | Wt 193.4 lb

## 2018-12-07 DIAGNOSIS — E119 Type 2 diabetes mellitus without complications: Secondary | ICD-10-CM | POA: Diagnosis not present

## 2018-12-07 LAB — POCT GLYCOSYLATED HEMOGLOBIN (HGB A1C): Hemoglobin A1C: 6.3 % — AB (ref 4.0–5.6)

## 2018-12-07 MED ORDER — ACARBOSE 25 MG PO TABS: 25.0000 mg | ORAL_TABLET | Freq: Three times a day (TID) | ORAL | 11 refills | Status: DC

## 2018-12-07 MED ORDER — REPAGLINIDE 1 MG PO TABS: 1.0000 mg | ORAL_TABLET | Freq: Three times a day (TID) | ORAL | 3 refills | Status: DC

## 2018-12-07 MED ORDER — METFORMIN HCL ER 500 MG PO TB24: 1000.0000 mg | ORAL_TABLET | Freq: Every day | ORAL | 3 refills | Status: DC

## 2018-12-07 NOTE — Progress Notes (Signed)
Subjective:    Patient ID: [name], female    DOB: [birth year], 78 y.o.   MRN: [id number]  HPI Pt returns for f/u of diabetes mellitus: DM type: 2 Dx'ed: 7209 Complications: none Therapy: 6 oral meds.  GDM: never DKA: never Severe hypoglycemia: never.   Pancreatitis: never. Other: she stopped bromocriptine, as she said it caused hypoglycemia; she did not tolerate invokana, due to dizziness; I advised her to learn about insulin, just in case, but she declines.  Interval history:  she brings a record of her cbg's which I have reviewed today.  cbg varies from 97-174. she says she takes meds as rx'ed.  Main symptom is nausea.  Past Medical History:  Diagnosis Date   Arthritis    Depression    Diabetes mellitus without complication ([place])    Hypertension     Past Surgical History:  Procedure Laterality Date   ABDOMINAL HYSTERECTOMY     BACK SURGERY  2006   GALLBLADDER SURGERY  2005   HERNIA REPAIR     KNEE SURGERY  2001/2005    Social History   Socioeconomic History   Marital status: Unknown    Spouse name: Not on file   Number of children: Not on file   Years of education: Not on file   Highest education level: Not on file  Occupational History   Not on file  Social Needs   Financial resource strain: Not on file   Food insecurity:    Worry: Not on file    Inability: Not on file   Transportation needs:    Medical: Not on file    Non-medical: Not on file  Tobacco Use   Smoking status: Former Smoker    Packs/day: 0.05    Years: 3.00    Pack years: 0.15    Types: Cigarettes    Last attempt to quit: 1980    Years since quitting: 40.0   Smokeless tobacco: Never Used  Substance and Sexual Activity   Alcohol use: Not on file   Drug use: Not on file   Sexual activity: Not on file  Lifestyle   Physical activity:    Days per week: Not on file    Minutes per session: Not on file   Stress: Not on file  Relationships   Social  connections:    Talks on phone: Not on file    Gets together: Not on file    Attends religious service: Not on file    Active member of club or organization: Not on file    Attends meetings of clubs or organizations: Not on file    Relationship status: Not on file   Intimate partner violence:    Fear of current or ex partner: Not on file    Emotionally abused: Not on file    Physically abused: Not on file    Forced sexual activity: Not on file  Other Topics Concern   Not on file  Social History Narrative   Not on file    Current Outpatient Medications on File Prior to Visit  Medication Sig Dispense Refill   ACCU-CHEK FASTCLIX LANCETS MISC Check blood sugar two times daily 204 each 2   acetaminophen (TYLENOL) 325 MG tablet Take 650 mg by mouth every 6 (six) hours as needed for pain.     ALPRAZolam (XANAX) 1 MG tablet Take 1 mg by mouth at bedtime as needed for sleep.     atenolol (TENORMIN) 25 MG tablet Take 1 tablet (  25 mg total) by mouth daily. 90 tablet 3   CRESTOR 5 MG tablet Take 5 mg by mouth daily.     empagliflozin (JARDIANCE) 25 MG TABS tablet Take 25 mg by mouth daily. 30 tablet 11   enalapril (VASOTEC) 10 MG tablet enalapril maleate 10 mg tablet     flurbiprofen (ANSAID) 100 MG tablet      glucose blood (ACCU-CHEK GUIDE) test strip 1 each by Other route daily. And lancets 1/day 100 each 3   glucose blood (ACCU-CHEK SMARTVIEW) test strip Use to test blood glucose  two times daily as directed 200 each 2   glucose blood (ACCU-CHEK SMARTVIEW) test strip Use to test blood glucose 2 times daily, as instructed. Dx code: 250.00 200 each 2   Lancets Misc. (ACCU-CHEK FASTCLIX LANCET) KIT 1 each by Does not apply route daily. 1 kit 0   methocarbamol (ROBAXIN) 500 MG tablet Take 500 mg by mouth 3 (three) times daily.     pioglitazone (ACTOS) 45 MG tablet Take 1 tablet (45 mg total) by mouth daily. 90 tablet 3   sitaGLIPtin (JANUVIA) 100 MG tablet Take 1 tablet (100  mg total) by mouth daily. 90 tablet 2   traMADol (ULTRAM) 50 MG tablet Take 50 mg by mouth every 6 (six) hours as needed for pain.     Vitamin D, Ergocalciferol, (DRISDOL) 50000 UNITS CAPS capsule Take 50,000 Units by mouth every 7 (seven) days.     No current facility-administered medications on file prior to visit.     Allergies  Allergen Reactions   Aspirin Other (See Comments)    Pt states her PCP told her not to take aspirin but she can not remember exactly why.   Codeine Nausea And Vomiting   Morphine And Related Nausea And Vomiting    Family History  Problem Relation Age of Onset   Arthritis Mother    Hyperlipidemia Mother    Heart disease Mother    Hypertension Mother    Diabetes Mother    Arthritis Father    Hyperlipidemia Father    Heart disease Father    Hypertension Father    Diabetes Father    Alcohol abuse Sister    Hypertension Sister    Cancer Sister     BP 118/68 (BP Location: Left Arm, Patient Position: Sitting, Cuff Size: Normal)    Pulse 65    Ht 5' 4"  (1.626 m)    Wt 193 lb 6.4 oz (87.7 kg)    SpO2 97%    BMI 33.20 kg/m    Review of Systems She denies hypoglycemia    Objective:   Physical Exam VITAL SIGNS:  See vs page GENERAL: no distress Pulses: foot pulses are intact bilaterally.   MSK: no deformity of the feet or ankles.  CV: no edema of the legs, but there are bilat vv's.   Skin:  no ulcer on the feet or ankles.  normal color and temp on the feet and ankles.   Neuro: sensation is intact to touch on the feet and ankles.   Ext: There is bilateral onychomycosis of the toenails.     Lab Results  Component Value Date   HGBA1C 6.3 (A) 12/07/2018       Assessment & Plan:  Type 2 DM: slightly overcontrolled, at this pt's age. Nausea: new, prob due to metformin.  Patient Instructions  I have sent prescriptions to your pharmacy, to reduce the repaglinide.  You can use up your current pills, by cutting  them in half.     Please reduce the metformin to 2 pills per day.  Please continue the same other diabetes medications.   Please come back for a follow-up appointment in 3 months.  check your blood sugar once a day.  vary the time of day when you check, between before the 3 meals, and at bedtime.  also check if you have symptoms of your blood sugar being too high or too low.  please keep a record of the readings and bring it to your next appointment here (or you can bring the meter itself).  You can write it on any piece of paper.  please call us sooner if your blood sugar goes below 70, or if you have a lot of readings over 200.

## 2018-12-07 NOTE — Telephone Encounter (Signed)
OK, please refill PRN

## 2018-12-07 NOTE — Telephone Encounter (Signed)
Please advise if refill is appropriate

## 2018-12-07 NOTE — Telephone Encounter (Signed)
°  Patient stated the following [facility] [address]  [facility]    metFORMIN (GLUCOPHAGE-XR) 500 MG 24 hr tablet   repaglinide (PRANDIN) 1 MG tablet

## 2018-12-07 NOTE — Patient Instructions (Addendum)
I have sent prescriptions to your pharmacy, to reduce the repaglinide.  You can use up your current pills, by cutting them in half.    Please reduce the metformin to 2 pills per day.  Please continue the same other diabetes medications.   Please come back for a follow-up appointment in 3 months.  check your blood sugar once a day.  vary the time of day when you check, between before the 3 meals, and at bedtime.  also check if you have symptoms of your blood sugar being too high or too low.  please keep a record of the readings and bring it to your next appointment here (or you can bring the meter itself).  You can write it on any piece of paper.  please call us sooner if your blood sugar goes below 70, or if you have a lot of readings over 200.

## 2018-12-07 NOTE — Telephone Encounter (Signed)
acarbose (PRECOSE) 25 MG tablet  Patient is also needing the [facility]

## 2018-12-07 NOTE — Telephone Encounter (Signed)
Requests authorized and refills sent as requested.

## 2018-12-08 ENCOUNTER — Telehealth: Payer: Self-pay | Admitting: Endocrinology

## 2018-12-08 NOTE — Telephone Encounter (Signed)
Please Advise with patient if Dr. Loanne Drilling changed MG on these medications: acarbose (PRECOSE) 25 MG tablet repaglinide (PRANDIN) 1 MG tablet  Thanks

## 2018-12-08 NOTE — Telephone Encounter (Signed)
Please refill both PRN

## 2018-12-08 NOTE — Telephone Encounter (Signed)
Yes, med list is correct

## 2018-12-08 NOTE — Telephone Encounter (Signed)
Please advise

## 2018-12-08 NOTE — Telephone Encounter (Signed)
Pt is inquiring about the dosage. Is the dosage correct?

## 2018-12-09 NOTE — Telephone Encounter (Signed)
Called pt and informed of Dr. Cordelia Pen advice. Verbalized acceptance and understanding.

## 2019-01-05 ENCOUNTER — Other Ambulatory Visit: Payer: Self-pay

## 2019-01-05 DIAGNOSIS — E119 Type 2 diabetes mellitus without complications: Secondary | ICD-10-CM

## 2019-01-05 MED ORDER — ACARBOSE 25 MG PO TABS: 25.0000 mg | ORAL_TABLET | Freq: Three times a day (TID) | ORAL | 11 refills | Status: DC

## 2019-03-16 ENCOUNTER — Ambulatory Visit (INDEPENDENT_AMBULATORY_CARE_PROVIDER_SITE_OTHER): Payer: Medicare Other | Admitting: Endocrinology

## 2019-03-16 ENCOUNTER — Encounter: Payer: Self-pay | Admitting: Endocrinology

## 2019-03-16 ENCOUNTER — Other Ambulatory Visit: Payer: Self-pay

## 2019-03-16 DIAGNOSIS — E119 Type 2 diabetes mellitus without complications: Secondary | ICD-10-CM

## 2019-03-16 DIAGNOSIS — R251 Tremor, unspecified: Secondary | ICD-10-CM

## 2019-03-16 MED ORDER — METFORMIN HCL ER 500 MG PO TB24: 1000.0000 mg | ORAL_TABLET | Freq: Every day | ORAL | 3 refills | Status: DC

## 2019-03-16 NOTE — Progress Notes (Addendum)
Subjective:    Patient ID: [name], female    DOB: June 13, 1941, 78 y.o.   MRN: [id number]  HPI telehealth visit today via telephone visit.  Total time=10 minutes Alternatives to telehealth are presented to this patient, and the patient agrees to the telehealth visit. Pt is advised of the cost of the visit, and agrees to this, also.   Patient is at home, and I am at the office.   Pt returns for f/u of diabetes mellitus: DM type: 2 Dx'ed: 4562 Complications: none Therapy: 6 oral meds.  GDM: never DKA: never Severe hypoglycemia: never.   Pancreatitis: never. Other: she stopped bromocriptine, as she said it caused hypoglycemia; she did not tolerate invokana, due to dizziness; I advised her to learn about insulin, just in case, but she declines.  Interval history:  she brings a record of her cbg's which I have reviewed today.  cbg varies from 84-160. she says she takes meds as rx'ed.  When cbg is below 100, she has tremor.  She takes metformin 3x500 mg qd.   Past Medical History:  Diagnosis Date   Arthritis    Depression    Diabetes mellitus without complication ([place])    Hypertension     Past Surgical History:  Procedure Laterality Date   ABDOMINAL HYSTERECTOMY     BACK SURGERY  2006   GALLBLADDER SURGERY  2005   HERNIA REPAIR     KNEE SURGERY  2001/2005    Social History   Socioeconomic History   Marital status: Unknown    Spouse name: Not on file   Number of children: Not on file   Years of education: Not on file   Highest education level: Not on file  Occupational History   Not on file  Social Needs   Financial resource strain: Not on file   Food insecurity:    Worry: Not on file    Inability: Not on file   Transportation needs:    Medical: Not on file    Non-medical: Not on file  Tobacco Use   Smoking status: Former Smoker    Packs/day: 0.05    Years: 3.00    Pack years: 0.15    Types: Cigarettes    Last attempt to quit: 1980     Years since quitting: 40.3   Smokeless tobacco: Never Used  Substance and Sexual Activity   Alcohol use: Not on file   Drug use: Not on file   Sexual activity: Not on file  Lifestyle   Physical activity:    Days per week: Not on file    Minutes per session: Not on file   Stress: Not on file  Relationships   Social connections:    Talks on phone: Not on file    Gets together: Not on file    Attends religious service: Not on file    Active member of club or organization: Not on file    Attends meetings of clubs or organizations: Not on file    Relationship status: Not on file   Intimate partner violence:    Fear of current or ex partner: Not on file    Emotionally abused: Not on file    Physically abused: Not on file    Forced sexual activity: Not on file  Other Topics Concern   Not on file  Social History Narrative   Not on file    Current Outpatient Medications on File Prior to Visit  Medication Sig Dispense Refill  acarbose (PRECOSE) 25 MG tablet Take 1 tablet (25 mg total) by mouth 3 (three) times daily with meals. 90 tablet 11   acetaminophen (TYLENOL) 325 MG tablet Take 650 mg by mouth every 6 (six) hours as needed for pain.     ALPRAZolam (XANAX) 1 MG tablet Take 1 mg by mouth at bedtime as needed for sleep.     atenolol (TENORMIN) 25 MG tablet Take 1 tablet (25 mg total) by mouth daily. 90 tablet 3   CRESTOR 5 MG tablet Take 5 mg by mouth daily.     empagliflozin (JARDIANCE) 25 MG TABS tablet Take 25 mg by mouth daily. 30 tablet 11   enalapril (VASOTEC) 10 MG tablet enalapril maleate 10 mg tablet     flurbiprofen (ANSAID) 100 MG tablet      glucose blood (ACCU-CHEK GUIDE) test strip 1 each by Other route daily. And lancets 1/day 100 each 3   Lancets Misc. (ACCU-CHEK FASTCLIX LANCET) KIT 1 each by Does not apply route daily. 1 kit 0   methocarbamol (ROBAXIN) 500 MG tablet Take 500 mg by mouth 3 (three) times daily.     pioglitazone (ACTOS) 45 MG  tablet Take 1 tablet (45 mg total) by mouth daily. 90 tablet 3   repaglinide (PRANDIN) 1 MG tablet Take 1 tablet (1 mg total) by mouth 3 (three) times daily before meals. 270 tablet 3   traMADol (ULTRAM) 50 MG tablet Take 50 mg by mouth every 6 (six) hours as needed for pain.     Vitamin D, Ergocalciferol, (DRISDOL) 50000 UNITS CAPS capsule Take 50,000 Units by mouth every 7 (seven) days.     No current facility-administered medications on file prior to visit.     Allergies  Allergen Reactions   Aspirin Other (See Comments)    Pt states her PCP told her not to take aspirin but she can not remember exactly why.   Codeine Nausea And Vomiting   Morphine And Related Nausea And Vomiting    Family History  Problem Relation Age of Onset   Arthritis Mother    Hyperlipidemia Mother    Heart disease Mother    Hypertension Mother    Diabetes Mother    Arthritis Father    Hyperlipidemia Father    Heart disease Father    Hypertension Father    Diabetes Father    Alcohol abuse Sister    Hypertension Sister    Cancer Sister      Review of Systems She denies hypoglycemia.      Objective:   Physical Exam     Assessment & Plan:  Type 2 DM: well-controlled.  She declines a1c. Tremor.  pt says due to hypoglycemia.  Patient Instructions  Please reduce the metformin to 2 pills per day.  Please continue the same other diabetes medications.   Please come back for a follow-up appointment in 6 weeks.  check your blood sugar once a day.  vary the time of day when you check, between before the 3 meals, and at bedtime.  also check if you have symptoms of your blood sugar being too high or too low.  please keep a record of the readings and bring it to your next appointment here (or you can bring the meter itself).  You can write it on any piece of paper.  please call us sooner if your blood sugar goes below 70, or if you have a lot of readings over 200.

## 2019-03-16 NOTE — Patient Instructions (Addendum)
Please reduce the metformin to 2 pills per day.  Please continue the same other diabetes medications.   Please come back for a follow-up appointment in 6 weeks.  check your blood sugar once a day.  vary the time of day when you check, between before the 3 meals, and at bedtime.  also check if you have symptoms of your blood sugar being too high or too low.  please keep a record of the readings and bring it to your next appointment here (or you can bring the meter itself).  You can write it on any piece of paper.  please call us sooner if your blood sugar goes below 70, or if you have a lot of readings over 200.

## 2019-04-28 ENCOUNTER — Encounter: Payer: Self-pay | Admitting: Endocrinology

## 2019-04-29 ENCOUNTER — Ambulatory Visit: Payer: Medicare Other | Admitting: Endocrinology

## 2019-04-29 ENCOUNTER — Ambulatory Visit (INDEPENDENT_AMBULATORY_CARE_PROVIDER_SITE_OTHER): Payer: Medicare Other | Admitting: Endocrinology

## 2019-04-29 ENCOUNTER — Other Ambulatory Visit: Payer: Self-pay

## 2019-04-29 DIAGNOSIS — E119 Type 2 diabetes mellitus without complications: Secondary | ICD-10-CM | POA: Diagnosis not present

## 2019-04-29 NOTE — Patient Instructions (Addendum)
Please continue the same diabetes medications.   Please come back for a follow-up appointment in 2 months.  check your blood sugar once a day.  vary the time of day when you check, between before the 3 meals, and at bedtime.  also check if you have symptoms of your blood sugar being too high or too low.  please keep a record of the readings and bring it to your next appointment here (or you can bring the meter itself).  You can write it on any piece of paper.  please call us sooner if your blood sugar goes below 70, or if you have a lot of readings over 200.

## 2019-04-29 NOTE — Progress Notes (Signed)
Subjective:    Patient ID: [name], female    DOB: February 26, 1941, 78 y.o.   MRN: [id number]  HPI telehealth visit today via phone x 10 minutes Alternatives to telehealth are presented to this patient, and the patient agrees to the telehealth visit. Pt is advised of the cost of the visit, and agrees to this, also.   Patient is at home, and I am at the office.   Persons attending the telehealth visit: the patient and I.   Pt returns for f/u of diabetes mellitus: DM type: 2 Dx'ed: 0932 Complications: none Therapy: 5 oral meds.  GDM: never DKA: never Severe hypoglycemia: never.   Pancreatitis: never. Other: she stopped bromocriptine, as she said it caused hypoglycemia; she did not tolerate invokana, due to dizziness; I advised her to learn about insulin, just in case, but she declines.  Interval history: pt says cbg varies from 125-160. she says she takes meds as rx'ed. She had a steroid injection into the neck 3 weeks ago.   Past Medical History:  Diagnosis Date   Arthritis    Depression    Diabetes mellitus without complication ([place])    Hypertension     Past Surgical History:  Procedure Laterality Date   ABDOMINAL HYSTERECTOMY     BACK SURGERY  2006   GALLBLADDER SURGERY  2005   HERNIA REPAIR     KNEE SURGERY  2001/2005    Social History   Socioeconomic History   Marital status: Unknown    Spouse name: Not on file   Number of children: Not on file   Years of education: Not on file   Highest education level: Not on file  Occupational History   Not on file  Social Needs   Financial resource strain: Not on file   Food insecurity:    Worry: Not on file    Inability: Not on file   Transportation needs:    Medical: Not on file    Non-medical: Not on file  Tobacco Use   Smoking status: Former Smoker    Packs/day: 0.05    Years: 3.00    Pack years: 0.15    Types: Cigarettes    Last attempt to quit: 1980    Years since quitting: 40.4    Smokeless tobacco: Never Used  Substance and Sexual Activity   Alcohol use: Not on file   Drug use: Not on file   Sexual activity: Not on file  Lifestyle   Physical activity:    Days per week: Not on file    Minutes per session: Not on file   Stress: Not on file  Relationships   Social connections:    Talks on phone: Not on file    Gets together: Not on file    Attends religious service: Not on file    Active member of club or organization: Not on file    Attends meetings of clubs or organizations: Not on file    Relationship status: Not on file   Intimate partner violence:    Fear of current or ex partner: Not on file    Emotionally abused: Not on file    Physically abused: Not on file    Forced sexual activity: Not on file  Other Topics Concern   Not on file  Social History Narrative   Not on file    Current Outpatient Medications on File Prior to Visit  Medication Sig Dispense Refill   acarbose (PRECOSE) 25 MG tablet Take 1  tablet (25 mg total) by mouth 3 (three) times daily with meals. 90 tablet 11   acetaminophen (TYLENOL) 325 MG tablet Take 650 mg by mouth every 6 (six) hours as needed for pain.     ALPRAZolam (XANAX) 1 MG tablet Take 1 mg by mouth at bedtime as needed for sleep.     atenolol (TENORMIN) 25 MG tablet Take 1 tablet (25 mg total) by mouth daily. 90 tablet 3   CRESTOR 5 MG tablet Take 5 mg by mouth daily.     empagliflozin (JARDIANCE) 25 MG TABS tablet Take 25 mg by mouth daily. 30 tablet 11   enalapril (VASOTEC) 10 MG tablet enalapril maleate 10 mg tablet     flurbiprofen (ANSAID) 100 MG tablet      glucose blood (ACCU-CHEK GUIDE) test strip 1 each by Other route daily. And lancets 1/day 100 each 3   Lancets Misc. (ACCU-CHEK FASTCLIX LANCET) KIT 1 each by Does not apply route daily. 1 kit 0   metFORMIN (GLUCOPHAGE-XR) 500 MG 24 hr tablet Take 2 tablets (1,000 mg total) by mouth daily. 180 tablet 3   methocarbamol (ROBAXIN) 500 MG  tablet Take 500 mg by mouth 3 (three) times daily.     pioglitazone (ACTOS) 45 MG tablet Take 1 tablet (45 mg total) by mouth daily. 90 tablet 3   repaglinide (PRANDIN) 1 MG tablet Take 1 tablet (1 mg total) by mouth 3 (three) times daily before meals. 270 tablet 3   traMADol (ULTRAM) 50 MG tablet Take 50 mg by mouth every 6 (six) hours as needed for pain.     Vitamin D, Ergocalciferol, (DRISDOL) 50000 UNITS CAPS capsule Take 50,000 Units by mouth every 7 (seven) days.     No current facility-administered medications on file prior to visit.     Allergies  Allergen Reactions   Aspirin Other (See Comments)    Pt states her PCP told her not to take aspirin but she can not remember exactly why.   Codeine Nausea And Vomiting   Morphine And Related Nausea And Vomiting    Family History  Problem Relation Age of Onset   Arthritis Mother    Hyperlipidemia Mother    Heart disease Mother    Hypertension Mother    Diabetes Mother    Arthritis Father    Hyperlipidemia Father    Heart disease Father    Hypertension Father    Diabetes Father    Alcohol abuse Sister    Hypertension Sister    Cancer Sister      Review of Systems She denies hypoglycemia.      Objective:   Physical Exam      Assessment & Plan:  Type 2 DM: apparently well-controlled Neck pain, new to me: steroid injection could affect a1c, so we'll hold off for now.   Patient Instructions  Please continue the same diabetes medications.   Please come back for a follow-up appointment in 2 months.  check your blood sugar once a day.  vary the time of day when you check, between before the 3 meals, and at bedtime.  also check if you have symptoms of your blood sugar being too high or too low.  please keep a record of the readings and bring it to your next appointment here (or you can bring the meter itself).  You can write it on any piece of paper.  please call us sooner if your blood sugar goes below  70, or if you have a  lot of readings over 200.

## 2019-05-09 ENCOUNTER — Other Ambulatory Visit: Payer: Self-pay | Admitting: Endocrinology

## 2019-05-09 DIAGNOSIS — E119 Type 2 diabetes mellitus without complications: Secondary | ICD-10-CM

## 2019-07-07 ENCOUNTER — Other Ambulatory Visit: Payer: Self-pay

## 2019-07-07 ENCOUNTER — Encounter (HOSPITAL_BASED_OUTPATIENT_CLINIC_OR_DEPARTMENT_OTHER): Payer: Self-pay | Admitting: *Deleted

## 2019-07-07 NOTE — Progress Notes (Addendum)
Spoke with patient via telephone for pre op interview. NPO after MN. Patient to take Atenolol AM of surgery with a sip of water. Arrival time 1130. Will need ISTAT 8 AM of surgery.

## 2019-07-07 NOTE — H&P (Signed)
Patient's anticipated LOS is less than 2 midnights, meeting these requirements: - Younger than 42 - Lives within 1 hour of care - Has a competent adult at home to recover with post-op recover - NO history of  - Chronic pain requiring opiods  - Diabetes  - Coronary Artery Disease  - Heart failure  - Heart attack  - Stroke  - DVT/VTE  - Cardiac arrhythmia  - Respiratory Failure/COPD  - Renal failure  - Anemia  - Advanced Liver disease       [name] is an 78 y.o. female.    Chief Complaint: left foot pain  HPI: Pt is a 79 y.o. female complaining of left foot pain for multiple years. Pain had continually increased since the beginning. Pt has tried various conservative treatments which have failed to alleviate their symptoms. Various options are discussed with the patient. Risks, benefits and expectations were discussed with the patient. Patient understand the risks, benefits and expectations and wishes to proceed with surgery.   PCP:  [name], MD  D/C Plans: Home  PMH: Past Medical History:  Diagnosis Date   Arthritis    Depression    Diabetes mellitus without complication ([place])    Hypertension     PSH: Past Surgical History:  Procedure Laterality Date   ABDOMINAL HYSTERECTOMY     BACK SURGERY  2006   GALLBLADDER SURGERY  2005   HERNIA REPAIR     KNEE SURGERY  2001/2005    Social History:  reports that she quit smoking about 40 years ago. Her smoking use included cigarettes. She has a 0.15 pack-year smoking history. She has never used smokeless tobacco. No history on file for alcohol and drug.  Allergies:  Allergies  Allergen Reactions   Aspirin Other (See Comments)    Pt states her PCP told her not to take aspirin but she can not remember exactly why.   Codeine Nausea And Vomiting   Morphine And Related Nausea And Vomiting    Medications: No current facility-administered medications for this encounter.    Current Outpatient  Medications  Medication Sig Dispense Refill   acarbose (PRECOSE) 25 MG tablet Take 1 tablet (25 mg total) by mouth 3 (three) times daily with meals. 90 tablet 11   acetaminophen (TYLENOL) 325 MG tablet Take 650 mg by mouth every 6 (six) hours as needed for pain.     ALPRAZolam (XANAX) 1 MG tablet Take 1 mg by mouth at bedtime as needed for sleep.     atenolol (TENORMIN) 25 MG tablet Take 1 tablet (25 mg total) by mouth daily. 90 tablet 3   CRESTOR 5 MG tablet Take 5 mg by mouth daily.     empagliflozin (JARDIANCE) 25 MG TABS tablet Take 25 mg by mouth daily. 30 tablet 11   enalapril (VASOTEC) 10 MG tablet enalapril maleate 10 mg tablet     flurbiprofen (ANSAID) 100 MG tablet      glucose blood (ACCU-CHEK GUIDE) test strip 1 each by Other route daily. And lancets 1/day 100 each 3   Lancets Misc. (ACCU-CHEK FASTCLIX LANCET) KIT 1 each by Does not apply route daily. 1 kit 0   metFORMIN (GLUCOPHAGE-XR) 500 MG 24 hr tablet TAKE 2 TABLETS BY MOUTH TWICE A DAY 360 tablet 2   methocarbamol (ROBAXIN) 500 MG tablet Take 500 mg by mouth 3 (three) times daily.     pioglitazone (ACTOS) 45 MG tablet Take 1 tablet (45 mg total) by mouth daily. 90 tablet 3  repaglinide (PRANDIN) 1 MG tablet Take 1 tablet (1 mg total) by mouth 3 (three) times daily before meals. 270 tablet 3   traMADol (ULTRAM) 50 MG tablet Take 50 mg by mouth every 6 (six) hours as needed for pain.     Vitamin D, Ergocalciferol, (DRISDOL) 50000 UNITS CAPS capsule Take 50,000 Units by mouth every 7 (seven) days.      No results found for this or any previous visit (from the past 48 hour(s)). No results found.  ROS: Pain with rom of the left lower extremity  Physical Exam: Alert and oriented 78 y.o. female in no acute distress Cranial nerves 2-12 intact Cervical spine: full rom with no tenderness, nv intact distally Chest: active breath sounds bilaterally, no wheeze rhonchi or rales Heart: regular rate and rhythm, no  murmur Abd: non tender non distended with active bowel sounds Hip is stable with rom  Left foot painful mass nv intact distally  Assessment/Plan Assessment: left foot painful mass  Plan:  Patient will undergo a left foot mass excision by Dr. Veverly Fells at [facility]. Risks benefits and expectations were discussed with the patient. Patient understand risks, benefits and expectations and wishes to proceed. Preoperative templating of the joint replacement has been completed, documented, and submitted to the Operating Room personnel in order to optimize intra-operative equipment management.   Merla Riches PA-C, MPAS [facility] is now [employer] [address] [place], [place], [place] [address] Phone: [phone] [email or web address]   [employer]

## 2019-07-08 ENCOUNTER — Other Ambulatory Visit (HOSPITAL_COMMUNITY)
Admission: RE | Admit: 2019-07-08 | Discharge: 2019-07-08 | Disposition: A | Discharge status: Home / Self Care | Payer: Medicare Other | Source: Ambulatory Visit | Attending: Orthopedic Surgery | Admitting: Orthopedic Surgery

## 2019-07-08 DIAGNOSIS — Z01812 Encounter for preprocedural laboratory examination: Secondary | ICD-10-CM | POA: Insufficient documentation

## 2019-07-08 DIAGNOSIS — Z20828 Contact with and (suspected) exposure to other viral communicable diseases: Secondary | ICD-10-CM | POA: Insufficient documentation

## 2019-07-08 LAB — SARS CORONAVIRUS 2 (TAT 6-24 HRS): SARS Coronavirus 2: NEGATIVE

## 2019-07-11 ENCOUNTER — Encounter (HOSPITAL_BASED_OUTPATIENT_CLINIC_OR_DEPARTMENT_OTHER)
Admission: RE | Disposition: A | Discharge status: Home / Self Care | Payer: Self-pay | Source: Home / Self Care | Attending: Orthopedic Surgery

## 2019-07-11 ENCOUNTER — Encounter (HOSPITAL_BASED_OUTPATIENT_CLINIC_OR_DEPARTMENT_OTHER): Payer: Self-pay | Admitting: *Deleted

## 2019-07-11 ENCOUNTER — Ambulatory Visit (HOSPITAL_BASED_OUTPATIENT_CLINIC_OR_DEPARTMENT_OTHER): Payer: Medicare Other | Admitting: Anesthesiology

## 2019-07-11 ENCOUNTER — Ambulatory Visit (HOSPITAL_BASED_OUTPATIENT_CLINIC_OR_DEPARTMENT_OTHER)
Admission: RE | Admit: 2019-07-11 | Discharge: 2019-07-11 | Disposition: A | Discharge status: Home / Self Care | Payer: Medicare Other | Attending: Orthopedic Surgery | Admitting: Orthopedic Surgery

## 2019-07-11 ENCOUNTER — Other Ambulatory Visit: Payer: Self-pay

## 2019-07-11 DIAGNOSIS — Z885 Allergy status to narcotic agent status: Secondary | ICD-10-CM | POA: Insufficient documentation

## 2019-07-11 DIAGNOSIS — M79672 Pain in left foot: Secondary | ICD-10-CM | POA: Diagnosis present

## 2019-07-11 DIAGNOSIS — D1724 Benign lipomatous neoplasm of skin and subcutaneous tissue of left leg: Secondary | ICD-10-CM | POA: Diagnosis not present

## 2019-07-11 DIAGNOSIS — Z87891 Personal history of nicotine dependence: Secondary | ICD-10-CM | POA: Insufficient documentation

## 2019-07-11 DIAGNOSIS — I10 Essential (primary) hypertension: Secondary | ICD-10-CM | POA: Diagnosis not present

## 2019-07-11 DIAGNOSIS — R2242 Localized swelling, mass and lump, left lower limb: Secondary | ICD-10-CM | POA: Diagnosis present

## 2019-07-11 DIAGNOSIS — M1990 Unspecified osteoarthritis, unspecified site: Secondary | ICD-10-CM | POA: Diagnosis not present

## 2019-07-11 DIAGNOSIS — Z6839 Body mass index (BMI) 39.0-39.9, adult: Secondary | ICD-10-CM | POA: Diagnosis not present

## 2019-07-11 DIAGNOSIS — E119 Type 2 diabetes mellitus without complications: Secondary | ICD-10-CM | POA: Insufficient documentation

## 2019-07-11 DIAGNOSIS — Z79899 Other long term (current) drug therapy: Secondary | ICD-10-CM | POA: Diagnosis not present

## 2019-07-11 DIAGNOSIS — Z886 Allergy status to analgesic agent status: Secondary | ICD-10-CM | POA: Diagnosis not present

## 2019-07-11 DIAGNOSIS — Z7984 Long term (current) use of oral hypoglycemic drugs: Secondary | ICD-10-CM | POA: Diagnosis not present

## 2019-07-11 HISTORY — PX: EXCISION MASS LOWER EXTREMETIES: SHX6705

## 2019-07-11 LAB — POCT I-STAT, CHEM 8
BUN: 14 mg/dL (ref 8–23)
Calcium, Ion: 1.28 mmol/L (ref 1.15–1.40)
Chloride: 105 mmol/L (ref 98–111)
Creatinine, Ser: 0.6 mg/dL (ref 0.44–1.00)
Glucose, Bld: 189 mg/dL — ABNORMAL HIGH (ref 70–99)
HCT: 39 % (ref 36.0–46.0)
Hemoglobin: 13.3 g/dL (ref 12.0–15.0)
Potassium: 4.1 mmol/L (ref 3.5–5.1)
Sodium: 141 mmol/L (ref 135–145)
TCO2: 27 mmol/L (ref 22–32)

## 2019-07-11 LAB — GLUCOSE, CAPILLARY: Glucose-Capillary: 170 mg/dL — ABNORMAL HIGH (ref 70–99)

## 2019-07-11 SURGERY — EXCISION MASS LOWER EXTREMITIES
Anesthesia: General | Site: Foot | Laterality: Left

## 2019-07-11 MED ORDER — FENTANYL CITRATE (PF) 100 MCG/2ML IJ SOLN
INTRAMUSCULAR | Status: AC
  Filled 2019-07-11: qty 2

## 2019-07-11 MED ORDER — FENTANYL CITRATE (PF) 100 MCG/2ML IJ SOLN
25.0000 ug | INTRAMUSCULAR | Status: DC | PRN
  Administered 2019-07-11 (×2): 25 ug via INTRAVENOUS
  Filled 2019-07-11: qty 1

## 2019-07-11 MED ORDER — DEXAMETHASONE SODIUM PHOSPHATE 10 MG/ML IJ SOLN
INTRAMUSCULAR | Status: AC
  Filled 2019-07-11: qty 1

## 2019-07-11 MED ORDER — ONDANSETRON HCL 4 MG/2ML IJ SOLN
4.0000 mg | Freq: Once | INTRAMUSCULAR | Status: AC
  Administered 2019-07-11: 4 mg via INTRAVENOUS
  Filled 2019-07-11: qty 2

## 2019-07-11 MED ORDER — PROMETHAZINE HCL 25 MG/ML IJ SOLN
6.2500 mg | INTRAMUSCULAR | Status: DC | PRN
  Filled 2019-07-11: qty 1

## 2019-07-11 MED ORDER — ONDANSETRON HCL 4 MG/2ML IJ SOLN
INTRAMUSCULAR | Status: AC
  Filled 2019-07-11: qty 2

## 2019-07-11 MED ORDER — PHENYLEPHRINE 40 MCG/ML (10ML) SYRINGE FOR IV PUSH (FOR BLOOD PRESSURE SUPPORT)
PREFILLED_SYRINGE | INTRAVENOUS | Status: AC
  Filled 2019-07-11: qty 10

## 2019-07-11 MED ORDER — OXYCODONE-ACETAMINOPHEN 5-325 MG PO TABS: 1.0000 | ORAL_TABLET | ORAL | 0 refills | Status: AC | PRN

## 2019-07-11 MED ORDER — PHENYLEPHRINE 40 MCG/ML (10ML) SYRINGE FOR IV PUSH (FOR BLOOD PRESSURE SUPPORT)
PREFILLED_SYRINGE | INTRAVENOUS | Status: DC | PRN
  Administered 2019-07-11: 40 ug via INTRAVENOUS
  Administered 2019-07-11: 80 ug via INTRAVENOUS
  Administered 2019-07-11: 120 ug via INTRAVENOUS

## 2019-07-11 MED ORDER — EPHEDRINE 5 MG/ML INJ
INTRAVENOUS | Status: AC
  Filled 2019-07-11: qty 10

## 2019-07-11 MED ORDER — BUPIVACAINE HCL (PF) 0.25 % IJ SOLN
INTRAMUSCULAR | Status: DC | PRN
  Administered 2019-07-11: 9 mL

## 2019-07-11 MED ORDER — DEXAMETHASONE SODIUM PHOSPHATE 4 MG/ML IJ SOLN
INTRAMUSCULAR | Status: DC | PRN
  Administered 2019-07-11: 4 mg via INTRAVENOUS

## 2019-07-11 MED ORDER — FENTANYL CITRATE (PF) 100 MCG/2ML IJ SOLN
INTRAMUSCULAR | Status: DC | PRN
  Administered 2019-07-11: 25 ug via INTRAVENOUS
  Administered 2019-07-11: 50 ug via INTRAVENOUS
  Administered 2019-07-11: 25 ug via INTRAVENOUS

## 2019-07-11 MED ORDER — PROPOFOL 10 MG/ML IV BOLUS
INTRAVENOUS | Status: AC
  Filled 2019-07-11: qty 20

## 2019-07-11 MED ORDER — CEFAZOLIN SODIUM-DEXTROSE 2-4 GM/100ML-% IV SOLN
INTRAVENOUS | Status: AC
  Filled 2019-07-11: qty 100

## 2019-07-11 MED ORDER — LIDOCAINE 2% (20 MG/ML) 5 ML SYRINGE
INTRAMUSCULAR | Status: AC
  Filled 2019-07-11: qty 5

## 2019-07-11 MED ORDER — LACTATED RINGERS IV SOLN
INTRAVENOUS | Status: DC
  Administered 2019-07-11: 13:00:00 via INTRAVENOUS
  Filled 2019-07-11: qty 1000

## 2019-07-11 MED ORDER — EPHEDRINE SULFATE-NACL 50-0.9 MG/10ML-% IV SOSY
PREFILLED_SYRINGE | INTRAVENOUS | Status: DC | PRN
  Administered 2019-07-11: 10 mg via INTRAVENOUS
  Administered 2019-07-11: 15 mg via INTRAVENOUS

## 2019-07-11 MED ORDER — PROPOFOL 10 MG/ML IV BOLUS
INTRAVENOUS | Status: DC | PRN
  Administered 2019-07-11: 150 mg via INTRAVENOUS
  Administered 2019-07-11: 20 mg via INTRAVENOUS

## 2019-07-11 MED ORDER — OXYCODONE HCL 5 MG PO TABS
ORAL_TABLET | ORAL | Status: AC
  Filled 2019-07-11: qty 1

## 2019-07-11 MED ORDER — CHLORHEXIDINE GLUCONATE 4 % EX LIQD
60.0000 mL | Freq: Once | CUTANEOUS | Status: DC
  Filled 2019-07-11: qty 118

## 2019-07-11 MED ORDER — LIDOCAINE 2% (20 MG/ML) 5 ML SYRINGE
INTRAMUSCULAR | Status: DC | PRN
  Administered 2019-07-11: 60 mg via INTRAVENOUS

## 2019-07-11 MED ORDER — ONDANSETRON HCL 4 MG/2ML IJ SOLN
INTRAMUSCULAR | Status: DC | PRN
  Administered 2019-07-11: 4 mg via INTRAVENOUS

## 2019-07-11 MED ORDER — OXYCODONE HCL 5 MG PO TABS
5.0000 mg | ORAL_TABLET | Freq: Once | ORAL | Status: AC
  Administered 2019-07-11: 5 mg via ORAL
  Filled 2019-07-11: qty 1

## 2019-07-11 MED ORDER — CEFAZOLIN SODIUM-DEXTROSE 2-4 GM/100ML-% IV SOLN
2.0000 g | INTRAVENOUS | Status: AC
  Administered 2019-07-11: 2 g via INTRAVENOUS
  Filled 2019-07-11: qty 100

## 2019-07-11 SURGICAL SUPPLY — 65 items
BANDAGE ELASTIC 6 VELCRO NS (GAUZE/BANDAGES/DRESSINGS) IMPLANT
BANDAGE ESMARK 6X9 LF (GAUZE/BANDAGES/DRESSINGS) IMPLANT
BLADE SURG 10 STRL SS (BLADE) IMPLANT
BLADE SURG 15 STRL LF DISP TIS (BLADE) IMPLANT
BLADE SURG 15 STRL SS (BLADE) ×2
BNDG CMPR 9X4 STRL LF SNTH (GAUZE/BANDAGES/DRESSINGS)
BNDG CMPR 9X6 STRL LF SNTH (GAUZE/BANDAGES/DRESSINGS)
BNDG COHESIVE 4X5 TAN STRL (GAUZE/BANDAGES/DRESSINGS) ×1 IMPLANT
BNDG ESMARK 4X9 LF (GAUZE/BANDAGES/DRESSINGS) IMPLANT
BNDG ESMARK 6X9 LF (GAUZE/BANDAGES/DRESSINGS)
BNDG GAUZE ELAST 4 BULKY (GAUZE/BANDAGES/DRESSINGS) ×1 IMPLANT
CANISTER SUCT 3000ML PPV (MISCELLANEOUS) ×1 IMPLANT
CLOTH BEACON ORANGE TIMEOUT ST (SAFETY) ×2 IMPLANT
COVER BACK TABLE 60X90IN (DRAPES) ×2 IMPLANT
COVER MAYO STAND STRL (DRAPES) ×2 IMPLANT
COVER WAND RF STERILE (DRAPES) ×2 IMPLANT
CUFF TOURN SGL QUICK 24 (TOURNIQUET CUFF) ×2
CUFF TRNQT CYL 24X4X16.5-23 (TOURNIQUET CUFF) IMPLANT
DRAIN PENROSE 18X1/2 LTX STRL (DRAIN) IMPLANT
DRAPE EXTREMITY T 121X128X90 (DISPOSABLE) ×2 IMPLANT
DRAPE ORTHO SPLIT 77X108 STRL (DRAPES)
DRAPE SURG ORHT 6 SPLT 77X108 (DRAPES) IMPLANT
DRAPE U-SHAPE 47X51 STRL (DRAPES) ×2 IMPLANT
DRSG EMULSION OIL 3X3 NADH (GAUZE/BANDAGES/DRESSINGS) ×1 IMPLANT
DURAPREP 26ML APPLICATOR (WOUND CARE) ×2 IMPLANT
ELECT REM PT RETURN 9FT ADLT (ELECTROSURGICAL) ×2
ELECTRODE REM PT RTRN 9FT ADLT (ELECTROSURGICAL) ×1 IMPLANT
GAUZE SPONGE 4X4 12PLY STRL (GAUZE/BANDAGES/DRESSINGS) ×1 IMPLANT
GLOVE SURG ORTHO 8.5 STRL (GLOVE) ×1 IMPLANT
GOWN W/COTTON TOWEL STD LRG (GOWNS) ×2 IMPLANT
GOWN XL W/COTTON TOWEL STD (GOWNS) ×2 IMPLANT
HANDPIECE INTERPULSE COAX TIP (DISPOSABLE)
KIT TURNOVER CYSTO (KITS) ×2 IMPLANT
MANIFOLD NEPTUNE II (INSTRUMENTS) IMPLANT
NDL HYPO 25X1 1.5 SAFETY (NEEDLE) IMPLANT
NEEDLE HYPO 22GX1.5 SAFETY (NEEDLE) IMPLANT
NEEDLE HYPO 25X1 1.5 SAFETY (NEEDLE) ×2 IMPLANT
PACK BASIN DAY SURGERY FS (CUSTOM PROCEDURE TRAY) ×2 IMPLANT
PAD ABD 8X10 STRL (GAUZE/BANDAGES/DRESSINGS) IMPLANT
PAD CAST 4YDX4 CTTN HI CHSV (CAST SUPPLIES) IMPLANT
PADDING CAST COTTON 4X4 STRL (CAST SUPPLIES)
PENCIL BUTTON HOLSTER BLD 10FT (ELECTRODE) ×2 IMPLANT
SET HNDPC FAN SPRY TIP SCT (DISPOSABLE) IMPLANT
STOCKINETTE 4X48 STRL (DRAPES) IMPLANT
STOCKINETTE 6  STRL (DRAPES)
STOCKINETTE 6 STRL (DRAPES) IMPLANT
STOCKINETTE IMPERVIOUS LG (DRAPES) IMPLANT
STRIP CLOSURE SKIN 1/2X4 (GAUZE/BANDAGES/DRESSINGS) ×1 IMPLANT
STRIP CLOSURE SKIN 1/4X4 (GAUZE/BANDAGES/DRESSINGS) IMPLANT
SUT ETHILON 3 0 PS 1 (SUTURE) IMPLANT
SUT ETHILON 4 0 PS 2 18 (SUTURE) IMPLANT
SUT MNCRL AB 4-0 PS2 18 (SUTURE) ×1 IMPLANT
SUT VIC AB 2-0 CT1 (SUTURE) ×1 IMPLANT
SUT VIC AB 2-0 CT1 27 (SUTURE)
SUT VIC AB 2-0 CT1 TAPERPNT 27 (SUTURE) IMPLANT
SUT VIC AB 3-0 SH 27 (SUTURE)
SUT VIC AB 3-0 SH 27X BRD (SUTURE) IMPLANT
SWAB COLLECTION DEVICE MRSA (MISCELLANEOUS) IMPLANT
SYR BULB IRRIGATION 50ML (SYRINGE) IMPLANT
SYR CONTROL 10ML LL (SYRINGE) ×2 IMPLANT
TOWEL OR 17X26 10 PK STRL BLUE (TOWEL DISPOSABLE) ×3 IMPLANT
TRAY DSU PREP LF (CUSTOM PROCEDURE TRAY) IMPLANT
TUBE ANAEROBIC PORT A CUL  W/M (MISCELLANEOUS) IMPLANT
TUBE CONNECTING 12X1/4 (SUCTIONS) IMPLANT
YANKAUER SUCT BULB TIP NO VENT (SUCTIONS) IMPLANT

## 2019-07-11 NOTE — Discharge Instructions (Signed)
Ok to bear weight on the left foot but be off your feet as much as possible.  Elevate the left foot above the hear in a recliner or on a sofa.  Keep the dressing clean and dry until follow up with Dr Veverly Fells  Call for appointment in two weeks, [phone]    Call your surgeon if you experience:   1.  Fever over 101.0. 2.  Inability to urinate. 3.  Nausea and/or vomiting. 4.  Extreme swelling or bruising at the surgical site. 5.  Continued bleeding from the incision. 6.  Increased pain, redness or drainage from the incision. 7.  Problems related to your pain medication. 8.  Any problems and/or concerns Post Anesthesia Home Care Instructions  Activity: Get plenty of rest for the remainder of the day. A responsible individual must stay with you for 24 hours following the procedure.  For the next 24 hours, DO NOT: -Drive a car -Paediatric nurse -Drink alcoholic beverages -Take any medication unless instructed by your physician -Make any legal decisions or sign important papers.  Meals: Start with liquid foods such as gelatin or soup. Progress to regular foods as tolerated. Avoid greasy, spicy, heavy foods. If nausea and/or vomiting occur, drink only clear liquids until the nausea and/or vomiting subsides. Call your physician if vomiting continues.  Special Instructions/Symptoms: Your throat may feel dry or sore from the anesthesia or the breathing tube placed in your throat during surgery. If this causes discomfort, gargle with warm salt water. The discomfort should disappear within 24 hours.  If you had a scopolamine patch placed behind your ear for the management of post- operative nausea and/or vomiting:  1. The medication in the patch is effective for 72 hours, after which it should be removed.  Wrap patch in a tissue and discard in the trash. Wash hands thoroughly with soap and water. 2. You may remove the patch earlier than 72 hours if you experience unpleasant side effects  which may include dry mouth, dizziness or visual disturbances. 3. Avoid touching the patch. Wash your hands with soap and water after contact with the patch.

## 2019-07-11 NOTE — Anesthesia Preprocedure Evaluation (Addendum)
Anesthesia Evaluation  Patient identified by MRN, date of birth, ID band Patient awake    Reviewed: Allergy & Precautions, NPO status , Patient's Chart, lab work & pertinent test results  Airway Mallampati: II  TM Distance: >3 FB Neck ROM: Full    Dental no notable dental hx.    Pulmonary neg pulmonary ROS, former smoker,    Pulmonary exam normal breath sounds clear to auscultation       Cardiovascular hypertension, Pt. on medications and Pt. on home beta blockers Normal cardiovascular exam Rhythm:Regular Rate:Normal     Neuro/Psych negative neurological ROS  negative psych ROS   GI/Hepatic negative GI ROS, Neg liver ROS,   Endo/Other  diabetesMorbid obesity  Renal/GU negative Renal ROS  negative genitourinary   Musculoskeletal negative musculoskeletal ROS (+)   Abdominal   Peds negative pediatric ROS (+)  Hematology negative hematology ROS (+)   Anesthesia Other Findings   Reproductive/Obstetrics negative OB ROS                            Anesthesia Physical Anesthesia Plan  ASA: III  Anesthesia Plan: General   Post-op Pain Management:    Induction: Intravenous  PONV Risk Score and Plan: 3 and Ondansetron, Treatment may vary due to age or medical condition and Dexamethasone  Airway Management Planned: LMA  Additional Equipment:   Intra-op Plan:   Post-operative Plan: Extubation in OR  Informed Consent: I have reviewed the patients History and Physical, chart, labs and discussed the procedure including the risks, benefits and alternatives for the proposed anesthesia with the patient or authorized representative who has indicated his/her understanding and acceptance.     Dental advisory given  Plan Discussed with: CRNA and Surgeon  Anesthesia Plan Comments:        Anesthesia Quick Evaluation

## 2019-07-11 NOTE — Brief Op Note (Signed)
07/11/2019  1:36 PM  PATIENT:  [name]  78 y.o. female  PRE-OPERATIVE DIAGNOSIS:  Left foot painful mass  POST-OPERATIVE DIAGNOSIS:  Left foot painful mass  PROCEDURE:  Procedure(s): EXCISION MASS foot (Left)  SURGEON:  Surgeon(s) and Role:    Netta Cedars, MD - Primary  PHYSICIAN ASSISTANT:   ASSISTANTS: none   ANESTHESIA:   general and local  EBL:  5 mL   BLOOD ADMINISTERED:none  DRAINS: none   LOCAL MEDICATIONS USED:  MARCAINE     SPECIMEN:  Source of Specimen:  left ankle mass  DISPOSITION OF SPECIMEN:  PATHOLOGY  COUNTS:  YES  TOURNIQUET:  * Missing tourniquet times found for documented tourniquets in log: [id number] *  DICTATION: .Other Dictation: Dictation Number [id number]  PLAN OF CARE: Discharge to home after PACU  PATIENT DISPOSITION:  PACU - hemodynamically stable.   Delay start of Pharmacological VTE agent (>24hrs) due to surgical blood loss or risk of bleeding: not applicable

## 2019-07-11 NOTE — Transfer of Care (Signed)
°  Last Vitals:  Vitals Value Taken Time  BP 138/64 07/11/19 1341  Temp    Pulse 80 07/11/19 1344  Resp 11 07/11/19 1344  SpO2 100 % 07/11/19 1344  Vitals shown include unvalidated device data.  Last Pain:  Vitals:   07/11/19 1202  TempSrc: Oral  PainSc: 8       Patients Stated Pain Goal: 7 (07/11/19 1202)  Immediate Anesthesia Transfer of Care Note  Patient: [name]  Procedure(s) Performed: Procedure(s) (LRB): EXCISION MASS foot (Left)  Patient Location: PACU  Anesthesia Type: General  Level of Consciousness: awake, alert  and oriented  Airway & Oxygen Therapy: Patient Spontanous Breathing and Patient connected to nasal cannula oxygen  Post-op Assessment: Report given to PACU RN and Post -op Vital signs reviewed and stable  Post vital signs: Reviewed and stable  Complications: No apparent anesthesia complications

## 2019-07-11 NOTE — Op Note (Signed)
NAMECIMONE, FAHEY MEDICAL RECORD [id number] ACCOUNT [id number] DATE OF BIRTH:[birth year] FACILITY: [facility] LOCATION: [facility] PHYSICIAN:STEVEN Orlena Sheldon, MD  OPERATIVE REPORT  DATE OF PROCEDURE:  07/11/2019  PREOPERATIVE DIAGNOSIS:  Left foot mass, painful.  POSTOPERATIVE DIAGNOSIS:  Left foot mass, painful.  PROCEDURE PERFORMED:  Excision of left foot mass.  ATTENDING SURGEON:  Esmond Plants, MD  ASSISTANT:  None.  ANESTHESIA:  General anesthesia was used.  ESTIMATED BLOOD LOSS:  Minimal.  FLUID REPLACEMENT:  500 mL crystalloid.  INSTRUMENT COUNTS:  Correct.  COMPLICATIONS:  There were no complications.  ANTIBIOTICS:  Perioperative antibiotics given.  INDICATIONS:  The patient is a 78 year old female with a painful left dorsal foot mass.  This has been identified as a lipoma.  The patient is complaining of pain with footwear and some enlargement in the mass.  Given the combination of factors, the  enlargement of the mass, and the pain associated with the mass, we discussed options including surgical excision as an outpatient procedure.  The patient elected to proceed with excision.  Risks and benefits of surgery were discussed and informed consent  was obtained.  DESCRIPTION OF PROCEDURE:  After an adequate level of anesthesia was achieved, the patient was positioned in the supine position.  A calf tourniquet was placed after the patient was put off to sleep.  Left leg was correctly identified and timeout called.   Sterile prep and drape of course was performed.  We performed a longitudinal incision overlying the subcutaneous mass present just anterior and distal to the distal fibula.  This mass appeared to be approximately 6 x 8 cm in size.  A skin incision with  a 10-blade scalpel.  Metzenbaum scissors for subcutaneous tissue dissection around the mass, which appeared to be a lipoma.  We removed it in its entirety.  The muscular fascia was intact with no attachment  points.  A couple of small bleeders noted,  likely feeder vessels, and those were ligated.  We sent the soft tissue mass to pathology in formalin for final specimen.  We irrigated thoroughly and closed in layered closure.  The patient's skin was extremely thin.  We used 2-0 Vicryl and 4-0  Monocryl.  Steri-Strips and a sterile compressive bandage were applied.  The patient was transported to recovery room in stable condition.  LN/NUANCE  D:07/11/2019 T:07/11/2019 JOB:[id number]

## 2019-07-11 NOTE — Anesthesia Procedure Notes (Signed)
Procedure Name: LMA Insertion Date/Time: 07/11/2019 1:00 PM Performed by: Myrtie Soman, MD Pre-anesthesia Checklist: Patient identified, Emergency Drugs available, Suction available and Patient being monitored Patient Re-evaluated:Patient Re-evaluated prior to induction Oxygen Delivery Method: Circle system utilized Preoxygenation: Pre-oxygenation with 100% oxygen Induction Type: IV induction Ventilation: Mask ventilation without difficulty LMA: LMA inserted LMA Size: 4.0 Number of attempts: 1 Airway Equipment and Method: Bite block Placement Confirmation: positive ETCO2 Tube secured with: Tape Dental Injury: Teeth and Oropharynx as per pre-operative assessment

## 2019-07-11 NOTE — Interval H&P Note (Signed)
History and Physical Interval Note:  07/11/2019 12:23 PM  [name]  has presented today for surgery, with the diagnosis of Left foot painful mass.  The various methods of treatment have been discussed with the patient and family. After consideration of risks, benefits and other options for treatment, the patient has consented to  Procedure(s): EXCISION MASS foot (Left) as a surgical intervention.  The patient's history has been reviewed, patient examined, no change in status, stable for surgery.  I have reviewed the patient's chart and labs.  Questions were answered to the patient's satisfaction.     Augustin Schooling

## 2019-07-12 ENCOUNTER — Encounter (HOSPITAL_BASED_OUTPATIENT_CLINIC_OR_DEPARTMENT_OTHER): Payer: Self-pay | Admitting: Orthopedic Surgery

## 2019-07-12 NOTE — Anesthesia Postprocedure Evaluation (Signed)
Anesthesia Post Note  Patient: [name]  Procedure(s) Performed: EXCISION MASS foot (Left Foot)     Patient location during evaluation: PACU Anesthesia Type: General Level of consciousness: awake and alert Pain management: pain level controlled Vital Signs Assessment: post-procedure vital signs reviewed and stable Respiratory status: spontaneous breathing, nonlabored ventilation, respiratory function stable and patient connected to nasal cannula oxygen Cardiovascular status: blood pressure returned to baseline and stable Postop Assessment: no apparent nausea or vomiting Anesthetic complications: no    Last Vitals:  Vitals:   07/11/19 1500 07/11/19 1535  BP: (!) 123/58 (!) 119/51  Pulse: 72 68  Resp: 15 14  Temp:  36.7 C  SpO2: 92% 97%    Last Pain:  Vitals:   07/11/19 1535  TempSrc:   PainSc: 7                  [name] S

## 2019-07-15 ENCOUNTER — Other Ambulatory Visit: Payer: Self-pay

## 2019-07-19 ENCOUNTER — Ambulatory Visit: Payer: Medicare Other | Admitting: Endocrinology

## 2019-07-28 ENCOUNTER — Other Ambulatory Visit: Payer: Self-pay | Admitting: Endocrinology

## 2019-08-03 ENCOUNTER — Telehealth: Payer: Self-pay | Admitting: Endocrinology

## 2019-08-03 NOTE — Telephone Encounter (Signed)
Per Dr. Cordelia Pen request, please call pt to move appt to next availability.

## 2019-08-03 NOTE — Telephone Encounter (Signed)
Please move up next appt to next avail.  In person, please.

## 2019-08-03 NOTE — Telephone Encounter (Signed)
Please advise

## 2019-08-03 NOTE — Telephone Encounter (Signed)
OK, we'll do VV

## 2019-08-03 NOTE — Telephone Encounter (Signed)
Patient stated she is unable to come in due to having foot surgery.  How would you like to proceed?

## 2019-08-03 NOTE — Telephone Encounter (Signed)
Patient has called in regards to her metFORMIN (GLUCOPHAGE-XR) 500 MG 24 hr tablet being recalled. The patient would like to know what to do.  Please Advise, Thanks

## 2019-08-03 NOTE — Telephone Encounter (Signed)
Given pt constraints, please advise how you would like to proceed.

## 2019-08-04 NOTE — Telephone Encounter (Signed)
LMTCB to schedule Doxy appointment-1st available

## 2019-08-04 NOTE — Telephone Encounter (Signed)
Please refer to Dr. Cordelia Pen response

## 2019-08-10 NOTE — Telephone Encounter (Signed)
LMTCB x 2 to schedule 1st available Doxy appt

## 2019-08-12 ENCOUNTER — Other Ambulatory Visit: Payer: Self-pay | Admitting: Family Medicine

## 2019-08-12 DIAGNOSIS — M85852 Other specified disorders of bone density and structure, left thigh: Secondary | ICD-10-CM

## 2019-08-15 ENCOUNTER — Encounter: Payer: Self-pay | Admitting: Endocrinology

## 2019-08-15 ENCOUNTER — Other Ambulatory Visit: Payer: Self-pay

## 2019-08-15 ENCOUNTER — Ambulatory Visit (INDEPENDENT_AMBULATORY_CARE_PROVIDER_SITE_OTHER): Payer: Medicare Other | Admitting: Endocrinology

## 2019-08-15 VITALS — Ht 61.0 in

## 2019-08-15 DIAGNOSIS — E119 Type 2 diabetes mellitus without complications: Secondary | ICD-10-CM | POA: Diagnosis not present

## 2019-08-15 MED ORDER — METFORMIN HCL 500 MG PO TABS: 500.0000 mg | ORAL_TABLET | Freq: Two times a day (BID) | ORAL | 3 refills | Status: DC

## 2019-08-15 NOTE — Patient Instructions (Signed)
Please continue the same diabetes medications.   °Please come back for a follow-up appointment in 2 months.  °check your blood sugar once a day.  vary the time of day when you check, between before the 3 meals, and at bedtime.  also check if you have symptoms of your blood sugar being too high or too low.  please keep a record of the readings and bring it to your next appointment here (or you can bring the meter itself).  You can write it on any piece of paper.  please call us sooner if your blood sugar goes below 70, or if you have a lot of readings over 200.   ° °

## 2019-08-15 NOTE — Progress Notes (Signed)
Subjective:    Patient ID: [name], female    DOB: December 16, 1940, 78 y.o.   MRN: [id number]  HPI telehealth visit today via phone x 10 minutes Alternatives to telehealth are presented to this patient, and the patient agrees to the telehealth visit.   Pt is advised of the cost of the visit, and agrees to this, also.   Patient is at home, and I am at the office.   Persons attending the telehealth visit: the patient and I.   Pt returns for f/u of diabetes mellitus: DM type: 2 Dx'ed: 1993 Complications: none Therapy: 4 oral meds.  GDM: never DKA: never Severe hypoglycemia: never.   Pancreatitis: never. Other: she stopped bromocriptine, as she said it caused hypoglycemia; she did not tolerate invokana, due to dizziness; I advised her to learn about insulin, just in case, but she declines.   Interval history: pt says cbg varies from 97-200. she says she takes meds as rx'ed.  activity is still limited by recent foot surgery.   Past Medical History:  Diagnosis Date  . Arthritis   . Depression   . Diabetes mellitus without complication ([facility])   . Hypertension     Past Surgical History:  Procedure Laterality Date  . ABDOMINAL HYSTERECTOMY    . BACK SURGERY  2006  . EXCISION MASS LOWER EXTREMETIES Left 07/11/2019   Procedure: EXCISION MASS foot;  Surgeon: Beverely Low, MD;  [facility]: [facility];  Service: Orthopedics;  Laterality: Left;  . GALLBLADDER SURGERY  2005  . HERNIA REPAIR    . KNEE SURGERY  2001/2005    Social History   Socioeconomic History  . Marital status: Unknown    Spouse name: Not on file  . Number of children: Not on file  . Years of education: Not on file  . Highest education level: Not on file  Occupational History  . Not on file  Social Needs  . Financial resource strain: Not on file  . Food insecurity    Worry: Not on file    Inability: Not on file  . Transportation needs    Medical: Not on file    Non-medical: Not on file   Tobacco Use  . Smoking status: Former Smoker    Packs/day: 0.05    Years: 3.00    Pack years: 0.15    Types: Cigarettes    Quit date: 1980    Years since quitting: 40.7  . Smokeless tobacco: Never Used  Substance and Sexual Activity  . Alcohol use: Not Currently  . Drug use: Never  . Sexual activity: Not Currently  Lifestyle  . Physical activity    Days per week: Not on file    Minutes per session: Not on file  . Stress: Not on file  Relationships  . Social Musician on phone: Not on file    Gets together: Not on file    Attends religious service: Not on file    Active member of club or organization: Not on file    Attends meetings of clubs or organizations: Not on file    Relationship status: Not on file  . Intimate partner violence    Fear of current or ex partner: Not on file    Emotionally abused: Not on file    Physically abused: Not on file    Forced sexual activity: Not on file  Other Topics Concern  . Not on file  Social History Narrative  . Not on  file    Current Outpatient Medications on File Prior to Visit  Medication Sig Dispense Refill  . acarbose (PRECOSE) 25 MG tablet Take 1 tablet (25 mg total) by mouth 3 (three) times daily with meals. 90 tablet 11  . ACCU-CHEK SMARTVIEW test strip USE TO TEST BLOOD GLUCOSE 2 TIMES DAILY, AS INSTRUCTED. DX CODE: 250.00 200 strip 2  . acetaminophen (TYLENOL) 325 MG tablet Take 650 mg by mouth every 6 (six) hours as needed for pain.    Marland Kitchen ALPRAZolam (XANAX) 1 MG tablet Take 1 mg by mouth at bedtime as needed for sleep.    Marland Kitchen atenolol (TENORMIN) 25 MG tablet Take 1 tablet (25 mg total) by mouth daily. 90 tablet 3  . CRESTOR 5 MG tablet Take 5 mg by mouth daily.    . diclofenac sodium (VOLTAREN) 1 % GEL Apply 4 g topically 4 (four) times daily.    . enalapril (VASOTEC) 10 MG tablet enalapril maleate 10 mg tablet    . flurbiprofen (ANSAID) 100 MG tablet     . Lancets Misc. (ACCU-CHEK FASTCLIX LANCET) KIT 1 each  by Does not apply route daily. 1 kit 0  . methocarbamol (ROBAXIN) 500 MG tablet Take 500 mg by mouth 3 (three) times daily.    Marland Kitchen oxyCODONE-acetaminophen (PERCOCET) 5-325 MG tablet Take 1 tablet by mouth every 4 (four) hours as needed for severe pain. 20 tablet 0  . pioglitazone (ACTOS) 45 MG tablet Take 1 tablet (45 mg total) by mouth daily. 90 tablet 3  . promethazine (PHENERGAN) 25 MG tablet Take 25 mg by mouth every 6 (six) hours as needed for nausea or vomiting.    . repaglinide (PRANDIN) 1 MG tablet Take 1 tablet (1 mg total) by mouth 3 (three) times daily before meals. 270 tablet 3  . traMADol (ULTRAM) 50 MG tablet Take 50 mg by mouth every 6 (six) hours as needed for pain.    . Vitamin D, Ergocalciferol, (DRISDOL) 50000 UNITS CAPS capsule Take 50,000 Units by mouth every 7 (seven) days.     No current facility-administered medications on file prior to visit.     Allergies  Allergen Reactions  . Aspirin Other (See Comments)    Pt states her PCP told her not to take aspirin but she can not remember exactly why.  . Codeine Nausea And Vomiting  . Hydrocodone Nausea And Vomiting  . Morphine And Related Nausea And Vomiting  . Darvon [Propoxyphene] Nausea And Vomiting    Family History  Problem Relation Age of Onset  . Arthritis Mother   . Hyperlipidemia Mother   . Heart disease Mother   . Hypertension Mother   . Diabetes Mother   . Arthritis Father   . Hyperlipidemia Father   . Heart disease Father   . Hypertension Father   . Diabetes Father   . Alcohol abuse Sister   . Hypertension Sister   . Cancer Sister     Ht 5\' 1"  (1.549 m)   BMI 39.30 kg/m    Review of Systems She denies hypoglycemia    Objective:   Physical Exam    Lab Results  Component Value Date   CREATININE 0.60 07/11/2019   BUN 14 07/11/2019   NA 141 07/11/2019   K 4.1 07/11/2019   CL 105 07/11/2019   CO2 26 10/19/2014       Assessment & Plan:  Type 2 DM: apparently  well-controlled  Patient Instructions  Please continue the same diabetes medications.  Please come back for a follow-up appointment in 2 months.  check your blood sugar once a day.  vary the time of day when you check, between before the 3 meals, and at bedtime.  also check if you have symptoms of your blood sugar being too high or too low.  please keep a record of the readings and bring it to your next appointment here (or you can bring the meter itself).  You can write it on any piece of paper.  please call us sooner if your blood sugar goes below 70, or if you have a lot of readings over 200.

## 2019-09-06 ENCOUNTER — Ambulatory Visit (INDEPENDENT_AMBULATORY_CARE_PROVIDER_SITE_OTHER): Payer: Medicare Other | Admitting: Endocrinology

## 2019-09-06 ENCOUNTER — Other Ambulatory Visit: Payer: Self-pay

## 2019-09-06 ENCOUNTER — Encounter: Payer: Self-pay | Admitting: Endocrinology

## 2019-09-06 DIAGNOSIS — E119 Type 2 diabetes mellitus without complications: Secondary | ICD-10-CM | POA: Diagnosis not present

## 2019-09-06 NOTE — Patient Instructions (Signed)
Please continue the same diabetes medications.   °Please come back for a follow-up appointment in 2 months.  °check your blood sugar once a day.  vary the time of day when you check, between before the 3 meals, and at bedtime.  also check if you have symptoms of your blood sugar being too high or too low.  please keep a record of the readings and bring it to your next appointment here (or you can bring the meter itself).  You can write it on any piece of paper.  please call us sooner if your blood sugar goes below 70, or if you have a lot of readings over 200.   ° °

## 2019-09-06 NOTE — Progress Notes (Signed)
Subjective:    Patient ID: [name], female    DOB: 02/04/1941, 78 y.o.   MRN: [id number]  HPI telehealth visit today via phone x 10 minutes Alternatives to telehealth are presented to this patient, and the patient agrees to the telehealth visit.  Pt is advised of the cost of the visit, and agrees to this, also.   Patient is at home, and I am at the office.   Persons attending the telehealth visit: the patient and I Pt returns for f/u of diabetes mellitus: DM type: 2 Dx'ed: 1030 Complications: none Therapy: 4 oral meds.  GDM: never DKA: never Severe hypoglycemia: never.   Pancreatitis: never. Other: she stopped bromocriptine, as she said it caused hypoglycemia; she did not tolerate invokana, due to dizziness; I advised her to learn about insulin, just in case, but she declines.   Interval history: pt says cbg varies from 117-220. she says she takes meds as rx'ed.  She is recovering slowly from recent foot surgery.   Past Medical History:  Diagnosis Date   Arthritis    Depression    Diabetes mellitus without complication ([place])    Hypertension     Past Surgical History:  Procedure Laterality Date   ABDOMINAL HYSTERECTOMY     BACK SURGERY  2006   EXCISION MASS LOWER EXTREMETIES Left 07/11/2019   Procedure: EXCISION MASS foot;  Surgeon: Netta Cedars, MD;  Location: [facility];  Service: Orthopedics;  Laterality: Left;   GALLBLADDER SURGERY  2005   HERNIA REPAIR     KNEE SURGERY  2001/2005    Social History   Socioeconomic History   Marital status: Unknown    Spouse name: Not on file   Number of children: Not on file   Years of education: Not on file   Highest education level: Not on file  Occupational History   Not on file  Social Needs   Financial resource strain: Not on file   Food insecurity    Worry: Not on file    Inability: Not on file   Transportation needs    Medical: Not on file    Non-medical: Not on file   Tobacco Use   Smoking status: Former Smoker    Packs/day: 0.05    Years: 3.00    Pack years: 0.15    Types: Cigarettes    Quit date: 61    Years since quitting: 40.7   Smokeless tobacco: Never Used  Substance and Sexual Activity   Alcohol use: Not Currently   Drug use: Never   Sexual activity: Not Currently  Lifestyle   Physical activity    Days per week: Not on file    Minutes per session: Not on file   Stress: Not on file  Relationships   Social connections    Talks on phone: Not on file    Gets together: Not on file    Attends religious service: Not on file    Active member of club or organization: Not on file    Attends meetings of clubs or organizations: Not on file    Relationship status: Not on file   Intimate partner violence    Fear of current or ex partner: Not on file    Emotionally abused: Not on file    Physically abused: Not on file    Forced sexual activity: Not on file  Other Topics Concern   Not on file  Social History Narrative   Not on file  Current Outpatient Medications on File Prior to Visit  Medication Sig Dispense Refill   acarbose (PRECOSE) 25 MG tablet Take 1 tablet (25 mg total) by mouth 3 (three) times daily with meals. 90 tablet 11   ACCU-CHEK SMARTVIEW test strip USE TO TEST BLOOD GLUCOSE 2 TIMES DAILY, AS INSTRUCTED. DX CODE: 250.00 200 strip 2   acetaminophen (TYLENOL) 325 MG tablet Take 650 mg by mouth every 6 (six) hours as needed for pain.     ALPRAZolam (XANAX) 1 MG tablet Take 1 mg by mouth at bedtime as needed for sleep.     atenolol (TENORMIN) 25 MG tablet Take 1 tablet (25 mg total) by mouth daily. 90 tablet 3   CRESTOR 5 MG tablet Take 5 mg by mouth daily.     diclofenac sodium (VOLTAREN) 1 % GEL Apply 4 g topically 4 (four) times daily.     enalapril (VASOTEC) 10 MG tablet enalapril maleate 10 mg tablet     flurbiprofen (ANSAID) 100 MG tablet      Lancets Misc. (ACCU-CHEK FASTCLIX LANCET) KIT 1 each  by Does not apply route daily. 1 kit 0   metFORMIN (GLUCOPHAGE) 500 MG tablet Take 1 tablet (500 mg total) by mouth 2 (two) times daily with a meal. 180 tablet 3   methocarbamol (ROBAXIN) 500 MG tablet Take 500 mg by mouth 3 (three) times daily.     oxyCODONE-acetaminophen (PERCOCET) 5-325 MG tablet Take 1 tablet by mouth every 4 (four) hours as needed for severe pain. 20 tablet 0   pioglitazone (ACTOS) 45 MG tablet Take 1 tablet (45 mg total) by mouth daily. 90 tablet 3   promethazine (PHENERGAN) 25 MG tablet Take 25 mg by mouth every 6 (six) hours as needed for nausea or vomiting.     repaglinide (PRANDIN) 1 MG tablet Take 1 tablet (1 mg total) by mouth 3 (three) times daily before meals. 270 tablet 3   traMADol (ULTRAM) 50 MG tablet Take 50 mg by mouth every 6 (six) hours as needed for pain.     Vitamin D, Ergocalciferol, (DRISDOL) 50000 UNITS CAPS capsule Take 50,000 Units by mouth every 7 (seven) days.     No current facility-administered medications on file prior to visit.     Allergies  Allergen Reactions   Aspirin Other (See Comments)    Pt states her PCP told her not to take aspirin but she can not remember exactly why.   Codeine Nausea And Vomiting   Hydrocodone Nausea And Vomiting   Morphine And Related Nausea And Vomiting   Darvon [Propoxyphene] Nausea And Vomiting    Family History  Problem Relation Age of Onset   Arthritis Mother    Hyperlipidemia Mother    Heart disease Mother    Hypertension Mother    Diabetes Mother    Arthritis Father    Hyperlipidemia Father    Heart disease Father    Hypertension Father    Diabetes Father    Alcohol abuse Sister    Hypertension Sister    Cancer Sister       Review of Systems She denies hypoglycemia and ankle swelling.     Objective:   Physical Exam    Lab Results  Component Value Date   HGBA1C 6.3 (A) 12/07/2018   Lab Results  Component Value Date   CREATININE 0.60 07/11/2019   BUN  14 07/11/2019   NA 141 07/11/2019   K 4.1 07/11/2019   CL 105 07/11/2019   CO2 26  10/19/2014      Assessment & Plan:  Type 2 DM: well-controlled.   Patient Instructions  Please continue the same diabetes medications.   Please come back for a follow-up appointment in 2 months.  check your blood sugar once a day.  vary the time of day when you check, between before the 3 meals, and at bedtime.  also check if you have symptoms of your blood sugar being too high or too low.  please keep a record of the readings and bring it to your next appointment here (or you can bring the meter itself).  You can write it on any piece of paper.  please call us sooner if your blood sugar goes below 70, or if you have a lot of readings over 200.

## 2019-09-07 ENCOUNTER — Other Ambulatory Visit: Payer: Self-pay | Admitting: Endocrinology

## 2019-10-25 ENCOUNTER — Telehealth: Payer: Self-pay

## 2019-10-25 NOTE — Telephone Encounter (Signed)
Pt returned call. States she saw Dr. Moreen Fowler. Due to pedal edema, Dr. Moreen Fowler asked pt to stop Pioglitazone and to call Dr. Loanne Drilling to make him aware. Routing this message to Dr. Loanne Drilling to make him aware.

## 2019-10-25 NOTE — Telephone Encounter (Signed)
[place], thanks. Please come back for a follow-up appointment in 2-3 weeks

## 2019-10-25 NOTE — Telephone Encounter (Signed)
Patient called in wanting Dr nurse to give her a call.  Calling about medications    Please call and advise

## 2019-10-25 NOTE — Telephone Encounter (Signed)
Please refer to Dr. Cordelia Pen response

## 2019-10-25 NOTE — Telephone Encounter (Signed)
Attempted to return pt call to inquire further about her concerns. Call continued to ring continuously and with no option to LVM to request a returned call.

## 2019-10-26 NOTE — Telephone Encounter (Signed)
Patient is scheduled for appointment on 11/04/19 at 11:15 a.m.

## 2019-11-04 ENCOUNTER — Ambulatory Visit (INDEPENDENT_AMBULATORY_CARE_PROVIDER_SITE_OTHER): Payer: Medicare Other | Admitting: Endocrinology

## 2019-11-04 ENCOUNTER — Other Ambulatory Visit: Payer: Self-pay

## 2019-11-04 ENCOUNTER — Encounter: Payer: Self-pay | Admitting: Endocrinology

## 2019-11-04 VITALS — BP 120/70 | HR 100 | Ht 61.0 in | Wt 206.6 lb

## 2019-11-04 DIAGNOSIS — E119 Type 2 diabetes mellitus without complications: Secondary | ICD-10-CM

## 2019-11-04 DIAGNOSIS — R609 Edema, unspecified: Secondary | ICD-10-CM

## 2019-11-04 LAB — POCT GLYCOSYLATED HEMOGLOBIN (HGB A1C): Hemoglobin A1C: 7.5 % — AB (ref 4.0–5.6)

## 2019-11-04 MED ORDER — ACCU-CHEK SMARTVIEW VI STRP: 1.0000 | ORAL_STRIP | Freq: Every day | 0 refills | Status: DC

## 2019-11-04 MED ORDER — HYDROCHLOROTHIAZIDE 12.5 MG PO CAPS: 12.5000 mg | ORAL_CAPSULE | Freq: Every day | ORAL | 3 refills | Status: DC

## 2019-11-04 NOTE — Patient Instructions (Signed)
Please stay off the pioglitazone. Please come back for a follow-up appointment in 2 months.  As the pioglitazone leaves your system, we'll need to add or increase another medication to make up for it.   check your blood sugar once a day.  vary the time of day when you check, between before the 3 meals, and at bedtime.  also check if you have symptoms of your blood sugar being too high or too low.  please keep a record of the readings and bring it to your next appointment here (or you can bring the meter itself).  You can write it on any piece of paper.  please call us sooner if your blood sugar goes below 70, or if you have a lot of readings over 200.

## 2019-11-04 NOTE — Progress Notes (Signed)
Subjective:    Patient ID: [name], female    DOB: 10-24-1941, 78 y.o.   MRN: [id number]  HPI Pt returns for f/u of diabetes mellitus: DM type: 2 Dx'ed: 0086 Complications: none Therapy: 4 oral meds.  GDM: never DKA: never Severe hypoglycemia: never.   Pancreatitis: never. Other: she stopped bromocriptine, as she said it caused hypoglycemia; she did not tolerate invokana, due to dizziness; I advised her to learn about insulin, just in case, but she declines.   Interval history: He brings a record of his cbg's which I have reviewed today.  cbg varies from 97-286. she says she takes meds as rx'ed.  However, Dr Moreen Fowler stopped pioglitazone 1 week ago, due to edema.   Past Medical History:  Diagnosis Date   Arthritis    Depression    Diabetes mellitus without complication ([place])    Hypertension     Past Surgical History:  Procedure Laterality Date   ABDOMINAL HYSTERECTOMY     BACK SURGERY  2006   EXCISION MASS LOWER EXTREMETIES Left 07/11/2019   Procedure: EXCISION MASS foot;  Surgeon: Netta Cedars, MD;  Location: [facility];  Service: Orthopedics;  Laterality: Left;   GALLBLADDER SURGERY  2005   HERNIA REPAIR     KNEE SURGERY  2001/2005    Social History   Socioeconomic History   Marital status: Unknown    Spouse name: Not on file   Number of children: Not on file   Years of education: Not on file   Highest education level: Not on file  Occupational History   Not on file  Social Needs   Financial resource strain: Not on file   Food insecurity    Worry: Not on file    Inability: Not on file   Transportation needs    Medical: Not on file    Non-medical: Not on file  Tobacco Use   Smoking status: Former Smoker    Packs/day: 0.05    Years: 3.00    Pack years: 0.15    Types: Cigarettes    Quit date: 23    Years since quitting: 40.9   Smokeless tobacco: Never Used  Substance and Sexual Activity   Alcohol use: Not  Currently   Drug use: Never   Sexual activity: Not Currently  Lifestyle   Physical activity    Days per week: Not on file    Minutes per session: Not on file   Stress: Not on file  Relationships   Social connections    Talks on phone: Not on file    Gets together: Not on file    Attends religious service: Not on file    Active member of club or organization: Not on file    Attends meetings of clubs or organizations: Not on file    Relationship status: Not on file   Intimate partner violence    Fear of current or ex partner: Not on file    Emotionally abused: Not on file    Physically abused: Not on file    Forced sexual activity: Not on file  Other Topics Concern   Not on file  Social History Narrative   Not on file    Current Outpatient Medications on File Prior to Visit  Medication Sig Dispense Refill   acarbose (PRECOSE) 25 MG tablet Take 1 tablet (25 mg total) by mouth 3 (three) times daily with meals. 90 tablet 11   acetaminophen (TYLENOL) 325 MG tablet Take 650 mg  by mouth every 6 (six) hours as needed for pain.     ALPRAZolam (XANAX) 1 MG tablet Take 1 mg by mouth at bedtime as needed for sleep.     atenolol (TENORMIN) 25 MG tablet Take 1 tablet (25 mg total) by mouth daily. 90 tablet 3   CRESTOR 5 MG tablet Take 5 mg by mouth daily.     diclofenac sodium (VOLTAREN) 1 % GEL Apply 4 g topically 4 (four) times daily.     enalapril (VASOTEC) 10 MG tablet enalapril maleate 10 mg tablet     flurbiprofen (ANSAID) 100 MG tablet      Lancets Misc. (ACCU-CHEK FASTCLIX LANCET) KIT 1 each by Does not apply route daily. 1 kit 0   metFORMIN (GLUCOPHAGE) 500 MG tablet Take 1 tablet (500 mg total) by mouth 2 (two) times daily with a meal. 180 tablet 3   methocarbamol (ROBAXIN) 500 MG tablet Take 500 mg by mouth 3 (three) times daily.     oxyCODONE-acetaminophen (PERCOCET) 5-325 MG tablet Take 1 tablet by mouth every 4 (four) hours as needed for severe pain. 20  tablet 0   promethazine (PHENERGAN) 25 MG tablet Take 25 mg by mouth every 6 (six) hours as needed for nausea or vomiting.     repaglinide (PRANDIN) 1 MG tablet Take 1 tablet (1 mg total) by mouth 3 (three) times daily before meals. 270 tablet 3   traMADol (ULTRAM) 50 MG tablet Take 50 mg by mouth every 6 (six) hours as needed for pain.     Vitamin D, Ergocalciferol, (DRISDOL) 50000 UNITS CAPS capsule Take 50,000 Units by mouth every 7 (seven) days.     No current facility-administered medications on file prior to visit.     Allergies  Allergen Reactions   Aspirin Other (See Comments)    Pt states her PCP told her not to take aspirin but she can not remember exactly why.   Codeine Nausea And Vomiting   Hydrocodone Nausea And Vomiting   Morphine And Related Nausea And Vomiting   Darvon [Propoxyphene] Nausea And Vomiting    Family History  Problem Relation Age of Onset   Arthritis Mother    Hyperlipidemia Mother    Heart disease Mother    Hypertension Mother    Diabetes Mother    Arthritis Father    Hyperlipidemia Father    Heart disease Father    Hypertension Father    Diabetes Father    Alcohol abuse Sister    Hypertension Sister    Cancer Sister     BP 120/70 (BP Location: Left Arm, Patient Position: Sitting, Cuff Size: Large)    Pulse 100    Ht 5' 1"  (1.549 m)    Wt 206 lb 9.6 oz (93.7 kg)    SpO2 98%    BMI 39.04 kg/m    Review of Systems She denies hypoglycemia.      Objective:   Physical Exam VITAL SIGNS:  See vs page GENERAL: no distress Pulses: dorsalis pedis intact bilat.   MSK: no deformity of the feet CV: trace bilat leg edema, and bilat vv's.  Skin:  no ulcer on the feet.  normal color and temp on the feet. Neuro: sensation is intact to touch on the feet Ext: there is bilateral onychomycosis of the toenails.    Lab Results  Component Value Date   CREATININE 0.60 07/11/2019   BUN 14 07/11/2019   NA 141 07/11/2019   K 4.1  07/11/2019   CL  105 07/11/2019   CO2 26 10/19/2014   Lab Results  Component Value Date   HGBA1C 7.5 (A) 11/04/2019       Assessment & Plan:  Type 2 DM: well-controlled edema, new, prob due to pioglitazone  Patient Instructions  Please stay off the pioglitazone. Please come back for a follow-up appointment in 2 months.  As the pioglitazone leaves your system, we'll need to add or increase another medication to make up for it.   check your blood sugar once a day.  vary the time of day when you check, between before the 3 meals, and at bedtime.  also check if you have symptoms of your blood sugar being too high or too low.  please keep a record of the readings and bring it to your next appointment here (or you can bring the meter itself).  You can write it on any piece of paper.  please call us sooner if your blood sugar goes below 70, or if you have a lot of readings over 200.

## 2019-11-16 ENCOUNTER — Other Ambulatory Visit: Payer: Self-pay | Admitting: Endocrinology

## 2019-11-16 DIAGNOSIS — E119 Type 2 diabetes mellitus without complications: Secondary | ICD-10-CM

## 2019-11-28 ENCOUNTER — Telehealth: Payer: Self-pay | Admitting: Endocrinology

## 2019-11-28 DIAGNOSIS — E119 Type 2 diabetes mellitus without complications: Secondary | ICD-10-CM

## 2019-11-28 MED ORDER — REPAGLINIDE 1 MG PO TABS: 2.0000 mg | ORAL_TABLET | Freq: Three times a day (TID) | ORAL | 0 refills | Status: DC

## 2019-11-28 MED ORDER — RYBELSUS 3 MG PO TABS: 1.0000 | ORAL_TABLET | Freq: Every day | ORAL | 0 refills | Status: DC

## 2019-11-28 NOTE — Telephone Encounter (Signed)
FYI and scheduling

## 2019-11-28 NOTE — Telephone Encounter (Signed)
She will need to start Rybelsus 3 mg daily before breakfast and also increase her repaglinide to 2 tablets with every meal.  She will need to take Rybelsus with 4 ounces water 30 minutes before eating and may cause nausea.  She will need to see Dr. Loanne Drilling after about 3 weeks

## 2019-11-28 NOTE — Telephone Encounter (Signed)
Patient has called back a second time to check status of her call. Thinks that Dr Loanne Drilling was going add an additional medicine in order to help control her blood sugar since discontinuing the pioglitazone  11/28/19 AM blood sugar 342 (fasting) 11/28/19 @2 :45PM blood sugar is 239 ( has had nothing to eat but coffee and grits since the fasting blood sugar)

## 2019-11-28 NOTE — Telephone Encounter (Signed)
Patient called to advise that her blood sugars are in staying in the 200-300.  Needs advice on how to bring her blood sugars down.  Patient has been having stress due to a family death and taking steroid shots.  Did speak with Dr Tamala Julian at primary care office over the Christmas holiday, but was advised to add additional Metformin and then to call Dr Cordelia Pen office after the holidays.  Patient is requesting a call back to [phone]

## 2019-11-28 NOTE — Telephone Encounter (Signed)
Spoke to pt told her per Dr. Dwyane Dee She will need to start Rybelsus 3 mg daily before breakfast and also increase her repaglinide to 2 tablets with every meal.  She will need to take Rybelsus with 4 ounces water 30 minutes before eating and may cause nausea.  She will need to see Dr. Loanne Drilling after about 3 weeks. Pt verbalized understanding. Rx's sent to pharmacy and Told her someone from scheduling will call her to reschedule appt. Pt verbalized understanding.

## 2019-12-05 ENCOUNTER — Other Ambulatory Visit: Payer: Self-pay | Admitting: Endocrinology

## 2019-12-05 ENCOUNTER — Telehealth: Payer: Self-pay

## 2019-12-05 DIAGNOSIS — E119 Type 2 diabetes mellitus without complications: Secondary | ICD-10-CM

## 2019-12-05 NOTE — Telephone Encounter (Signed)
Patent called in wanting to speak with nurse about her medications and would like a phone back today    Please call and advise

## 2019-12-06 NOTE — Telephone Encounter (Signed)
Was out of the office at the time of patient's call. Returned call today but was not able to reach patient as phone continued to ring without providing a means to leave a voice message to request a returned call.

## 2019-12-15 ENCOUNTER — Encounter: Payer: Self-pay | Admitting: Endocrinology

## 2019-12-15 ENCOUNTER — Other Ambulatory Visit: Payer: Self-pay

## 2019-12-15 ENCOUNTER — Ambulatory Visit (INDEPENDENT_AMBULATORY_CARE_PROVIDER_SITE_OTHER): Payer: Medicare Other | Admitting: Endocrinology

## 2019-12-15 DIAGNOSIS — E119 Type 2 diabetes mellitus without complications: Secondary | ICD-10-CM

## 2019-12-15 MED ORDER — FARXIGA 5 MG PO TABS: 5.0000 mg | ORAL_TABLET | Freq: Every day | ORAL | 11 refills | Status: DC

## 2019-12-15 MED ORDER — REPAGLINIDE 2 MG PO TABS: 2.0000 mg | ORAL_TABLET | Freq: Three times a day (TID) | ORAL | 11 refills | Status: DC

## 2019-12-15 NOTE — Progress Notes (Signed)
Subjective:    Patient ID: [name], female    DOB: 28-Dec-1940, 79 y.o.   MRN: [id number]  HPI  telehealth visit today via phone x 31 minutes.   Alternatives to telehealth are presented to this patient, and the patient agrees to the telehealth visit.   Pt is advised of the cost of the visit, and agrees to this, also.   Patient is at home, and I am at the office.   Persons attending the telehealth visit: the patient and I.  Pt returns for f/u of diabetes mellitus: DM type: 2 Dx'ed: 8022 Complications: none Therapy: 4 oral meds.  GDM: never DKA: never Severe hypoglycemia: never.   Pancreatitis: never. Other: she stopped bromocriptine, as she said it caused hypoglycemia; she did not tolerate invokana, due to dizziness; I advised her to learn about insulin, just in case, but she declines; she did not tolerate pioglitazone (edema)   Interval history: pt says cbg varies from 131-400. she says she takes meds as rx'ed.  Pt reports nausea since on Rybelsus.   Past Medical History:  Diagnosis Date  . Arthritis   . Depression   . Diabetes mellitus without complication ([place])   . Hypertension     Past Surgical History:  Procedure Laterality Date  . ABDOMINAL HYSTERECTOMY    . BACK SURGERY  2006  . EXCISION MASS LOWER EXTREMETIES Left 07/11/2019   Procedure: EXCISION MASS foot;  Surgeon: Netta Cedars, MD;  Location: [facility];  Service: Orthopedics;  Laterality: Left;  . GALLBLADDER SURGERY  2005  . HERNIA REPAIR    . KNEE SURGERY  2001/2005    Social History   Socioeconomic History  . Marital status: Unknown    Spouse name: Not on file  . Number of children: Not on file  . Years of education: Not on file  . Highest education level: Not on file  Occupational History  . Not on file  Tobacco Use  . Smoking status: Former Smoker    Packs/day: 0.05    Years: 3.00    Pack years: 0.15    Types: Cigarettes    Quit date: 1980    Years since quitting: 41.0   . Smokeless tobacco: Never Used  Substance and Sexual Activity  . Alcohol use: Not Currently  . Drug use: Never  . Sexual activity: Not Currently  Other Topics Concern  . Not on file  Social History Narrative  . Not on file   Social Determinants of Health   Financial Resource Strain:   . Difficulty of Paying Living Expenses: Not on file  Food Insecurity:   . Worried About Charity fundraiser in the Last Year: Not on file  . Ran Out of Food in the Last Year: Not on file  Transportation Needs:   . Lack of Transportation (Medical): Not on file  . Lack of Transportation (Non-Medical): Not on file  Physical Activity:   . Days of Exercise per Week: Not on file  . Minutes of Exercise per Session: Not on file  Stress:   . Feeling of Stress : Not on file  Social Connections:   . Frequency of Communication with Friends and Family: Not on file  . Frequency of Social Gatherings with Friends and Family: Not on file  . Attends Religious Services: Not on file  . Active Member of Clubs or Organizations: Not on file  . Attends Archivist Meetings: Not on file  . Marital Status: Not on  file  Intimate Partner Violence:   . Fear of Current or Ex-Partner: Not on file  . Emotionally Abused: Not on file  . Physically Abused: Not on file  . Sexually Abused: Not on file    Current Outpatient Medications on File Prior to Visit  Medication Sig Dispense Refill  . acarbose (PRECOSE) 25 MG tablet TAKE 1 TABLET BY MOUTH 3 TIMES A DAY WITH MEALS 270 tablet 2  . acetaminophen (TYLENOL) 325 MG tablet Take 650 mg by mouth every 6 (six) hours as needed for pain.    Marland Kitchen ALPRAZolam (XANAX) 1 MG tablet Take 1 mg by mouth at bedtime as needed for sleep.    Marland Kitchen atenolol (TENORMIN) 25 MG tablet Take 1 tablet (25 mg total) by mouth daily. 90 tablet 3  . CRESTOR 5 MG tablet Take 5 mg by mouth daily.    . diclofenac sodium (VOLTAREN) 1 % GEL Apply 4 g topically 4 (four) times daily.    . enalapril  (VASOTEC) 10 MG tablet enalapril maleate 10 mg tablet    . flurbiprofen (ANSAID) 100 MG tablet     . glucose blood (ACCU-CHEK SMARTVIEW) test strip 1 each by Other route daily. E11.9 100 strip 0  . hydrochlorothiazide (MICROZIDE) 12.5 MG capsule Take 1 capsule (12.5 mg total) by mouth daily. 90 capsule 3  . Lancets Misc. (ACCU-CHEK FASTCLIX LANCET) KIT 1 each by Does not apply route daily. 1 kit 0  . metFORMIN (GLUCOPHAGE) 500 MG tablet Take 1 tablet (500 mg total) by mouth 2 (two) times daily with a meal. 180 tablet 3  . methocarbamol (ROBAXIN) 500 MG tablet Take 500 mg by mouth 3 (three) times daily.    Marland Kitchen oxyCODONE-acetaminophen (PERCOCET) 5-325 MG tablet Take 1 tablet by mouth every 4 (four) hours as needed for severe pain. 20 tablet 0  . promethazine (PHENERGAN) 25 MG tablet Take 25 mg by mouth every 6 (six) hours as needed for nausea or vomiting.    . Semaglutide (RYBELSUS) 3 MG TABS Take 1 tablet by mouth daily. 30 tablet 0  . traMADol (ULTRAM) 50 MG tablet Take 50 mg by mouth every 6 (six) hours as needed for pain.    . Vitamin D, Ergocalciferol, (DRISDOL) 50000 UNITS CAPS capsule Take 50,000 Units by mouth every 7 (seven) days.     No current facility-administered medications on file prior to visit.    Allergies  Allergen Reactions  . Aspirin Other (See Comments)    Pt states her PCP told her not to take aspirin but she can not remember exactly why.  . Codeine Nausea And Vomiting  . Hydrocodone Nausea And Vomiting  . Morphine And Related Nausea And Vomiting  . Darvon [Propoxyphene] Nausea And Vomiting    Family History  Problem Relation Age of Onset  . Arthritis Mother   . Hyperlipidemia Mother   . Heart disease Mother   . Hypertension Mother   . Diabetes Mother   . Arthritis Father   . Hyperlipidemia Father   . Heart disease Father   . Hypertension Father   . Diabetes Father   . Alcohol abuse Sister   . Hypertension Sister   . Cancer Sister     There were no vitals  taken for this visit.   Review of Systems He denies hypoglycemia.      Objective:   Physical Exam   Lab Results  Component Value Date   CREATININE 0.60 07/11/2019   BUN 14 07/11/2019   NA 141  07/11/2019   K 4.1 07/11/2019   CL 105 07/11/2019   CO2 26 10/19/2014      Assessment & Plan:  Type 2 DM: she needs increased rx Nausea: we can't increase Rybelsus now  Patient Instructions  Please stay off the pioglitazone. I have sent a prescription to your pharmacy, to add "[place]." Please come back for a follow-up appointment in 2 weeks.  As the pioglitazone leaves your system, we'll need to add or increase another medication to make up for it.   check your blood sugar once a day.  vary the time of day when you check, between before the 3 meals, and at bedtime.  also check if you have symptoms of your blood sugar being too high or too low.  please keep a record of the readings and bring it to your next appointment here (or you can bring the meter itself).  You can write it on any piece of paper.  please call us sooner if your blood sugar goes below 70, or if you have a lot of readings over 200.

## 2019-12-15 NOTE — Patient Instructions (Addendum)
Please stay off the pioglitazone. I have sent a prescription to your pharmacy, to add "[place]." Please come back for a follow-up appointment in 2 weeks.  As the pioglitazone leaves your system, we'll need to add or increase another medication to make up for it.   check your blood sugar once a day.  vary the time of day when you check, between before the 3 meals, and at bedtime.  also check if you have symptoms of your blood sugar being too high or too low.  please keep a record of the readings and bring it to your next appointment here (or you can bring the meter itself).  You can write it on any piece of paper.  please call us sooner if your blood sugar goes below 70, or if you have a lot of readings over 200.

## 2019-12-28 ENCOUNTER — Other Ambulatory Visit: Payer: Self-pay | Admitting: Endocrinology

## 2019-12-28 ENCOUNTER — Other Ambulatory Visit: Payer: Self-pay

## 2019-12-29 ENCOUNTER — Other Ambulatory Visit: Payer: Self-pay

## 2020-01-02 ENCOUNTER — Ambulatory Visit: Payer: Medicare Other | Admitting: Endocrinology

## 2020-01-09 ENCOUNTER — Other Ambulatory Visit: Payer: Self-pay | Admitting: Endocrinology

## 2020-01-09 DIAGNOSIS — E119 Type 2 diabetes mellitus without complications: Secondary | ICD-10-CM

## 2020-01-10 ENCOUNTER — Ambulatory Visit: Payer: Medicare Other | Admitting: Endocrinology

## 2020-01-17 ENCOUNTER — Ambulatory Visit: Payer: Medicare Other | Admitting: Endocrinology

## 2020-02-01 ENCOUNTER — Other Ambulatory Visit: Payer: Self-pay | Admitting: Endocrinology

## 2020-02-02 ENCOUNTER — Other Ambulatory Visit: Payer: Self-pay

## 2020-02-06 ENCOUNTER — Other Ambulatory Visit: Payer: Self-pay

## 2020-02-06 ENCOUNTER — Encounter: Payer: Self-pay | Admitting: Endocrinology

## 2020-02-06 ENCOUNTER — Ambulatory Visit (INDEPENDENT_AMBULATORY_CARE_PROVIDER_SITE_OTHER): Payer: Medicare Other | Admitting: Endocrinology

## 2020-02-06 VITALS — BP 124/70 | HR 72 | Ht 61.0 in | Wt 187.2 lb

## 2020-02-06 DIAGNOSIS — E119 Type 2 diabetes mellitus without complications: Secondary | ICD-10-CM

## 2020-02-06 LAB — POCT GLYCOSYLATED HEMOGLOBIN (HGB A1C): Hemoglobin A1C: 7.7 % — AB (ref 4.0–5.6)

## 2020-02-06 LAB — BASIC METABOLIC PANEL
BUN: 20 mg/dL (ref 6–23)
CO2: 28 mEq/L (ref 19–32)
Calcium: 9.5 mg/dL (ref 8.4–10.5)
Chloride: 97 mEq/L (ref 96–112)
Creatinine, Ser: 0.9 mg/dL (ref 0.40–1.20)
GFR: 60.68 mL/min (ref >60.00)
Glucose, Bld: 206 mg/dL — ABNORMAL HIGH (ref 70–99)
Potassium: 3.8 mEq/L (ref 3.5–5.1)
Sodium: 132 mEq/L — ABNORMAL LOW (ref 135–145)

## 2020-02-06 MED ORDER — LANTUS SOLOSTAR 100 UNIT/ML ~~LOC~~ SOPN: 10.0000 [IU] | PEN_INJECTOR | Freq: Every day | SUBCUTANEOUS | 99 refills | Status: DC

## 2020-02-06 MED ORDER — FARXIGA 10 MG PO TABS: 10.0000 mg | ORAL_TABLET | Freq: Every day | ORAL | 3 refills | Status: DC

## 2020-02-06 NOTE — Patient Instructions (Addendum)
Blood tests are requested for you today.  We'll let you know about the results.   If we can, we'll increase the Farxiga. Please come back for a follow-up appointment in 2 months.   check your blood sugar once a day.  vary the time of day when you check, between before the 3 meals, and at bedtime.  also check if you have symptoms of your blood sugar being too high or too low.  please keep a record of the readings and bring it to your next appointment here (or you can bring the meter itself).  You can write it on any piece of paper.  please call us sooner if your blood sugar goes below 70, or if you have a lot of readings over 200.   

## 2020-02-06 NOTE — Progress Notes (Signed)
Subjective:    Patient ID: [name], female    DOB: [birth year], 79 y.o.   MRN: [id number]  HPI Pt returns for f/u of diabetes mellitus: DM type: 2 Dx'ed: 1993 Complications: none Therapy: 5 oral meds.  GDM: never DKA: never Severe hypoglycemia: never.   Pancreatitis: never. Other: she stopped bromocriptine (hypoglycemia), invokana (dizziness), or pioglitazone (edema).  Interval history: pt says cbg's are in the mid-100's. she says she takes meds as rx'ed.  mild nausea persists.  She agrees to learn about insulin, just in case. Past Medical History:  Diagnosis Date  . Arthritis   . Depression   . Diabetes mellitus without complication (HCC)   . Hypertension     Past Surgical History:  Procedure Laterality Date  . ABDOMINAL HYSTERECTOMY    . BACK SURGERY  2006  . EXCISION MASS LOWER EXTREMETIES Left 07/11/2019   Procedure: EXCISION MASS foot;  Surgeon: Beverely Low, MD;  Location: [facility];  Service: Orthopedics;  Laterality: Left;  . GALLBLADDER SURGERY  2005  . HERNIA REPAIR    . KNEE SURGERY  2001/2005    Social History   Socioeconomic History  . Marital status: Unknown    Spouse name: Not on file  . Number of children: Not on file  . Years of education: Not on file  . Highest education level: Not on file  Occupational History  . Not on file  Tobacco Use  . Smoking status: Former Smoker    Packs/day: 0.05    Years: 3.00    Pack years: 0.15    Types: Cigarettes    Quit date: 1980    Years since quitting: 41.2  . Smokeless tobacco: Never Used  Substance and Sexual Activity  . Alcohol use: Not Currently  . Drug use: Never  . Sexual activity: Not Currently  Other Topics Concern  . Not on file  Social History Narrative  . Not on file   Social Determinants of Health   Financial Resource Strain:   . Difficulty of Paying Living Expenses: Not on file  Food Insecurity:   . Worried About Programme researcher, broadcasting/film/video in the Last Year: Not on  file  . Ran Out of Food in the Last Year: Not on file  Transportation Needs:   . Lack of Transportation (Medical): Not on file  . Lack of Transportation (Non-Medical): Not on file  Physical Activity:   . Days of Exercise per Week: Not on file  . Minutes of Exercise per Session: Not on file  Stress:   . Feeling of Stress : Not on file  Social Connections:   . Frequency of Communication with Friends and Family: Not on file  . Frequency of Social Gatherings with Friends and Family: Not on file  . Attends Religious Services: Not on file  . Active Member of Clubs or Organizations: Not on file  . Attends Banker Meetings: Not on file  . Marital Status: Not on file  Intimate Partner Violence:   . Fear of Current or Ex-Partner: Not on file  . Emotionally Abused: Not on file  . Physically Abused: Not on file  . Sexually Abused: Not on file    Current Outpatient Medications on File Prior to Visit  Medication Sig Dispense Refill  . acarbose (PRECOSE) 25 MG tablet TAKE 1 TABLET BY MOUTH 3 TIMES A DAY WITH MEALS 270 tablet 2  . ACCU-CHEK SMARTVIEW test strip 1 EACH BY OTHER ROUTE DAILY. E11.9 100 strip  0  . acetaminophen (TYLENOL) 325 MG tablet Take 650 mg by mouth every 6 (six) hours as needed for pain.    Marland Kitchen ALPRAZolam (XANAX) 1 MG tablet Take 1 mg by mouth at bedtime as needed for sleep.    Marland Kitchen atenolol (TENORMIN) 25 MG tablet Take 1 tablet (25 mg total) by mouth daily. 90 tablet 3  . CRESTOR 5 MG tablet Take 5 mg by mouth daily.    . diclofenac sodium (VOLTAREN) 1 % GEL Apply 4 g topically 4 (four) times daily.    . enalapril (VASOTEC) 10 MG tablet enalapril maleate 10 mg tablet    . flurbiprofen (ANSAID) 100 MG tablet     . hydrochlorothiazide (MICROZIDE) 12.5 MG capsule Take 1 capsule (12.5 mg total) by mouth daily. 90 capsule 3  . Lancets Misc. (ACCU-CHEK FASTCLIX LANCET) KIT 1 each by Does not apply route daily. 1 kit 0  . metFORMIN (GLUCOPHAGE) 500 MG tablet Take 1 tablet  (500 mg total) by mouth 2 (two) times daily with a meal. 180 tablet 3  . methocarbamol (ROBAXIN) 500 MG tablet Take 500 mg by mouth 3 (three) times daily.    Marland Kitchen oxyCODONE-acetaminophen (PERCOCET) 5-325 MG tablet Take 1 tablet by mouth every 4 (four) hours as needed for severe pain. 20 tablet 0  . promethazine (PHENERGAN) 25 MG tablet Take 25 mg by mouth every 6 (six) hours as needed for nausea or vomiting.    . repaglinide (PRANDIN) 2 MG tablet Take 1 tablet (2 mg total) by mouth 3 (three) times daily before meals. 90 tablet 11  . RYBELSUS 3 MG TABS TAKE 1 TABLET BY MOUTH EVERY DAY 30 tablet 0  . traMADol (ULTRAM) 50 MG tablet Take 50 mg by mouth every 6 (six) hours as needed for pain.    . Vitamin D, Ergocalciferol, (DRISDOL) 50000 UNITS CAPS capsule Take 50,000 Units by mouth every 7 (seven) days.     No current facility-administered medications on file prior to visit.    Allergies  Allergen Reactions  . Aspirin Other (See Comments)    Pt states her PCP told her not to take aspirin but she can not remember exactly why.  . Codeine Nausea And Vomiting  . Hydrocodone Nausea And Vomiting  . Morphine And Related Nausea And Vomiting  . Darvon [Propoxyphene] Nausea And Vomiting    Family History  Problem Relation Age of Onset  . Arthritis Mother   . Hyperlipidemia Mother   . Heart disease Mother   . Hypertension Mother   . Diabetes Mother   . Arthritis Father   . Hyperlipidemia Father   . Heart disease Father   . Hypertension Father   . Diabetes Father   . Alcohol abuse Sister   . Hypertension Sister   . Cancer Sister     BP 124/70 (BP Location: Left Arm, Patient Position: Sitting, Cuff Size: Large)   Pulse 72   Ht 5\' 1"  (1.549 m)   Wt 187 lb 3.2 oz (84.9 kg)   SpO2 98%   BMI 35.37 kg/m    Review of Systems She denies hypoglycemia    Objective:   Physical Exam VITAL SIGNS:  See vs page GENERAL: no distress Pulses: dorsalis pedis intact bilat.   MSK: no deformity of  the feet.   CV: no leg edema Skin:  no ulcer on the feet.  normal color and temp on the feet.  Old healed surgical scar at the left lat malleolus.   Neuro: sensation  is intact to touch on the feet  Lab Results  Component Value Date   HGBA1C 7.7 (A) 02/06/2020   Lab Results  Component Value Date   CREATININE 0.60 07/11/2019   BUN 14 07/11/2019   NA 141 07/11/2019   K 4.1 07/11/2019   CL 105 07/11/2019   CO2 26 10/19/2014        Assessment & Plan:  Type 2 DM: worse, prob due to discontinuation of pioglitazone.   Edema: better off pioglitazone Nausea: This limits rx options  Patient Instructions  Blood tests are requested for you today.  We'll let you know about the results.   If we can, we'll increase the [place]. Please come back for a follow-up appointment in 2 months.  check your blood sugar once a day.  vary the time of day when you check, between before the 3 meals, and at bedtime.  also check if you have symptoms of your blood sugar being too high or too low.  please keep a record of the readings and bring it to your next appointment here (or you can bring the meter itself).  You can write it on any piece of paper.  please call us sooner if your blood sugar goes below 70, or if you have a lot of readings over 200.

## 2020-02-07 ENCOUNTER — Telehealth: Payer: Self-pay

## 2020-02-07 NOTE — Telephone Encounter (Signed)
-----   Message from Renato Shin, MD sent at 02/06/2020  6:22 PM EST ----- please contact patient: Good results: I have sent a prescription to your pharmacy, to increase the [place].  I'll see you next time.

## 2020-02-07 NOTE — Telephone Encounter (Signed)
LAB RESULTS  Lab results were reviewed by Dr. Ellison. A letter has been mailed to pt home address. For future reference, letter can be found in Epic. 

## 2020-02-13 ENCOUNTER — Telehealth: Payer: Self-pay | Admitting: Endocrinology

## 2020-02-13 NOTE — Telephone Encounter (Signed)
Patient requests to be called at ph# [phone] regarding the following:  Patient received her lab results via mail and does not understand the letter (lab results). Patient would like to discuss lab results and to make sure she is taking the correct the dosage of Farxiga 5 mg.

## 2020-02-13 NOTE — Telephone Encounter (Signed)
Spoke with patient and she was confused on what mg the [place].   I told her that she is to be taking 10 mg of [place] daily.

## 2020-03-03 ENCOUNTER — Other Ambulatory Visit: Payer: Self-pay | Admitting: Endocrinology

## 2020-03-03 MED ORDER — RYBELSUS 3 MG PO TABS: 1.0000 | ORAL_TABLET | Freq: Every day | ORAL | 0 refills | Status: DC

## 2020-03-08 ENCOUNTER — Other Ambulatory Visit: Payer: Self-pay | Admitting: Endocrinology

## 2020-03-08 DIAGNOSIS — E119 Type 2 diabetes mellitus without complications: Secondary | ICD-10-CM

## 2020-03-26 ENCOUNTER — Other Ambulatory Visit: Payer: Self-pay

## 2020-03-26 ENCOUNTER — Telehealth: Payer: Self-pay | Admitting: Endocrinology

## 2020-03-26 ENCOUNTER — Other Ambulatory Visit: Payer: Self-pay | Admitting: Endocrinology

## 2020-03-26 DIAGNOSIS — E119 Type 2 diabetes mellitus without complications: Secondary | ICD-10-CM

## 2020-03-26 MED ORDER — RYBELSUS 3 MG PO TABS: 1.0000 | ORAL_TABLET | Freq: Every day | ORAL | 0 refills | Status: DC

## 2020-03-26 NOTE — Telephone Encounter (Signed)
Medication Refill Request  Did you call your pharmacy and request this refill first? Yes-Pharmacy told patient they would send refill request . If patient has not contacted pharmacy first, instruct them to do so for future refills.  . Remind them that contacting the pharmacy for their refill is the quickest method to get the refill.  . Refill policy also stated that it will take anywhere between 24-72 hours to receive the refill.    Name of medication?  Semaglutide (RYBELSUS) 3 MG TABS With Refills  Is this a 90 day supply? Yes  Name and location of pharmacy?  [id number] [place], [place] - [address] AT [place] Phone:  [phone]  Fax:  [phone]       . Is the request for diabetes test strips? No . If yes, what brand? N/A

## 2020-03-27 NOTE — Telephone Encounter (Signed)
Please refill.

## 2020-04-06 ENCOUNTER — Telehealth: Payer: Self-pay | Admitting: Endocrinology

## 2020-04-06 ENCOUNTER — Other Ambulatory Visit: Payer: Self-pay

## 2020-04-06 NOTE — Telephone Encounter (Signed)
Patient requests the following NewX for her new Meter (she only received 10 test strips with her new meter):  Medication Refill Request  Did you call your pharmacy and request this refill first? No  If patient has not contacted pharmacy first, instruct them to do so for future refills.   Remind them that contacting the pharmacy for their refill is the quickest method to get the refill.   Refill policy also stated that it will take anywhere between 24-72 hours to receive the refill.    Name of medication? Accuchek Guide Test Strips  Is this a 90 day supply? Yes  Name and location of pharmacy?  [id number] [place], [place] - [address] AT [address] Phone:  [phone]  Fax:  [phone]       Is the request for diabetes test strips? Yes  If yes, what brand? Accuchek Guide

## 2020-04-07 ENCOUNTER — Other Ambulatory Visit: Payer: Self-pay

## 2020-04-07 MED ORDER — ACCU-CHEK GUIDE VI STRP: 1.0000 | ORAL_STRIP | Freq: Every day | 0 refills | Status: DC

## 2020-04-07 NOTE — Progress Notes (Signed)
error 

## 2020-04-09 ENCOUNTER — Other Ambulatory Visit: Payer: Self-pay

## 2020-04-09 DIAGNOSIS — E119 Type 2 diabetes mellitus without complications: Secondary | ICD-10-CM

## 2020-04-09 MED ORDER — ACCU-CHEK GUIDE VI STRP: 1.0000 | ORAL_STRIP | Freq: Every day | 0 refills | Status: DC

## 2020-04-09 NOTE — Telephone Encounter (Signed)
Outpatient Medication Detail   Disp Refills Start End   glucose blood (ACCU-CHEK GUIDE) test strip 30 each 0 04/09/2020    Sig - Route: 1 each by Other route daily. E11.9; OVERDUE FOR AN APPT. WILL PROVIDE 30 DAY SUPPLY - Other   Sent to pharmacy as: glucose blood (ACCU-CHEK GUIDE) test strip   E-Prescribing Status: Receipt confirmed by pharmacy (04/09/2020  7:34 AM EDT)

## 2020-04-10 ENCOUNTER — Telehealth (INDEPENDENT_AMBULATORY_CARE_PROVIDER_SITE_OTHER): Payer: Medicare Other | Admitting: Endocrinology

## 2020-04-10 ENCOUNTER — Other Ambulatory Visit: Payer: Self-pay

## 2020-04-10 DIAGNOSIS — E119 Type 2 diabetes mellitus without complications: Secondary | ICD-10-CM

## 2020-04-10 MED ORDER — ACCU-CHEK GUIDE VI STRP: 1.0000 | ORAL_STRIP | Freq: Every day | 3 refills | Status: DC

## 2020-04-10 NOTE — Patient Instructions (Signed)
Blood tests are requested for you today.  We'll let you know about the results.   If we can, we'll increase the Farxiga. Please come back for a follow-up appointment in 2 months.   check your blood sugar once a day.  vary the time of day when you check, between before the 3 meals, and at bedtime.  also check if you have symptoms of your blood sugar being too high or too low.  please keep a record of the readings and bring it to your next appointment here (or you can bring the meter itself).  You can write it on any piece of paper.  please call us sooner if your blood sugar goes below 70, or if you have a lot of readings over 200.   

## 2020-04-10 NOTE — Progress Notes (Signed)
Subjective:    Patient ID: [name], female    DOB: 04-14-1941, 79 y.o.   MRN: [id number]  HPI  telehealth visit today via phone x 13 minutes  Alternatives to telehealth are presented to this patient, and the patient agrees to the telehealth visit.   Pt is advised of the cost of the visit, and agrees to this, also.   Patient is at home, and I am at the office.   Persons attending the telehealth visit: the patient and I Pt returns for f/u of diabetes mellitus: DM type: 2 Dx'ed: 6073 Complications: none Therapy: 5 oral meds.  GDM: never DKA: never Severe hypoglycemia: never.   Pancreatitis: never. Other: she stopped bromocriptine (hypoglycemia), invokana (dizziness), or pioglitazone (edema); she has never been on insulin, but she has learned how.   Interval history: pt says cbg's vary from 124-200. she says she takes meds as rx'ed.  pt states she feels well in general.   Past Medical History:  Diagnosis Date  . Arthritis   . Depression   . Diabetes mellitus without complication ([place])   . Hypertension     Past Surgical History:  Procedure Laterality Date  . ABDOMINAL HYSTERECTOMY    . BACK SURGERY  2006  . EXCISION MASS LOWER EXTREMETIES Left 07/11/2019   Procedure: EXCISION MASS foot;  Surgeon: Netta Cedars, MD;  Location: [facility];  Service: Orthopedics;  Laterality: Left;  . GALLBLADDER SURGERY  2005  . HERNIA REPAIR    . KNEE SURGERY  2001/2005    Social History   Socioeconomic History  . Marital status: Unknown    Spouse name: Not on file  . Number of children: Not on file  . Years of education: Not on file  . Highest education level: Not on file  Occupational History  . Not on file  Tobacco Use  . Smoking status: Former Smoker    Packs/day: 0.05    Years: 3.00    Pack years: 0.15    Types: Cigarettes    Quit date: 1980    Years since quitting: 41.3  . Smokeless tobacco: Never Used  Substance and Sexual Activity  . Alcohol use:  Not Currently  . Drug use: Never  . Sexual activity: Not Currently  Other Topics Concern  . Not on file  Social History Narrative  . Not on file   Social Determinants of Health   Financial Resource Strain:   . Difficulty of Paying Living Expenses:   Food Insecurity:   . Worried About Charity fundraiser in the Last Year:   . Arboriculturist in the Last Year:   Transportation Needs:   . Film/video editor (Medical):   Marland Kitchen Lack of Transportation (Non-Medical):   Physical Activity:   . Days of Exercise per Week:   . Minutes of Exercise per Session:   Stress:   . Feeling of Stress :   Social Connections:   . Frequency of Communication with Friends and Family:   . Frequency of Social Gatherings with Friends and Family:   . Attends Religious Services:   . Active Member of Clubs or Organizations:   . Attends Archivist Meetings:   Marland Kitchen Marital Status:   Intimate Partner Violence:   . Fear of Current or Ex-Partner:   . Emotionally Abused:   Marland Kitchen Physically Abused:   . Sexually Abused:     Current Outpatient Medications on File Prior to Visit  Medication Sig Dispense Refill  .  acarbose (PRECOSE) 25 MG tablet TAKE 1 TABLET BY MOUTH 3 TIMES A DAY WITH MEALS 270 tablet 2  . acetaminophen (TYLENOL) 325 MG tablet Take 650 mg by mouth every 6 (six) hours as needed for pain.    Marland Kitchen ALPRAZolam (XANAX) 1 MG tablet Take 1 mg by mouth at bedtime as needed for sleep.    Marland Kitchen atenolol (TENORMIN) 25 MG tablet Take 1 tablet (25 mg total) by mouth daily. 90 tablet 3  . CRESTOR 5 MG tablet Take 5 mg by mouth daily.    . dapagliflozin propanediol (FARXIGA) 10 MG TABS tablet Take 10 mg by mouth daily before breakfast. 90 tablet 3  . diclofenac sodium (VOLTAREN) 1 % GEL Apply 4 g topically 4 (four) times daily.    . enalapril (VASOTEC) 10 MG tablet enalapril maleate 10 mg tablet    . flurbiprofen (ANSAID) 100 MG tablet     . hydrochlorothiazide (MICROZIDE) 12.5 MG capsule Take 1 capsule (12.5 mg  total) by mouth daily. 90 capsule 3  . Lancets Misc. (ACCU-CHEK FASTCLIX LANCET) KIT 1 each by Does not apply route daily. 1 kit 0  . metFORMIN (GLUCOPHAGE) 500 MG tablet Take 1 tablet (500 mg total) by mouth 2 (two) times daily with a meal. 180 tablet 3  . methocarbamol (ROBAXIN) 500 MG tablet Take 500 mg by mouth 3 (three) times daily.    Marland Kitchen oxyCODONE-acetaminophen (PERCOCET) 5-325 MG tablet Take 1 tablet by mouth every 4 (four) hours as needed for severe pain. 20 tablet 0  . promethazine (PHENERGAN) 25 MG tablet Take 25 mg by mouth every 6 (six) hours as needed for nausea or vomiting.    . repaglinide (PRANDIN) 2 MG tablet Take 1 tablet (2 mg total) by mouth 3 (three) times daily before meals. 90 tablet 11  . Semaglutide (RYBELSUS) 3 MG TABS Take 1 tablet by mouth daily before breakfast. 30 tablet 0  . traMADol (ULTRAM) 50 MG tablet Take 50 mg by mouth every 6 (six) hours as needed for pain.    . Vitamin D, Ergocalciferol, (DRISDOL) 50000 UNITS CAPS capsule Take 50,000 Units by mouth every 7 (seven) days.     No current facility-administered medications on file prior to visit.    Allergies  Allergen Reactions  . Aspirin Other (See Comments)    Pt states her PCP told her not to take aspirin but she can not remember exactly why.  . Codeine Nausea And Vomiting  . Hydrocodone Nausea And Vomiting  . Morphine And Related Nausea And Vomiting  . Darvon [Propoxyphene] Nausea And Vomiting    Family History  Problem Relation Age of Onset  . Arthritis Mother   . Hyperlipidemia Mother   . Heart disease Mother   . Hypertension Mother   . Diabetes Mother   . Arthritis Father   . Hyperlipidemia Father   . Heart disease Father   . Hypertension Father   . Diabetes Father   . Alcohol abuse Sister   . Hypertension Sister   . Cancer Sister     There were no vitals taken for this visit.   Review of Systems He denies hypoglycemia.  Denies nausea.      Objective:   Physical Exam    Lab  Results  Component Value Date   CREATININE 0.90 02/06/2020   BUN 20 02/06/2020   NA 132 (L) 02/06/2020   K 3.8 02/06/2020   CL 97 02/06/2020   CO2 28 02/06/2020  Assessment & Plan:  Type 2 DM, apparently well-controlled   Patient Instructions  Blood tests are requested for you today.  We'll let you know about the results.   If we can, we'll increase the [place]. Please come back for a follow-up appointment in 2 months.  check your blood sugar once a day.  vary the time of day when you check, between before the 3 meals, and at bedtime.  also check if you have symptoms of your blood sugar being too high or too low.  please keep a record of the readings and bring it to your next appointment here (or you can bring the meter itself).  You can write it on any piece of paper.  please call us sooner if your blood sugar goes below 70, or if you have a lot of readings over 200.

## 2020-04-25 ENCOUNTER — Other Ambulatory Visit: Payer: Self-pay | Admitting: Family Medicine

## 2020-04-25 DIAGNOSIS — M8580 Other specified disorders of bone density and structure, unspecified site: Secondary | ICD-10-CM

## 2020-04-27 ENCOUNTER — Other Ambulatory Visit: Payer: Self-pay | Admitting: Endocrinology

## 2020-04-27 DIAGNOSIS — E119 Type 2 diabetes mellitus without complications: Secondary | ICD-10-CM

## 2020-05-17 ENCOUNTER — Other Ambulatory Visit: Payer: Self-pay

## 2020-05-17 ENCOUNTER — Other Ambulatory Visit: Payer: Self-pay | Admitting: Physician Assistant

## 2020-05-17 ENCOUNTER — Ambulatory Visit
Admission: RE | Admit: 2020-05-17 | Discharge: 2020-05-17 | Disposition: A | Discharge status: Home / Self Care | Payer: Medicare Other | Source: Ambulatory Visit | Attending: Physician Assistant | Admitting: Physician Assistant

## 2020-05-17 ENCOUNTER — Telehealth: Payer: Self-pay

## 2020-05-17 DIAGNOSIS — M79601 Pain in right arm: Secondary | ICD-10-CM

## 2020-05-17 DIAGNOSIS — M79602 Pain in left arm: Secondary | ICD-10-CM

## 2020-05-17 NOTE — Telephone Encounter (Signed)
[facility] for Accu chek strips. Further states pt claims she checks CBG's BID. According to Dr. Cordelia Pen orders, pt was advised to check CBG's qd. Routing this message to Dr. Loanne Drilling for new orders.

## 2020-05-18 NOTE — Telephone Encounter (Signed)
No changes will be made to pt current orders per Dr. Cordelia Pen response below.

## 2020-05-18 NOTE — Telephone Encounter (Signed)
AVS is correct

## 2020-05-21 ENCOUNTER — Other Ambulatory Visit: Payer: Self-pay

## 2020-05-21 ENCOUNTER — Telehealth: Payer: Self-pay | Admitting: Endocrinology

## 2020-05-21 DIAGNOSIS — E119 Type 2 diabetes mellitus without complications: Secondary | ICD-10-CM

## 2020-05-21 MED ORDER — ACCU-CHEK FASTCLIX LANCETS MISC: 1.0000 | Freq: Every day | 3 refills | Status: DC

## 2020-05-21 MED ORDER — ACCU-CHEK GUIDE VI STRP: 1.0000 | ORAL_STRIP | Freq: Every day | 3 refills | Status: DC

## 2020-05-21 NOTE — Telephone Encounter (Signed)
QD is correct, per medicare rules

## 2020-05-21 NOTE — Telephone Encounter (Signed)
Please review pt request. Per your response on 05/17/20, pt was to check CBG's qd. Requesting to check BID

## 2020-05-21 NOTE — Telephone Encounter (Signed)
Medication Refill Request  Did you call your pharmacy and request this refill first? Yes-PHARM told patient there are no refills and that patient must call Dr. Loanne Drilling to request RX. Patient states she is testing 2 x per day -patient requests that Dr. Loanne Drilling be told that patient's nephew has cancer  And patient is under stress. Patient only has a few test strips left and requests RX be sent asap:  If patient has not contacted pharmacy first, instruct them to do so for future refills.   Remind them that contacting the pharmacy for their refill is the quickest method to get the refill.   Refill policy also stated that it will take anywhere between 24-72 hours to receive the refill.    Name of medication? Accu-Chek Guide Me Test Strips  Is this a 90 day supply? Yes  Name and location of pharmacy?   [id number] [place] [place] - [address] AT [place] Phone:  [phone]  Fax:  [phone]       Is the request for diabetes test strips? Yes  If yes, what brand?   Accu-Chek Guide Me

## 2020-05-21 NOTE — Telephone Encounter (Signed)
Called pt and informed of Dr. Cordelia Pen response below. Verbalized acceptance and understanding.  Outpatient Medication Detail   Disp Refills Start End   glucose blood (ACCU-CHEK GUIDE) test strip 90 each 3 05/21/2020    Sig - Route: 1 each by Other route daily. E11.9 - Other   Sent to pharmacy as: glucose blood (ACCU-CHEK GUIDE) test strip   E-Prescribing Status: Receipt confirmed by pharmacy (05/21/2020  4:57 PM EDT)

## 2020-05-28 ENCOUNTER — Other Ambulatory Visit: Payer: Self-pay | Admitting: Endocrinology

## 2020-05-28 DIAGNOSIS — E119 Type 2 diabetes mellitus without complications: Secondary | ICD-10-CM

## 2020-06-12 ENCOUNTER — Telehealth (INDEPENDENT_AMBULATORY_CARE_PROVIDER_SITE_OTHER): Payer: Medicare Other | Admitting: Endocrinology

## 2020-06-12 VITALS — BP 105/51 | Ht 60.98 in | Wt 170.0 lb

## 2020-06-12 DIAGNOSIS — E119 Type 2 diabetes mellitus without complications: Secondary | ICD-10-CM

## 2020-06-12 NOTE — Progress Notes (Signed)
Subjective:    Patient ID: [name], female    DOB: [birth year], 79 y.o.   MRN: [id number]  HPI  telehealth visit today via phone x 15 minutes.   Alternatives to telehealth are presented to this patient, and the patient agrees to the telehealth visit.   Pt is advised of the cost of the visit, and agrees to this, also.   Patient is at home, and I am at the office.   Persons attending the telehealth visit: the patient and I Pt returns for f/u of diabetes mellitus: DM type: 2 Dx'ed: 7425 Complications: none Therapy: 5 oral meds.  GDM: never DKA: never Severe hypoglycemia: never.   Pancreatitis: never.  Other: she stopped bromocriptine (hypoglycemia), invokana (dizziness), or pioglitazone (edema).  Interval history: pt says cbg's vary from 142-209.  she says she takes meds as rx'ed.  mild nausea persists.  She takes intermitt phanergan.   Past Medical History:  Diagnosis Date  . Arthritis   . Depression   . Diabetes mellitus without complication ([place])   . Hypertension     Past Surgical History:  Procedure Laterality Date  . ABDOMINAL HYSTERECTOMY    . BACK SURGERY  2006  . EXCISION MASS LOWER EXTREMETIES Left 07/11/2019   Procedure: EXCISION MASS foot;  Surgeon: Netta Cedars, MD;  Location: [facility];  Service: Orthopedics;  Laterality: Left;  . GALLBLADDER SURGERY  2005  . HERNIA REPAIR    . KNEE SURGERY  2001/2005    Social History   Socioeconomic History  . Marital status: Unknown    Spouse name: Not on file  . Number of children: Not on file  . Years of education: Not on file  . Highest education level: Not on file  Occupational History  . Not on file  Tobacco Use  . Smoking status: Former Smoker    Packs/day: 0.05    Years: 3.00    Pack years: 0.15    Types: Cigarettes    Quit date: 1980    Years since quitting: 41.5  . Smokeless tobacco: Never Used  Vaping Use  . Vaping Use: Never used  Substance and Sexual Activity  . Alcohol  use: Not Currently  . Drug use: Never  . Sexual activity: Not Currently  Other Topics Concern  . Not on file  Social History Narrative  . Not on file   Social Determinants of Health   Financial Resource Strain:   . Difficulty of Paying Living Expenses:   Food Insecurity:   . Worried About Charity fundraiser in the Last Year:   . Arboriculturist in the Last Year:   Transportation Needs:   . Film/video editor (Medical):   Marland Kitchen Lack of Transportation (Non-Medical):   Physical Activity:   . Days of Exercise per Week:   . Minutes of Exercise per Session:   Stress:   . Feeling of Stress :   Social Connections:   . Frequency of Communication with Friends and Family:   . Frequency of Social Gatherings with Friends and Family:   . Attends Religious Services:   . Active Member of Clubs or Organizations:   . Attends Archivist Meetings:   Marland Kitchen Marital Status:   Intimate Partner Violence:   . Fear of Current or Ex-Partner:   . Emotionally Abused:   Marland Kitchen Physically Abused:   . Sexually Abused:     Current Outpatient Medications on File Prior to Visit  Medication Sig Dispense  Refill  . acarbose (PRECOSE) 25 MG tablet TAKE 1 TABLET BY MOUTH 3 TIMES A DAY WITH MEALS 270 tablet 2  . atenolol (TENORMIN) 25 MG tablet Take 1 tablet (25 mg total) by mouth daily. 90 tablet 3  . dapagliflozin propanediol (FARXIGA) 10 MG TABS tablet Take 10 mg by mouth daily before breakfast. 90 tablet 3  . hydrochlorothiazide (MICROZIDE) 12.5 MG capsule Take 1 capsule (12.5 mg total) by mouth daily. 90 capsule 3  . metFORMIN (GLUCOPHAGE) 500 MG tablet Take 1 tablet (500 mg total) by mouth 2 (two) times daily with a meal. 180 tablet 3  . repaglinide (PRANDIN) 2 MG tablet Take 1 tablet (2 mg total) by mouth 3 (three) times daily before meals. 90 tablet 11  . RYBELSUS 3 MG TABS TAKE 1 TABLET BY MOUTH EVERY DAY BEFORE BREAKFAST 30 tablet 0  . Accu-Chek FastClix Lancets MISC 1 each by Does not apply route  daily. E11.9 90 each 3  . acetaminophen (TYLENOL) 325 MG tablet Take 650 mg by mouth every 6 (six) hours as needed for pain.    Marland Kitchen ALPRAZolam (XANAX) 1 MG tablet Take 1 mg by mouth at bedtime as needed for sleep.    . CRESTOR 5 MG tablet Take 5 mg by mouth daily.    . diclofenac sodium (VOLTAREN) 1 % GEL Apply 4 g topically 4 (four) times daily.    . enalapril (VASOTEC) 10 MG tablet enalapril maleate 10 mg tablet    . flurbiprofen (ANSAID) 100 MG tablet     . glucose blood (ACCU-CHEK GUIDE) test strip 1 each by Other route daily. E11.9 90 each 3  . methocarbamol (ROBAXIN) 500 MG tablet Take 500 mg by mouth 3 (three) times daily.    Marland Kitchen oxyCODONE-acetaminophen (PERCOCET) 5-325 MG tablet Take 1 tablet by mouth every 4 (four) hours as needed for severe pain. 20 tablet 0  . promethazine (PHENERGAN) 25 MG tablet Take 25 mg by mouth every 6 (six) hours as needed for nausea or vomiting.    . traMADol (ULTRAM) 50 MG tablet Take 50 mg by mouth every 6 (six) hours as needed for pain.    . Vitamin D, Ergocalciferol, (DRISDOL) 50000 UNITS CAPS capsule Take 50,000 Units by mouth every 7 (seven) days.     No current facility-administered medications on file prior to visit.    Allergies  Allergen Reactions  . Aspirin Other (See Comments)    Pt states her PCP told her not to take aspirin but she can not remember exactly why.  . Codeine Nausea And Vomiting  . Hydrocodone Nausea And Vomiting  . Morphine And Related Nausea And Vomiting  . Darvon [Propoxyphene] Nausea And Vomiting    Family History  Problem Relation Age of Onset  . Arthritis Mother   . Hyperlipidemia Mother   . Heart disease Mother   . Hypertension Mother   . Diabetes Mother   . Arthritis Father   . Hyperlipidemia Father   . Heart disease Father   . Hypertension Father   . Diabetes Father   . Alcohol abuse Sister   . Hypertension Sister   . Cancer Sister     BP (!) 105/51   Ht 5' 0.98" (1.549 m)   Wt 170 lb (77.1 kg)   BMI  32.14 kg/m    Review of Systems She has lost a few lbs, due to her efforts.      Objective:   Physical Exam       Assessment &  Plan:  Type 2 DM: she needs increased rx.   Patient Instructions  Blood tests are requested for you today.  We'll let you know about the results.   If we can, we'll increase the [place]. Please come back for a follow-up appointment in 2 months.   check your blood sugar once a day.  vary the time of day when you check, between before the 3 meals, and at bedtime.  also check if you have symptoms of your blood sugar being too high or too low.  please keep a record of the readings and bring it to your next appointment here (or you can bring the meter itself).  You can write it on any piece of paper.  please call us sooner if your blood sugar goes below 70, or if you have a lot of readings over 200.

## 2020-06-12 NOTE — Patient Instructions (Addendum)
Blood tests are requested for you today.  We'll let you know about the results.   If we can, we'll increase the [place]. Please come back for a follow-up appointment in 2 months.   check your blood sugar once a day.  vary the time of day when you check, between before the 3 meals, and at bedtime.  also check if you have symptoms of your blood sugar being too high or too low.  please keep a record of the readings and bring it to your next appointment here (or you can bring the meter itself).  You can write it on any piece of paper.  please call us sooner if your blood sugar goes below 70, or if you have a lot of readings over 200.

## 2020-06-27 ENCOUNTER — Other Ambulatory Visit: Payer: Self-pay | Admitting: Endocrinology

## 2020-06-27 DIAGNOSIS — E119 Type 2 diabetes mellitus without complications: Secondary | ICD-10-CM

## 2020-07-20 ENCOUNTER — Other Ambulatory Visit: Payer: Self-pay | Admitting: Endocrinology

## 2020-07-25 ENCOUNTER — Other Ambulatory Visit: Payer: Self-pay | Admitting: Endocrinology

## 2020-07-25 DIAGNOSIS — E119 Type 2 diabetes mellitus without complications: Secondary | ICD-10-CM

## 2020-07-30 ENCOUNTER — Telehealth (INDEPENDENT_AMBULATORY_CARE_PROVIDER_SITE_OTHER): Payer: Medicare Other | Admitting: Endocrinology

## 2020-07-30 ENCOUNTER — Other Ambulatory Visit: Payer: Self-pay

## 2020-07-30 DIAGNOSIS — E119 Type 2 diabetes mellitus without complications: Secondary | ICD-10-CM

## 2020-07-30 NOTE — Patient Instructions (Addendum)
Please continue the same medications Please come back for a follow-up appointment in 2 months.   check your blood sugar once a day.  vary the time of day when you check, between before the 3 meals, and at bedtime.  also check if you have symptoms of your blood sugar being too high or too low.  please keep a record of the readings and bring it to your next appointment here (or you can bring the meter itself).  You can write it on any piece of paper.  please call us sooner if your blood sugar goes below 70, or if you have a lot of readings over 200.

## 2020-07-30 NOTE — Progress Notes (Signed)
   Subjective:    Patient ID: [name], female    DOB: 1941-11-24, 79 y.o.   MRN: [id number]  HPI telehealth visit today via phone x 23 minutes.  Alternatives to telehealth are presented to this patient, and the patient agrees to the telehealth visit. Pt is advised of the cost of the visit, and agrees to this, also.   Patient is at home, and I am at the office.   Persons attending the telehealth visit: the patient and I.  Pt returns for f/u of diabetes mellitus: DM type: 2 Dx'ed: 6222 Complications: none Therapy: 5 oral meds.  GDM: never DKA: never Severe hypoglycemia: never.   Pancreatitis: never. Other: she stopped bromocriptine (hypoglycemia), invokana (dizziness), or pioglitazone (edema).  Interval history: pt says cbg's vary from 108-210 she says she takes meds as rx'ed.  She agree to learn about insulin, just in case, but she could not go. Wilder Glade was stopped, due to hypotension.  She says BP is 120/76.     Review of Systems     Objective:   Physical Exam      Assessment & Plan:  Type 2 DM: apparently well-controlled.   Patient Instructions  Please continue the same medications Please come back for a follow-up appointment in 2 months.   check your blood sugar once a day.  vary the time of day when you check, between before the 3 meals, and at bedtime.  also check if you have symptoms of your blood sugar being too high or too low.  please keep a record of the readings and bring it to your next appointment here (or you can bring the meter itself).  You can write it on any piece of paper.  please call us sooner if your blood sugar goes below 70, or if you have a lot of readings over 200.

## 2020-08-07 ENCOUNTER — Other Ambulatory Visit: Payer: Medicare Other

## 2020-08-21 ENCOUNTER — Other Ambulatory Visit: Payer: Self-pay | Admitting: Endocrinology

## 2020-08-21 DIAGNOSIS — E119 Type 2 diabetes mellitus without complications: Secondary | ICD-10-CM

## 2020-08-24 ENCOUNTER — Other Ambulatory Visit: Payer: Self-pay | Admitting: Endocrinology

## 2020-08-24 DIAGNOSIS — E119 Type 2 diabetes mellitus without complications: Secondary | ICD-10-CM

## 2020-09-20 ENCOUNTER — Other Ambulatory Visit: Payer: Self-pay | Admitting: Endocrinology

## 2020-09-20 DIAGNOSIS — E119 Type 2 diabetes mellitus without complications: Secondary | ICD-10-CM

## 2020-09-28 ENCOUNTER — Telehealth (INDEPENDENT_AMBULATORY_CARE_PROVIDER_SITE_OTHER): Payer: Medicare Other | Admitting: Endocrinology

## 2020-09-28 ENCOUNTER — Other Ambulatory Visit: Payer: Self-pay

## 2020-09-28 ENCOUNTER — Encounter: Payer: Self-pay | Admitting: Endocrinology

## 2020-09-28 DIAGNOSIS — E119 Type 2 diabetes mellitus without complications: Secondary | ICD-10-CM

## 2020-09-28 NOTE — Progress Notes (Signed)
Subjective:    Patient ID: [name], female    DOB: 21-Jan-1941, 79 y.o.   MRN: [id number]  HPI telehealth visit today via phone x 17 minutes.  She is unable to come to the office, due to her foot being in a boot Alternatives to telehealth are presented to this patient, and the patient agrees to the telehealth visit. Pt is advised of the cost of the visit, and agrees to this, also.   Patient is at home, and I am at the office.   Persons attending the telehealth visit: the patient and I.  Pt returns for f/u of diabetes mellitus: DM type: 2 Dx'ed: 5176 Complications: none Therapy: 4 oral meds.  GDM: never DKA: never Severe hypoglycemia: never.   Pancreatitis: never. Other: she stopped bromocriptine (hypoglycemia), invokana (dizziness), Farxiga (hypotension), or pioglitazone (edema).  She agree to learn about insulin, just in case, but she could not go Interval history: pt says cbg's vary from 120-210. she says she takes meds as rx'ed.    Past Medical History:  Diagnosis Date  . Arthritis   . Depression   . Diabetes mellitus without complication ([place])   . Hypertension     Past Surgical History:  Procedure Laterality Date  . ABDOMINAL HYSTERECTOMY    . BACK SURGERY  2006  . EXCISION MASS LOWER EXTREMETIES Left 07/11/2019   Procedure: EXCISION MASS foot;  Surgeon: Netta Cedars, MD;  Location: [facility];  Service: Orthopedics;  Laterality: Left;  . GALLBLADDER SURGERY  2005  . HERNIA REPAIR    . KNEE SURGERY  2001/2005    Social History   Socioeconomic History  . Marital status: Unknown    Spouse name: Not on file  . Number of children: Not on file  . Years of education: Not on file  . Highest education level: Not on file  Occupational History  . Not on file  Tobacco Use  . Smoking status: Former Smoker    Packs/day: 0.05    Years: 3.00    Pack years: 0.15    Types: Cigarettes    Quit date: 1980    Years since quitting: 41.8  . Smokeless  tobacco: Never Used  Vaping Use  . Vaping Use: Never used  Substance and Sexual Activity  . Alcohol use: Not Currently  . Drug use: Never  . Sexual activity: Not Currently  Other Topics Concern  . Not on file  Social History Narrative  . Not on file   Social Determinants of Health   Financial Resource Strain:   . Difficulty of Paying Living Expenses: Not on file  Food Insecurity:   . Worried About Charity fundraiser in the Last Year: Not on file  . Ran Out of Food in the Last Year: Not on file  Transportation Needs:   . Lack of Transportation (Medical): Not on file  . Lack of Transportation (Non-Medical): Not on file  Physical Activity:   . Days of Exercise per Week: Not on file  . Minutes of Exercise per Session: Not on file  Stress:   . Feeling of Stress : Not on file  Social Connections:   . Frequency of Communication with Friends and Family: Not on file  . Frequency of Social Gatherings with Friends and Family: Not on file  . Attends Religious Services: Not on file  . Active Member of Clubs or Organizations: Not on file  . Attends Archivist Meetings: Not on file  . Marital  Status: Not on file  Intimate Partner Violence:   . Fear of Current or Ex-Partner: Not on file  . Emotionally Abused: Not on file  . Physically Abused: Not on file  . Sexually Abused: Not on file    Current Outpatient Medications on File Prior to Visit  Medication Sig Dispense Refill  . acarbose (PRECOSE) 25 MG tablet TAKE 1 TABLET BY MOUTH 3 TIMES A DAY WITH MEALS 270 tablet 2  . Accu-Chek FastClix Lancets MISC 1 each by Does not apply route daily. E11.9 90 each 3  . ALPRAZolam (XANAX) 1 MG tablet Take 1 mg by mouth at bedtime as needed for sleep.    Marland Kitchen atenolol (TENORMIN) 25 MG tablet Take 1 tablet (25 mg total) by mouth daily. 90 tablet 3  . CRESTOR 5 MG tablet Take 5 mg by mouth daily.    . diclofenac sodium (VOLTAREN) 1 % GEL Apply 4 g topically 4 (four) times daily.    .  flurbiprofen (ANSAID) 100 MG tablet     . glucose blood (ACCU-CHEK GUIDE) test strip 1 each by Other route daily. E11.9 90 each 3  . metFORMIN (GLUCOPHAGE) 500 MG tablet TAKE 1 TABLET (500 MG TOTAL) BY MOUTH 2 (TWO) TIMES DAILY WITH A MEAL. 180 tablet 3  . methocarbamol (ROBAXIN) 500 MG tablet Take 500 mg by mouth 3 (three) times daily.    . promethazine (PHENERGAN) 25 MG tablet Take 25 mg by mouth every 6 (six) hours as needed for nausea or vomiting.    . repaglinide (PRANDIN) 2 MG tablet Take 1 tablet (2 mg total) by mouth 3 (three) times daily before meals. 90 tablet 11  . RYBELSUS 3 MG TABS TAKE 1 TABLET BY MOUTH EVERY DAY BEFORE BREAKFAST 30 tablet 0  . traMADol (ULTRAM) 50 MG tablet Take 50 mg by mouth every 6 (six) hours as needed for pain.    . Vitamin D, Ergocalciferol, (DRISDOL) 50000 UNITS CAPS capsule Take 50,000 Units by mouth every 7 (seven) days.    Marland Kitchen acetaminophen (TYLENOL) 325 MG tablet Take 650 mg by mouth every 6 (six) hours as needed for pain. (Patient not taking: Reported on 09/28/2020)    . enalapril (VASOTEC) 10 MG tablet enalapril maleate 10 mg tablet (Patient not taking: Reported on 09/28/2020)    . hydrochlorothiazide (MICROZIDE) 12.5 MG capsule Take 1 capsule (12.5 mg total) by mouth daily. (Patient not taking: Reported on 09/28/2020) 90 capsule 3   No current facility-administered medications on file prior to visit.    Allergies  Allergen Reactions  . Aspirin Other (See Comments)    Pt states her PCP told her not to take aspirin but she can not remember exactly why.  . Codeine Nausea And Vomiting  . Hydrocodone Nausea And Vomiting  . Morphine And Related Nausea And Vomiting  . Darvon [Propoxyphene] Nausea And Vomiting    Family History  Problem Relation Age of Onset  . Arthritis Mother   . Hyperlipidemia Mother   . Heart disease Mother   . Hypertension Mother   . Diabetes Mother   . Arthritis Father   . Hyperlipidemia Father   . Heart disease Father   .  Hypertension Father   . Diabetes Father   . Alcohol abuse Sister   . Hypertension Sister   . Cancer Sister     Wt 173 lb (78.5 kg)   BMI 32.71 kg/m    Review of Systems     Objective:   Physical Exam  Assessment & Plan:  Type 2 DM: apparently well-controlled.    Patient Instructions  Please continue the same medications At your wellness visit next month, please ask that they check the A1c Please come back for a follow-up appointment in 3 months.   check your blood sugar once a day.  vary the time of day when you check, between before the 3 meals, and at bedtime.  also check if you have symptoms of your blood sugar being too high or too low.  please keep a record of the readings and bring it to your next appointment here (or you can bring the meter itself).  You can write it on any piece of paper.  please call us sooner if your blood sugar goes below 70, or if you have a lot of readings over 200.

## 2020-09-28 NOTE — Patient Instructions (Addendum)
Please continue the same medications At your wellness visit next month, please ask that they check the A1c Please come back for a follow-up appointment in 3 months.   check your blood sugar once a day.  vary the time of day when you check, between before the 3 meals, and at bedtime.  also check if you have symptoms of your blood sugar being too high or too low.  please keep a record of the readings and bring it to your next appointment here (or you can bring the meter itself).  You can write it on any piece of paper.  please call us sooner if your blood sugar goes below 70, or if you have a lot of readings over 200.

## 2020-10-04 ENCOUNTER — Other Ambulatory Visit: Payer: Self-pay | Admitting: Endocrinology

## 2020-10-04 DIAGNOSIS — E119 Type 2 diabetes mellitus without complications: Secondary | ICD-10-CM

## 2020-10-22 ENCOUNTER — Other Ambulatory Visit: Payer: Self-pay | Admitting: Endocrinology

## 2020-10-22 DIAGNOSIS — E119 Type 2 diabetes mellitus without complications: Secondary | ICD-10-CM

## 2020-11-04 ENCOUNTER — Emergency Department (HOSPITAL_COMMUNITY)
Admission: EM | Admit: 2020-11-04 | Discharge: 2020-11-04 | Disposition: A | Discharge status: Home / Self Care | Payer: Medicare Other | Attending: Emergency Medicine | Admitting: Emergency Medicine

## 2020-11-04 ENCOUNTER — Other Ambulatory Visit: Payer: Self-pay

## 2020-11-04 ENCOUNTER — Encounter (HOSPITAL_COMMUNITY): Payer: Self-pay | Admitting: Emergency Medicine

## 2020-11-04 DIAGNOSIS — E119 Type 2 diabetes mellitus without complications: Secondary | ICD-10-CM | POA: Insufficient documentation

## 2020-11-04 DIAGNOSIS — Z87891 Personal history of nicotine dependence: Secondary | ICD-10-CM | POA: Insufficient documentation

## 2020-11-04 DIAGNOSIS — I10 Essential (primary) hypertension: Secondary | ICD-10-CM | POA: Diagnosis not present

## 2020-11-04 DIAGNOSIS — Z7984 Long term (current) use of oral hypoglycemic drugs: Secondary | ICD-10-CM | POA: Insufficient documentation

## 2020-11-04 DIAGNOSIS — Z79899 Other long term (current) drug therapy: Secondary | ICD-10-CM | POA: Insufficient documentation

## 2020-11-04 DIAGNOSIS — M79671 Pain in right foot: Secondary | ICD-10-CM | POA: Diagnosis present

## 2020-11-04 DIAGNOSIS — G8929 Other chronic pain: Secondary | ICD-10-CM | POA: Insufficient documentation

## 2020-11-04 LAB — CBG MONITORING, ED: Glucose-Capillary: 155 mg/dL — ABNORMAL HIGH (ref 70–99)

## 2020-11-04 MED ORDER — OXYCODONE-ACETAMINOPHEN 5-325 MG PO TABS
1.0000 | ORAL_TABLET | Freq: Once | ORAL | Status: AC
  Administered 2020-11-04: 1 via ORAL
  Filled 2020-11-04: qty 1

## 2020-11-04 MED ORDER — OXYCODONE-ACETAMINOPHEN 5-325 MG PO TABS: 2.0000 | ORAL_TABLET | ORAL | 0 refills | Status: DC | PRN

## 2020-11-04 NOTE — ED Provider Notes (Signed)
[facility] DEPT Provider Note   CSN: [id number] Arrival date & time: 11/04/20  0316     History Chief Complaint  Patient presents with  . Foot Pain    chronic    [name] is a 79 y.o. female.  79 year old female presents with chronic l right foot pain that has been going on for some time.  Patient is to see orthopedist in 2 days for similar complaints.  Had MRI recently which shows fractures in her foot.  Has been taking Ultram without relief peer denies any new injury.  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        Past Medical History:  Diagnosis Date  . Arthritis   . Depression   . Diabetes mellitus without complication ([place])   . Hypertension     Patient Active Problem List   Diagnosis Date Noted  . HTN (hypertension) 01/20/2013  . Anxiety state, unspecified 01/20/2013  . Diabetes ([place]) 01/19/2013    Past Surgical History:  Procedure Laterality Date  . ABDOMINAL HYSTERECTOMY    . BACK SURGERY  2006  . EXCISION MASS LOWER EXTREMETIES Left 07/11/2019   Procedure: EXCISION MASS foot;  Surgeon: Netta Cedars, MD;  Location: [facility];  Service: Orthopedics;  Laterality: Left;  . GALLBLADDER SURGERY  2005  . HERNIA REPAIR    . KNEE SURGERY  2001/2005     OB History   No obstetric history on file.     Family History  Problem Relation Age of Onset  . Arthritis Mother   . Hyperlipidemia Mother   . Heart disease Mother   . Hypertension Mother   . Diabetes Mother   . Arthritis Father   . Hyperlipidemia Father   . Heart disease Father   . Hypertension Father   . Diabetes Father   . Alcohol abuse Sister   . Hypertension Sister   . Cancer Sister     Social History   Tobacco Use  . Smoking status: Former Smoker    Packs/day: 0.05    Years: 3.00    Pack years: 0.15    Types: Cigarettes    Quit date: 1980    Years since quitting: 41.9  . Smokeless tobacco: Never Used  Vaping Use  . Vaping  Use: Never used  Substance Use Topics  . Alcohol use: Not Currently  . Drug use: Never    Home Medications Prior to Admission medications   Medication Sig Start Date End Date Taking? Authorizing Provider  acarbose (PRECOSE) 25 MG tablet TAKE 1 TABLET BY MOUTH 3 TIMES A DAY WITH MEALS 08/21/20   Renato Shin, MD  Accu-Chek FastClix Lancets MISC 1 each by Does not apply route daily. E11.9 05/21/20   Renato Shin, MD  ACCU-CHEK GUIDE test strip USE TO TEST BLOOD GLUCOSE 2 TIMES DAILY, AS INSTRUCTED. DX CODE: 250.00 10/04/20   Renato Shin, MD  acetaminophen (TYLENOL) 325 MG tablet Take 650 mg by mouth every 6 (six) hours as needed for pain. Patient not taking: Reported on 09/28/2020    [provider]  ALPRAZolam Duanne Moron) 1 MG tablet Take 1 mg by mouth at bedtime as needed for sleep.    [provider]  atenolol (TENORMIN) 25 MG tablet Take 1 tablet (25 mg total) by mouth daily. 02/23/18   Renato Shin, MD  CRESTOR 5 MG tablet Take 5 mg by mouth daily. 11/13/15   [provider]  diclofenac sodium (VOLTAREN) 1 % GEL Apply  4 g topically 4 (four) times daily.    [provider]  enalapril (VASOTEC) 10 MG tablet enalapril maleate 10 mg tablet Patient not taking: Reported on 09/28/2020    [provider]  flurbiprofen (ANSAID) 100 MG tablet  02/15/16   [provider]  hydrochlorothiazide (MICROZIDE) 12.5 MG capsule Take 1 capsule (12.5 mg total) by mouth daily. Patient not taking: Reported on 09/28/2020 11/04/19   Renato Shin, MD  metFORMIN (GLUCOPHAGE) 500 MG tablet TAKE 1 TABLET (500 MG TOTAL) BY MOUTH 2 (TWO) TIMES DAILY WITH A MEAL. 07/20/20   Renato Shin, MD  methocarbamol (ROBAXIN) 500 MG tablet Take 500 mg by mouth 3 (three) times daily.    [provider]  promethazine (PHENERGAN) 25 MG tablet Take 25 mg by mouth every 6 (six) hours as needed for nausea or vomiting.    [provider]  repaglinide (PRANDIN) 2 MG  tablet Take 1 tablet (2 mg total) by mouth 3 (three) times daily before meals. 12/15/19   Renato Shin, MD  RYBELSUS 3 MG TABS TAKE 1 TABLET BY MOUTH EVERY DAY BEFORE BREAKFAST 10/22/20   Renato Shin, MD  traMADol (ULTRAM) 50 MG tablet Take 50 mg by mouth every 6 (six) hours as needed for pain.    [provider]  Vitamin D, Ergocalciferol, (DRISDOL) 50000 UNITS CAPS capsule Take 50,000 Units by mouth every 7 (seven) days.    [provider]    Allergies    Aspirin, Codeine, Hydrocodone, Morphine and related, and Darvon [propoxyphene]  Review of Systems   Review of Systems  All other systems reviewed and are negative.   Physical Exam Updated Vital Signs BP (!) 146/82 (BP Location: Left Arm)   Pulse 80   Temp 98.3 F (36.8 C) (Oral)   Resp 16   Ht 1.549 m (5\' 1" )   Wt 77.1 kg   SpO2 98%   BMI 32.12 kg/m   Physical Exam Vitals and nursing note reviewed.  Constitutional:      General: She is not in acute distress.    Appearance: Normal appearance. She is well-developed. She is not toxic-appearing.  HENT:     Head: Normocephalic and atraumatic.  Eyes:     General: Lids are normal.     Conjunctiva/sclera: Conjunctivae normal.     Pupils: Pupils are equal, round, and reactive to light.  Neck:     Thyroid: No thyroid mass.     Trachea: No tracheal deviation.  Cardiovascular:     Rate and Rhythm: Normal rate and regular rhythm.     Heart sounds: Normal heart sounds. No murmur heard.  No gallop.   Pulmonary:     Effort: Pulmonary effort is normal. No respiratory distress.     Breath sounds: Normal breath sounds. No stridor. No decreased breath sounds, wheezing, rhonchi or rales.  Abdominal:     General: Bowel sounds are normal. There is no distension.     Palpations: Abdomen is soft.     Tenderness: There is no abdominal tenderness. There is no rebound.  Musculoskeletal:        General: No tenderness. Normal range of motion.     Cervical back: Normal  range of motion and neck supple.  Feet:     Comments: Right foot without evidence of infection.  No edema noted.  Neurovascular intact distally.  Tenderness diffusely throughout the foot Skin:    General: Skin is warm and dry.     Findings: No abrasion or  rash.  Neurological:     Mental Status: She is alert and oriented to person, place, and time.     GCS: GCS eye subscore is 4. GCS verbal subscore is 5. GCS motor subscore is 6.     Cranial Nerves: No cranial nerve deficit.     Sensory: No sensory deficit.  Psychiatric:        Speech: Speech normal.        Behavior: Behavior normal.     ED Results / Procedures / Treatments   Labs (all labs ordered are listed, but only abnormal results are displayed) Labs Reviewed  CBG MONITORING, ED - Abnormal; Notable for the following components:      Result Value   Glucose-Capillary 155 (*)    All other components within normal limits    EKG None  Radiology No results found.  Procedures Procedures (including critical care time)  Medications Ordered in ED Medications  oxyCODONE-acetaminophen (PERCOCET/ROXICET) 5-325 MG per tablet 1 tablet (has no administration in time range)    ED Course  I have reviewed the triage vital signs and the nursing notes.  Pertinent labs & imaging results that were available during my care of the patient were reviewed by me and considered in my medical decision making (see chart for details).    MDM Rules/Calculators/A&P                          Patient given Percocet here and will be given a small prescription for same and will follow with orthopedics Final Clinical Impression(s) / ED Diagnoses Final diagnoses:  None    Rx / DC Orders ED Discharge Orders    None       Lacretia Leigh, MD 11/04/20 [phone]

## 2020-11-04 NOTE — ED Triage Notes (Signed)
Patient has a foot fracture that she is being followed by [facility] for. Patient states she has not been able to get a hold of her doctor for pain medication. Patient states he had prescribed percocet.

## 2020-11-04 NOTE — ED Notes (Addendum)
Patient states she feels like her sugar is low, CBG checked

## 2020-11-04 NOTE — Discharge Instructions (Addendum)
Follow-up with Dr. Veverly Fells next week as scheduled

## 2020-11-04 NOTE — ED Notes (Signed)
Pt. CBG 155, RN, Clarise Cruz made aware.

## 2020-11-16 ENCOUNTER — Other Ambulatory Visit: Payer: Self-pay | Admitting: Family Medicine

## 2020-11-16 DIAGNOSIS — M85852 Other specified disorders of bone density and structure, left thigh: Secondary | ICD-10-CM

## 2020-12-03 ENCOUNTER — Other Ambulatory Visit: Payer: Self-pay | Admitting: Endocrinology

## 2020-12-11 ENCOUNTER — Telehealth (INDEPENDENT_AMBULATORY_CARE_PROVIDER_SITE_OTHER): Payer: Medicare Other | Admitting: Endocrinology

## 2020-12-11 ENCOUNTER — Encounter: Payer: Self-pay | Admitting: Endocrinology

## 2020-12-11 ENCOUNTER — Other Ambulatory Visit: Payer: Self-pay

## 2020-12-11 VITALS — Ht 61.0 in | Wt 187.0 lb

## 2020-12-11 DIAGNOSIS — E039 Hypothyroidism, unspecified: Secondary | ICD-10-CM

## 2020-12-11 DIAGNOSIS — E119 Type 2 diabetes mellitus without complications: Secondary | ICD-10-CM | POA: Diagnosis not present

## 2020-12-11 MED ORDER — LEVOTHYROXINE SODIUM 25 MCG PO TABS: 25.0000 ug | ORAL_TABLET | Freq: Every day | ORAL | 3 refills | Status: DC

## 2020-12-11 NOTE — Progress Notes (Signed)
Subjective:    Patient ID: [name], female    DOB: 1941/04/30, 80 y.o.   MRN: [id number]  HPI telehealth visit today via phone x 24 minutes.  She is unable to come to the office, due to her foot still being in a boot.   Alternatives to telehealth are presented to this patient, and the patient agrees to the telehealth visit.   Pt is advised of the cost of the visit, and agrees to this, also.   Patient is at home, and I am at the office.   Persons attending the telehealth visit: the patient and I.  Pt returns for f/u of diabetes mellitus: DM type: 2 Dx'ed: 3295 Complications: none.  Therapy: 4 oral meds.  GDM: never DKA: never Severe hypoglycemia: never.   Pancreatitis: never. Other: she stopped bromocriptine (hypoglycemia), invokana (dizziness), Farxiga (hypotension), or pioglitazone (edema).  She agree to learn about insulin, just in case, but she could not go Interval history: pt says cbg's are in the 100's. she says she takes meds as rx'ed.   Last A1c per epic and [facility] was 02/06/20.  pt states she feels well in general, except for fatigue.    Past Medical History:  Diagnosis Date  . Arthritis   . Depression   . Diabetes mellitus without complication ([place])   . Hypertension     Past Surgical History:  Procedure Laterality Date  . ABDOMINAL HYSTERECTOMY    . BACK SURGERY  2006  . EXCISION MASS LOWER EXTREMETIES Left 07/11/2019   Procedure: EXCISION MASS foot;  Surgeon: Netta Cedars, MD;  Location: [facility];  Service: Orthopedics;  Laterality: Left;  . GALLBLADDER SURGERY  2005  . HERNIA REPAIR    . KNEE SURGERY  2001/2005    Social History   Socioeconomic History  . Marital status: Unknown    Spouse name: Not on file  . Number of children: Not on file  . Years of education: Not on file  . Highest education level: Not on file  Occupational History  . Not on file  Tobacco Use  . Smoking status: Former Smoker    Packs/day: 0.05    Years: 3.00     Pack years: 0.15    Types: Cigarettes    Quit date: 1980    Years since quitting: 42.0  . Smokeless tobacco: Never Used  Vaping Use  . Vaping Use: Never used  Substance and Sexual Activity  . Alcohol use: Not Currently  . Drug use: Never  . Sexual activity: Not Currently  Other Topics Concern  . Not on file  Social History Narrative  . Not on file   Social Determinants of Health   Financial Resource Strain: Not on file  Food Insecurity: Not on file  Transportation Needs: Not on file  Physical Activity: Not on file  Stress: Not on file  Social Connections: Not on file  Intimate Partner Violence: Not on file    Current Outpatient Medications on File Prior to Visit  Medication Sig Dispense Refill  . acarbose (PRECOSE) 25 MG tablet TAKE 1 TABLET BY MOUTH 3 TIMES A DAY WITH MEALS 270 tablet 2  . Accu-Chek FastClix Lancets MISC 1 each by Does not apply route daily. E11.9 90 each 3  . ACCU-CHEK GUIDE test strip USE TO TEST BLOOD GLUCOSE 2 TIMES DAILY, AS INSTRUCTED. DX CODE: 250.00 200 strip 2  . acetaminophen (TYLENOL) 325 MG tablet Take 650 mg by mouth every 6 (six) hours as needed  for pain.    Marland Kitchen ALPRAZolam (XANAX) 1 MG tablet Take 1 mg by mouth at bedtime as needed for sleep.    Marland Kitchen atenolol (TENORMIN) 25 MG tablet Take 1 tablet (25 mg total) by mouth daily. 90 tablet 3  . CRESTOR 5 MG tablet Take 5 mg by mouth daily.    . diclofenac sodium (VOLTAREN) 1 % GEL Apply 4 g topically 4 (four) times daily.    . enalapril (VASOTEC) 10 MG tablet enalapril maleate 10 mg tablet    . flurbiprofen (ANSAID) 100 MG tablet     . gabapentin (NEURONTIN) 300 MG capsule Take 300 mg by mouth 3 (three) times daily.    . hydrochlorothiazide (MICROZIDE) 12.5 MG capsule Take 1 capsule (12.5 mg total) by mouth daily. 90 capsule 3  . metFORMIN (GLUCOPHAGE) 500 MG tablet TAKE 1 TABLET (500 MG TOTAL) BY MOUTH 2 (TWO) TIMES DAILY WITH A MEAL. 180 tablet 3  . methocarbamol (ROBAXIN) 500 MG tablet Take 500 mg  by mouth 3 (three) times daily.    Marland Kitchen oxyCODONE-acetaminophen (PERCOCET/ROXICET) 5-325 MG tablet Take 2 tablets by mouth every 4 (four) hours as needed for severe pain. 8 tablet 0  . promethazine (PHENERGAN) 25 MG tablet Take 25 mg by mouth every 6 (six) hours as needed for nausea or vomiting.    . repaglinide (PRANDIN) 2 MG tablet TAKE 1 TABLET (2 MG TOTAL) BY MOUTH 3 (THREE) TIMES DAILY BEFORE MEALS. 270 tablet 3  . RYBELSUS 3 MG TABS TAKE 1 TABLET BY MOUTH EVERY DAY BEFORE BREAKFAST 90 tablet 0  . traMADol (ULTRAM) 50 MG tablet Take 50 mg by mouth every 6 (six) hours as needed for pain.    . Vitamin D, Ergocalciferol, (DRISDOL) 50000 UNITS CAPS capsule Take 50,000 Units by mouth every 7 (seven) days.     No current facility-administered medications on file prior to visit.    Allergies  Allergen Reactions  . Aspirin Other (See Comments)    Pt states her PCP told her not to take aspirin but she can not remember exactly why.  . Codeine Nausea And Vomiting  . Hydrocodone Nausea And Vomiting  . Morphine And Related Nausea And Vomiting  . Darvon [Propoxyphene] Nausea And Vomiting    Family History  Problem Relation Age of Onset  . Arthritis Mother   . Hyperlipidemia Mother   . Heart disease Mother   . Hypertension Mother   . Diabetes Mother   . Arthritis Father   . Hyperlipidemia Father   . Heart disease Father   . Hypertension Father   . Diabetes Father   . Alcohol abuse Sister   . Hypertension Sister   . Cancer Sister     Ht 5\' 1"  (1.549 m)   Wt 187 lb (84.8 kg)   BMI 35.33 kg/m    Review of Systems     Objective:   Physical Exam   outside test results are reviewed: TSH=6.2    Assessment & Plan:  Hypothyroidism, new. Mild. We discussed rx vs observation.  She chooses rx.  I told pt that Dr Moreen Fowler did not send rx because the blood test was just mildly off.  That is a very reasonable approach. Type 2 DM: well-controlled  Patient Instructions  I have sent a  prescription to your pharmacy, for the thyroid. Please continue the same 4 diabetes medications.   Please come back for a follow-up appointment in 2 months.   check your blood sugar once a day.  vary the time of day when you check, between before the 3 meals, and at bedtime.  also check if you have symptoms of your blood sugar being too high or too low.  please keep a record of the readings and bring it to your next appointment here (or you can bring the meter itself).  You can write it on any piece of paper.  please call us sooner if your blood sugar goes below 70, or if you have a lot of readings over 200.

## 2020-12-11 NOTE — Patient Instructions (Addendum)
I have sent a prescription to your pharmacy, for the thyroid. Please continue the same 4 diabetes medications.   Please come back for a follow-up appointment in 2 months.   check your blood sugar once a day.  vary the time of day when you check, between before the 3 meals, and at bedtime.  also check if you have symptoms of your blood sugar being too high or too low.  please keep a record of the readings and bring it to your next appointment here (or you can bring the meter itself).  You can write it on any piece of paper.  please call us sooner if your blood sugar goes below 70, or if you have a lot of readings over 200.

## 2020-12-12 DIAGNOSIS — M542 Cervicalgia: Secondary | ICD-10-CM | POA: Diagnosis not present

## 2020-12-15 DIAGNOSIS — E039 Hypothyroidism, unspecified: Secondary | ICD-10-CM | POA: Insufficient documentation

## 2020-12-27 DIAGNOSIS — M79671 Pain in right foot: Secondary | ICD-10-CM | POA: Diagnosis not present

## 2021-01-09 ENCOUNTER — Other Ambulatory Visit: Payer: Self-pay | Admitting: Endocrinology

## 2021-01-09 ENCOUNTER — Telehealth: Payer: Self-pay | Admitting: Endocrinology

## 2021-01-09 DIAGNOSIS — E119 Type 2 diabetes mellitus without complications: Secondary | ICD-10-CM

## 2021-01-09 NOTE — Telephone Encounter (Signed)
Pt called to request a refill on rybelsus  [id number] - [place], [place] - [place] Phone:  [phone]  Fax:  [phone]

## 2021-01-11 NOTE — Telephone Encounter (Signed)
Rybelsus was sent/confirmed with the pharmacy on 01/11/2020

## 2021-01-12 DIAGNOSIS — M542 Cervicalgia: Secondary | ICD-10-CM | POA: Diagnosis not present

## 2021-01-22 ENCOUNTER — Other Ambulatory Visit: Payer: Self-pay

## 2021-01-22 ENCOUNTER — Telehealth: Payer: Self-pay | Admitting: Endocrinology

## 2021-01-22 NOTE — Telephone Encounter (Signed)
Patient wants Dr. Loanne Drilling to know that Patient's oldest son passed away on 23-Feb-2023 (Jan 21, 2021).

## 2021-01-22 NOTE — Telephone Encounter (Signed)
FYI

## 2021-01-24 ENCOUNTER — Ambulatory Visit (INDEPENDENT_AMBULATORY_CARE_PROVIDER_SITE_OTHER): Payer: Medicare Other | Admitting: Endocrinology

## 2021-01-24 ENCOUNTER — Other Ambulatory Visit: Payer: Self-pay

## 2021-01-24 VITALS — BP 126/78 | HR 99 | Ht 61.0 in | Wt 170.4 lb

## 2021-01-24 DIAGNOSIS — E119 Type 2 diabetes mellitus without complications: Secondary | ICD-10-CM

## 2021-01-24 LAB — POCT GLYCOSYLATED HEMOGLOBIN (HGB A1C): Hemoglobin A1C: 7 % — AB (ref 4.0–5.6)

## 2021-01-24 MED ORDER — LEVOTHYROXINE SODIUM 50 MCG PO TABS: 50.0000 ug | ORAL_TABLET | Freq: Every day | ORAL | 3 refills | Status: DC

## 2021-01-24 NOTE — Progress Notes (Signed)
Subjective:    Patient ID: [name], female    DOB: 1941-01-02, 80 y.o.   MRN: [id number]  HPI Pt returns for f/u of diabetes mellitus: DM type: 2 Dx'ed: 6789 Complications: none.  Therapy: 4 oral meds.  GDM: never DKA: never Severe hypoglycemia: never.   Pancreatitis: never. Other: she stopped bromocriptine (hypoglycemia), invokana (dizziness), Farxiga (hypotension), or pioglitazone (edema).  She agreed to learn about insulin, just in case, but she could not go Interval history: she brings a record of her cbg's which I have reviewed today.  cbg varies from 137-224.  she says she takes meds as rx'ed.   pt states she feels well in general, except for fatigue.   Past Medical History:  Diagnosis Date   Arthritis    Depression    Diabetes mellitus without complication ([place])    Hypertension     Past Surgical History:  Procedure Laterality Date   ABDOMINAL HYSTERECTOMY     BACK SURGERY  2006   EXCISION MASS LOWER EXTREMETIES Left 07/11/2019   Procedure: EXCISION MASS foot;  Surgeon: Netta Cedars, MD;  Location: [facility];  Service: Orthopedics;  Laterality: Left;   GALLBLADDER SURGERY  2005   HERNIA REPAIR     KNEE SURGERY  2001/2005    Social History   Socioeconomic History   Marital status: Unknown    Spouse name: Not on file   Number of children: Not on file   Years of education: Not on file   Highest education level: Not on file  Occupational History   Not on file  Tobacco Use   Smoking status: Former Smoker    Packs/day: 0.05    Years: 3.00    Pack years: 0.15    Types: Cigarettes    Quit date: 51    Years since quitting: 42.1   Smokeless tobacco: Never Used  Scientific laboratory technician Use: Never used  Substance and Sexual Activity   Alcohol use: Not Currently   Drug use: Never   Sexual activity: Not Currently  Other Topics Concern   Not on file  Social History Narrative   Not on file   Social Determinants of  Health   Financial Resource Strain: Not on file  Food Insecurity: Not on file  Transportation Needs: Not on file  Physical Activity: Not on file  Stress: Not on file  Social Connections: Not on file  Intimate Partner Violence: Not on file    Current Outpatient Medications on File Prior to Visit  Medication Sig Dispense Refill   acarbose (PRECOSE) 25 MG tablet TAKE 1 TABLET BY MOUTH 3 TIMES A DAY WITH MEALS 270 tablet 2   Accu-Chek FastClix Lancets MISC 1 each by Does not apply route daily. E11.9 90 each 3   ACCU-CHEK GUIDE test strip USE TO TEST BLOOD GLUCOSE 2 TIMES DAILY, AS INSTRUCTED. DX CODE: 250.00 200 strip 2   acetaminophen (TYLENOL) 325 MG tablet Take 650 mg by mouth every 6 (six) hours as needed for pain.     ALPRAZolam (XANAX) 1 MG tablet Take 1 mg by mouth at bedtime as needed for sleep.     CRESTOR 5 MG tablet Take 5 mg by mouth daily.     diclofenac sodium (VOLTAREN) 1 % GEL Apply 4 g topically 4 (four) times daily.     flurbiprofen (ANSAID) 100 MG tablet      gabapentin (NEURONTIN) 300 MG capsule Take 300 mg by mouth 3 (three) times daily.  metFORMIN (GLUCOPHAGE) 500 MG tablet TAKE 1 TABLET (500 MG TOTAL) BY MOUTH 2 (TWO) TIMES DAILY WITH A MEAL. 180 tablet 3   methocarbamol (ROBAXIN) 500 MG tablet Take 500 mg by mouth 3 (three) times daily.     oxyCODONE-acetaminophen (PERCOCET/ROXICET) 5-325 MG tablet Take 2 tablets by mouth every 4 (four) hours as needed for severe pain. 8 tablet 0   promethazine (PHENERGAN) 25 MG tablet Take 25 mg by mouth every 6 (six) hours as needed for nausea or vomiting.     repaglinide (PRANDIN) 2 MG tablet TAKE 1 TABLET (2 MG TOTAL) BY MOUTH 3 (THREE) TIMES DAILY BEFORE MEALS. 270 tablet 3   RYBELSUS 3 MG TABS TAKE 1 TABLET BY MOUTH EVERY DAY BEFORE BREAKFAST 90 tablet 0   traMADol (ULTRAM) 50 MG tablet Take 50 mg by mouth every 6 (six) hours as needed for pain.     Vitamin D, Ergocalciferol, (DRISDOL) 50000 UNITS CAPS  capsule Take 50,000 Units by mouth every 7 (seven) days.     atenolol (TENORMIN) 25 MG tablet Take 1 tablet (25 mg total) by mouth daily. (Patient not taking: Reported on 01/24/2021) 90 tablet 3   enalapril (VASOTEC) 10 MG tablet enalapril maleate 10 mg tablet (Patient not taking: Reported on 01/24/2021)     hydrochlorothiazide (MICROZIDE) 12.5 MG capsule Take 1 capsule (12.5 mg total) by mouth daily. (Patient not taking: Reported on 01/24/2021) 90 capsule 3   No current facility-administered medications on file prior to visit.    Allergies  Allergen Reactions   Aspirin Other (See Comments)    Pt states her PCP told her not to take aspirin but she can not remember exactly why.   Codeine Nausea And Vomiting   Hydrocodone Nausea And Vomiting   Morphine And Related Nausea And Vomiting   Darvon [Propoxyphene] Nausea And Vomiting    Family History  Problem Relation Age of Onset   Arthritis Mother    Hyperlipidemia Mother    Heart disease Mother    Hypertension Mother    Diabetes Mother    Arthritis Father    Hyperlipidemia Father    Heart disease Father    Hypertension Father    Diabetes Father    Alcohol abuse Sister    Hypertension Sister    Cancer Sister     BP 126/78 (BP Location: Right Arm, Patient Position: Sitting, Cuff Size: Normal)    Pulse 99    Ht 5\' 1"  (1.549 m)    Wt 170 lb 6.4 oz (77.3 kg) Comment: With foot boot   SpO2 97%    BMI 32.20 kg/m    Review of Systems She denies hypoglycemia    Objective:   Physical Exam VITAL SIGNS:  See vs page GENERAL: no distress Pulses: left dorsalis pedis is intact.   MSK: no deformity of the left foot  CV: 1+ left leg edema Skin:  no ulcer on the left foot.  normal color and temp on the left foot.  Old healed surgical scar at the left lat malleolus.   Neuro: sensation is intact to touch on the left foot Ext: Right leg is in a boot.    outside test results are reviewed: TSH=6  A1c=7.0%      Assessment & Plan:  Hypothyroidism: uncontrolled Type 2 DM: well-controlled   Patient Instructions  I have sent a prescription to your pharmacy, to increase the levothyroxine.   Please continue the same 4 diabetes medications.   Please come back for a follow-up  appointment in 3 months.   check your blood sugar once a day.  vary the time of day when you check, between before the 3 meals, and at bedtime.  also check if you have symptoms of your blood sugar being too high or too low.  please keep a record of the readings and bring it to your next appointment here (or you can bring the meter itself).  You can write it on any piece of paper.  please call us sooner if your blood sugar goes below 70, or if you have a lot of readings over 200.

## 2021-01-24 NOTE — Patient Instructions (Addendum)
I have sent a prescription to your pharmacy, to increase the levothyroxine.   Please continue the same 4 diabetes medications.   Please come back for a follow-up appointment in 3 months.   check your blood sugar once a day.  vary the time of day when you check, between before the 3 meals, and at bedtime.  also check if you have symptoms of your blood sugar being too high or too low.  please keep a record of the readings and bring it to your next appointment here (or you can bring the meter itself).  You can write it on any piece of paper.  please call us sooner if your blood sugar goes below 70, or if you have a lot of readings over 200.

## 2021-01-25 DIAGNOSIS — E039 Hypothyroidism, unspecified: Secondary | ICD-10-CM | POA: Diagnosis not present

## 2021-01-25 DIAGNOSIS — G4700 Insomnia, unspecified: Secondary | ICD-10-CM | POA: Diagnosis not present

## 2021-02-09 DIAGNOSIS — M542 Cervicalgia: Secondary | ICD-10-CM | POA: Diagnosis not present

## 2021-02-12 DIAGNOSIS — M79671 Pain in right foot: Secondary | ICD-10-CM | POA: Diagnosis not present

## 2021-03-09 IMAGING — CR DG FOREARM 2V*L*
2 series · 2 of 2 positions shown · non-contrast
Comparison: None.

CLINICAL DATA: Injury to the tissues of the left forearm x 3 days
ago

EXAM:
LEFT FOREARM - 2 VIEW

[x forearm ap left]
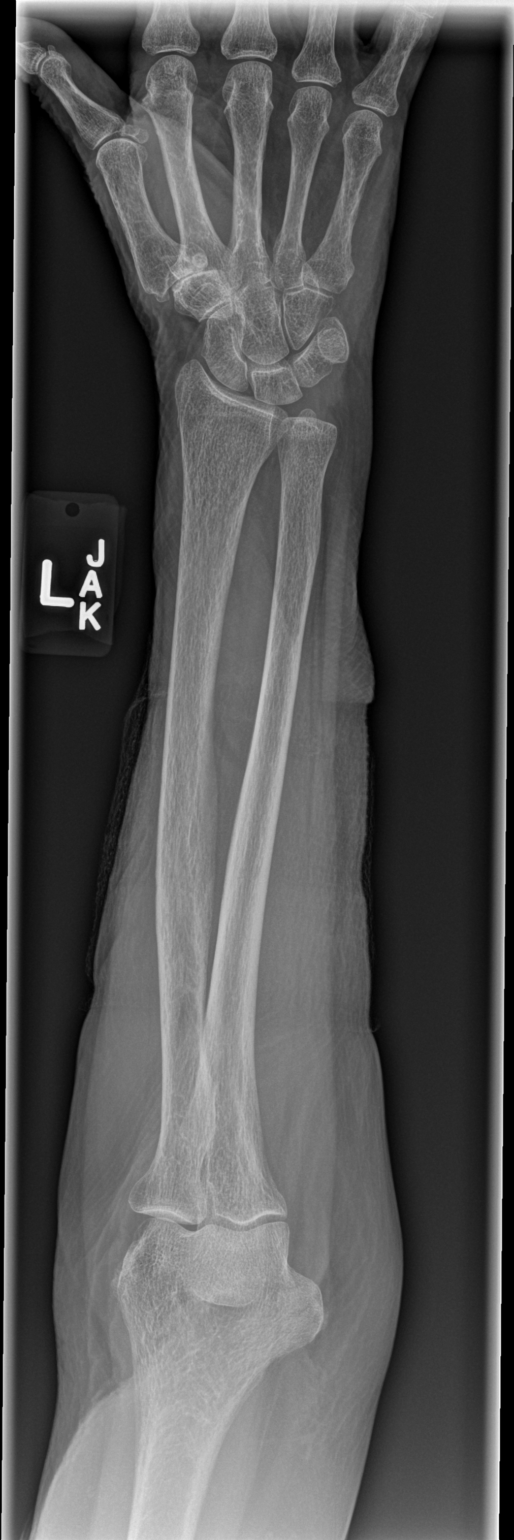

[x forearm lat left]
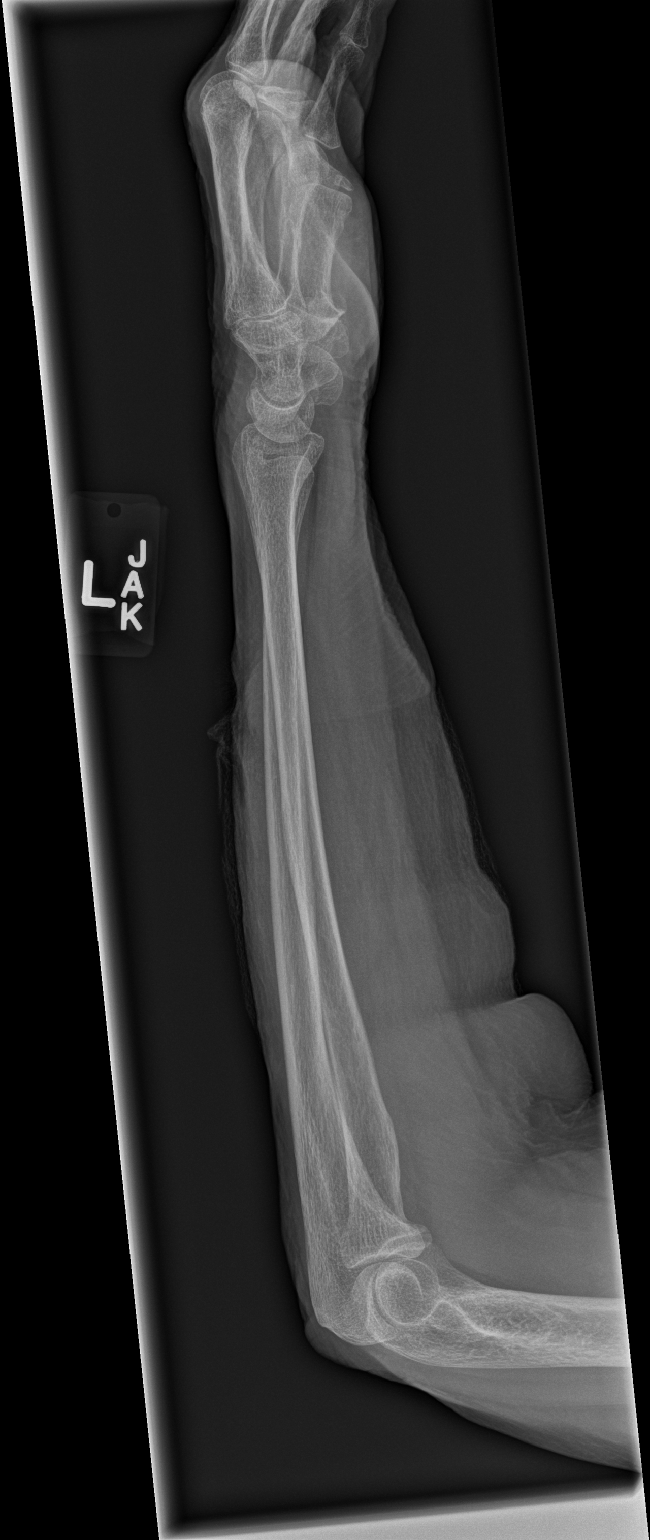

[2 of 2 positions shown; findings below may reference images not displayed]

FINDINGS: Osteopenia. There is no evidence of fracture or other focal bone
lesions. No radiopaque foreign body in the regional soft tissues.
IMPRESSION: No acute osseous abnormality in the left forearm.

## 2021-03-12 DIAGNOSIS — M542 Cervicalgia: Secondary | ICD-10-CM | POA: Diagnosis not present

## 2021-03-18 DIAGNOSIS — S92901D Unspecified fracture of right foot, subsequent encounter for fracture with routine healing: Secondary | ICD-10-CM | POA: Diagnosis not present

## 2021-03-18 DIAGNOSIS — M545 Low back pain, unspecified: Secondary | ICD-10-CM | POA: Diagnosis not present

## 2021-03-18 DIAGNOSIS — M542 Cervicalgia: Secondary | ICD-10-CM | POA: Diagnosis not present

## 2021-03-18 DIAGNOSIS — G4700 Insomnia, unspecified: Secondary | ICD-10-CM | POA: Diagnosis not present

## 2021-04-11 DIAGNOSIS — M542 Cervicalgia: Secondary | ICD-10-CM | POA: Diagnosis not present

## 2021-04-16 DIAGNOSIS — M79671 Pain in right foot: Secondary | ICD-10-CM | POA: Diagnosis not present

## 2021-04-16 DIAGNOSIS — M542 Cervicalgia: Secondary | ICD-10-CM | POA: Diagnosis not present

## 2021-04-25 ENCOUNTER — Other Ambulatory Visit: Payer: Self-pay

## 2021-04-25 ENCOUNTER — Ambulatory Visit (INDEPENDENT_AMBULATORY_CARE_PROVIDER_SITE_OTHER): Payer: Medicare Other | Admitting: Endocrinology

## 2021-04-25 VITALS — BP 134/70 | HR 78 | Ht 61.0 in | Wt 165.4 lb

## 2021-04-25 DIAGNOSIS — E039 Hypothyroidism, unspecified: Secondary | ICD-10-CM | POA: Diagnosis not present

## 2021-04-25 DIAGNOSIS — E119 Type 2 diabetes mellitus without complications: Secondary | ICD-10-CM

## 2021-04-25 LAB — POCT GLYCOSYLATED HEMOGLOBIN (HGB A1C): Hemoglobin A1C: 7 % — AB (ref 4.0–5.6)

## 2021-04-25 LAB — BASIC METABOLIC PANEL
BUN: 19 mg/dL (ref 6–23)
CO2: 27 mEq/L (ref 19–32)
Calcium: 9.6 mg/dL (ref 8.4–10.5)
Chloride: 101 mEq/L (ref 96–112)
Creatinine, Ser: 0.68 mg/dL (ref 0.40–1.20)
GFR: 83.4 mL/min (ref >60.00)
Glucose, Bld: 133 mg/dL — ABNORMAL HIGH (ref 70–99)
Potassium: 4.2 mEq/L (ref 3.5–5.1)
Sodium: 137 mEq/L (ref 135–145)

## 2021-04-25 LAB — T4, FREE: Free T4: 0.89 ng/dL (ref 0.60–1.60)

## 2021-04-25 LAB — TSH: TSH: 2.21 u[IU]/mL (ref 0.35–4.50)

## 2021-04-25 NOTE — Patient Instructions (Addendum)
Blood tests are requested for you today.  We'll let you know about the results.  Please continue the same 4 diabetes medications.   Please come back for a follow-up appointment in 3 months.   check your blood sugar once a day.  vary the time of day when you check, between before the 3 meals, and at bedtime.  also check if you have symptoms of your blood sugar being too high or too low.  please keep a record of the readings and bring it to your next appointment here (or you can bring the meter itself).  You can write it on any piece of paper.  please call us sooner if your blood sugar goes below 70, or if you have a lot of readings over 200.

## 2021-04-25 NOTE — Progress Notes (Signed)
Subjective:    Patient ID: [name], female    DOB: 10/23/1941, 80 y.o.   MRN: [id number]  HPI Pt returns for f/u of diabetes mellitus: DM type: 2 Dx'ed: 3267 Complications: none.  Therapy: 4 oral meds.  GDM: never DKA: never.   Severe hypoglycemia: never.   Pancreatitis: never.   Other: she stopped bromocriptine (hypoglycemia), invokana (dizziness), Farxiga (hypotension), or pioglitazone (edema).  She agreed to learn about insulin, just in case, but she could not go to appt Interval history: she brings a record of her cbg's which I have reviewed today.  cbg varies from 136-263.  she says she takes meds as rx'ed.   pt states she feels well in general, except for fatigue.  Past Medical History:  Diagnosis Date  . Arthritis   . Depression   . Diabetes mellitus without complication ([place])   . Hypertension     Past Surgical History:  Procedure Laterality Date  . ABDOMINAL HYSTERECTOMY    . BACK SURGERY  2006  . EXCISION MASS LOWER EXTREMETIES Left 07/11/2019   Procedure: EXCISION MASS foot;  Surgeon: Netta Cedars, MD;  Location: [facility];  Service: Orthopedics;  Laterality: Left;  . GALLBLADDER SURGERY  2005  . HERNIA REPAIR    . KNEE SURGERY  2001/2005    Social History   Socioeconomic History  . Marital status: Unknown    Spouse name: Not on file  . Number of children: Not on file  . Years of education: Not on file  . Highest education level: Not on file  Occupational History  . Not on file  Tobacco Use  . Smoking status: Former Smoker    Packs/day: 0.05    Years: 3.00    Pack years: 0.15    Types: Cigarettes    Quit date: 1980    Years since quitting: 42.4  . Smokeless tobacco: Never Used  Vaping Use  . Vaping Use: Never used  Substance and Sexual Activity  . Alcohol use: Not Currently  . Drug use: Never  . Sexual activity: Not Currently  Other Topics Concern  . Not on file  Social History Narrative  . Not on file   Social  Determinants of Health   Financial Resource Strain: Not on file  Food Insecurity: Not on file  Transportation Needs: Not on file  Physical Activity: Not on file  Stress: Not on file  Social Connections: Not on file  Intimate Partner Violence: Not on file    Current Outpatient Medications on File Prior to Visit  Medication Sig Dispense Refill  . acarbose (PRECOSE) 25 MG tablet TAKE 1 TABLET BY MOUTH 3 TIMES A DAY WITH MEALS 270 tablet 2  . Accu-Chek FastClix Lancets MISC 1 each by Does not apply route daily. E11.9 90 each 3  . ACCU-CHEK GUIDE test strip USE TO TEST BLOOD GLUCOSE 2 TIMES DAILY, AS INSTRUCTED. DX CODE: 250.00 200 strip 2  . acetaminophen (TYLENOL) 325 MG tablet Take 650 mg by mouth every 6 (six) hours as needed for pain.    Marland Kitchen ALPRAZolam (XANAX) 1 MG tablet Take 1 mg by mouth at bedtime as needed for sleep.    Marland Kitchen atenolol (TENORMIN) 25 MG tablet Take 1 tablet (25 mg total) by mouth daily. 90 tablet 3  . CRESTOR 5 MG tablet Take 5 mg by mouth daily.    . diclofenac sodium (VOLTAREN) 1 % GEL Apply 4 g topically 4 (four) times daily.    . enalapril (  VASOTEC) 10 MG tablet     . flurbiprofen (ANSAID) 100 MG tablet     . gabapentin (NEURONTIN) 300 MG capsule Take 300 mg by mouth 3 (three) times daily.    . hydrochlorothiazide (MICROZIDE) 12.5 MG capsule Take 1 capsule (12.5 mg total) by mouth daily. 90 capsule 3  . levothyroxine (SYNTHROID) 50 MCG tablet Take 1 tablet (50 mcg total) by mouth daily. 90 tablet 3  . metFORMIN (GLUCOPHAGE) 500 MG tablet TAKE 1 TABLET (500 MG TOTAL) BY MOUTH 2 (TWO) TIMES DAILY WITH A MEAL. 180 tablet 3  . methocarbamol (ROBAXIN) 500 MG tablet Take 500 mg by mouth 3 (three) times daily.    Marland Kitchen oxyCODONE-acetaminophen (PERCOCET/ROXICET) 5-325 MG tablet Take 2 tablets by mouth every 4 (four) hours as needed for severe pain. 8 tablet 0  . promethazine (PHENERGAN) 25 MG tablet Take 25 mg by mouth every 6 (six) hours as needed for nausea or vomiting.    .  repaglinide (PRANDIN) 2 MG tablet TAKE 1 TABLET (2 MG TOTAL) BY MOUTH 3 (THREE) TIMES DAILY BEFORE MEALS. 270 tablet 3  . RYBELSUS 3 MG TABS TAKE 1 TABLET BY MOUTH EVERY DAY BEFORE BREAKFAST 90 tablet 0  . traMADol (ULTRAM) 50 MG tablet Take 50 mg by mouth every 6 (six) hours as needed for pain.    . Vitamin D, Ergocalciferol, (DRISDOL) 50000 UNITS CAPS capsule Take 50,000 Units by mouth every 7 (seven) days.     No current facility-administered medications on file prior to visit.    Allergies  Allergen Reactions  . Aspirin Other (See Comments)    Pt states her PCP told her not to take aspirin but she can not remember exactly why.  . Codeine Nausea And Vomiting  . Hydrocodone Nausea And Vomiting  . Morphine And Related Nausea And Vomiting  . Darvon [Propoxyphene] Nausea And Vomiting    Family History  Problem Relation Age of Onset  . Arthritis Mother   . Hyperlipidemia Mother   . Heart disease Mother   . Hypertension Mother   . Diabetes Mother   . Arthritis Father   . Hyperlipidemia Father   . Heart disease Father   . Hypertension Father   . Diabetes Father   . Alcohol abuse Sister   . Hypertension Sister   . Cancer Sister     BP 134/70 (BP Location: Right Arm, Patient Position: Sitting, Cuff Size: Normal)   Pulse 78   Ht 5\' 1"  (1.549 m)   Wt 165 lb 6.4 oz (75 kg)   SpO2 97%   BMI 31.25 kg/m    Review of Systems     Objective:   Physical Exam VITAL SIGNS:  See vs page GENERAL: no distress Pulses: dorsalis pedis intact bilat.   MSK: no deformity of the feet CV: no leg edema.  Skin:  no ulcer on the feet.  normal color and temp on the feet.   Old healed surgical scar at the left lat malleolus.   Neuro: sensation is intact to touch on the feet.    A1c=7.0%     Assessment & Plan:  Type 2 DM: well-controlled.   Hypothyroidism: recheck today.  Patient Instructions  Blood tests are requested for you today.  We'll let you know about the results.  Please  continue the same 4 diabetes medications.   Please come back for a follow-up appointment in 3 months.   check your blood sugar once a day.  vary the time of day when  you check, between before the 3 meals, and at bedtime.  also check if you have symptoms of your blood sugar being too high or too low.  please keep a record of the readings and bring it to your next appointment here (or you can bring the meter itself).  You can write it on any piece of paper.  please call us sooner if your blood sugar goes below 70, or if you have a lot of readings over 200.

## 2021-05-01 ENCOUNTER — Telehealth: Payer: Self-pay | Admitting: Endocrinology

## 2021-05-01 NOTE — Telephone Encounter (Signed)
F/u   Have additional questions to asked the nurse, asking for a call back to discuss

## 2021-05-01 NOTE — Telephone Encounter (Signed)
New message    1. Which medications need to be refilled? (please list name of each medication and dose if known)   RYBELSUS 3 MG TABS  2. Which pharmacy/location (including street and city if local pharmacy) is medication to be sent to?[id number] - [place], [place] - [address] AT [place]  3. Do they need a 30 day or 90 day supply?  90 days supply

## 2021-05-02 NOTE — Telephone Encounter (Signed)
LVM for pt to cb to office with her questions/concerns

## 2021-05-03 NOTE — Telephone Encounter (Signed)
Patient returned call from this AM - also wanted to know if Rybelsus

## 2021-05-06 ENCOUNTER — Telehealth: Payer: Self-pay | Admitting: Endocrinology

## 2021-05-06 DIAGNOSIS — Z7984 Long term (current) use of oral hypoglycemic drugs: Secondary | ICD-10-CM | POA: Diagnosis not present

## 2021-05-06 DIAGNOSIS — K219 Gastro-esophageal reflux disease without esophagitis: Secondary | ICD-10-CM | POA: Diagnosis not present

## 2021-05-06 DIAGNOSIS — E538 Deficiency of other specified B group vitamins: Secondary | ICD-10-CM | POA: Diagnosis not present

## 2021-05-06 DIAGNOSIS — M85852 Other specified disorders of bone density and structure, left thigh: Secondary | ICD-10-CM | POA: Diagnosis not present

## 2021-05-06 DIAGNOSIS — Z832 Family history of diseases of the blood and blood-forming organs and certain disorders involving the immune mechanism: Secondary | ICD-10-CM | POA: Diagnosis not present

## 2021-05-06 DIAGNOSIS — I10 Essential (primary) hypertension: Secondary | ICD-10-CM | POA: Diagnosis not present

## 2021-05-06 DIAGNOSIS — J309 Allergic rhinitis, unspecified: Secondary | ICD-10-CM | POA: Diagnosis not present

## 2021-05-06 DIAGNOSIS — E1165 Type 2 diabetes mellitus with hyperglycemia: Secondary | ICD-10-CM | POA: Diagnosis not present

## 2021-05-06 DIAGNOSIS — E782 Mixed hyperlipidemia: Secondary | ICD-10-CM | POA: Diagnosis not present

## 2021-05-06 DIAGNOSIS — E559 Vitamin D deficiency, unspecified: Secondary | ICD-10-CM | POA: Diagnosis not present

## 2021-05-06 DIAGNOSIS — E119 Type 2 diabetes mellitus without complications: Secondary | ICD-10-CM

## 2021-05-06 DIAGNOSIS — M79671 Pain in right foot: Secondary | ICD-10-CM | POA: Diagnosis not present

## 2021-05-06 NOTE — Telephone Encounter (Signed)
Patient called re: Patient states RX for Rybelsus was sent to wrong PHARM ([address]-Patient states [facility] [address]).  Patient requests a new RX-90 day supply- with refills for  RYBELSUS 3 MG TABS be sent asap to:  [id number] [place], [place] - [place] Phone:  [phone]  Fax:  [phone]

## 2021-05-07 ENCOUNTER — Other Ambulatory Visit: Payer: Self-pay | Admitting: Family Medicine

## 2021-05-07 DIAGNOSIS — M85852 Other specified disorders of bone density and structure, left thigh: Secondary | ICD-10-CM

## 2021-05-08 MED ORDER — RYBELSUS 3 MG PO TABS: ORAL_TABLET | ORAL | 0 refills | Status: DC

## 2021-05-08 NOTE — Telephone Encounter (Signed)
Refill sent as requested. 

## 2021-05-08 NOTE — Addendum Note (Signed)
Addended by: Jacqualin Combes on: 05/08/2021 03:51 PM   Modules accepted: Orders

## 2021-05-12 DIAGNOSIS — M542 Cervicalgia: Secondary | ICD-10-CM | POA: Diagnosis not present

## 2021-05-14 NOTE — Telephone Encounter (Signed)
Called pt. States her issue was already handled.

## 2021-06-05 DIAGNOSIS — M542 Cervicalgia: Secondary | ICD-10-CM | POA: Diagnosis not present

## 2021-06-05 DIAGNOSIS — M79671 Pain in right foot: Secondary | ICD-10-CM | POA: Diagnosis not present

## 2021-06-05 DIAGNOSIS — M47812 Spondylosis without myelopathy or radiculopathy, cervical region: Secondary | ICD-10-CM | POA: Diagnosis not present

## 2021-06-11 DIAGNOSIS — M542 Cervicalgia: Secondary | ICD-10-CM | POA: Diagnosis not present

## 2021-06-13 DIAGNOSIS — D485 Neoplasm of uncertain behavior of skin: Secondary | ICD-10-CM | POA: Diagnosis not present

## 2021-06-13 DIAGNOSIS — C44622 Squamous cell carcinoma of skin of right upper limb, including shoulder: Secondary | ICD-10-CM | POA: Diagnosis not present

## 2021-06-21 ENCOUNTER — Other Ambulatory Visit: Payer: Self-pay | Admitting: Endocrinology

## 2021-06-21 DIAGNOSIS — E119 Type 2 diabetes mellitus without complications: Secondary | ICD-10-CM

## 2021-06-25 DIAGNOSIS — D0461 Carcinoma in situ of skin of right upper limb, including shoulder: Secondary | ICD-10-CM | POA: Diagnosis not present

## 2021-06-25 DIAGNOSIS — L249 Irritant contact dermatitis, unspecified cause: Secondary | ICD-10-CM | POA: Diagnosis not present

## 2021-07-12 ENCOUNTER — Other Ambulatory Visit: Payer: Self-pay | Admitting: Endocrinology

## 2021-07-12 DIAGNOSIS — M542 Cervicalgia: Secondary | ICD-10-CM | POA: Diagnosis not present

## 2021-07-30 ENCOUNTER — Other Ambulatory Visit: Payer: Self-pay

## 2021-07-30 ENCOUNTER — Ambulatory Visit (INDEPENDENT_AMBULATORY_CARE_PROVIDER_SITE_OTHER): Payer: Medicare Other | Admitting: Endocrinology

## 2021-07-30 VITALS — Ht 61.0 in | Wt 169.0 lb

## 2021-07-30 DIAGNOSIS — E119 Type 2 diabetes mellitus without complications: Secondary | ICD-10-CM | POA: Diagnosis not present

## 2021-07-30 MED ORDER — METFORMIN HCL ER 500 MG PO TB24: 1000.0000 mg | ORAL_TABLET | Freq: Every day | ORAL | 3 refills | Status: DC

## 2021-07-30 NOTE — Progress Notes (Signed)
Subjective:    Patient ID: [name], female    DOB: March 13, 1941, 80 y.o.   MRN: [id number]  HPI telehealth visit today via telephone x 9 minutes.  Alternatives to telehealth are presented to this patient, and the patient agrees to the telehealth visit.   Pt is advised of the cost of the visit, and agrees to this, also.   Patient is at home, and I am at the office.   Persons attending the telehealth visit: the patient and I Pt returns for f/u of diabetes mellitus: DM type: 2 Dx'ed: 123456 Complications: none.  Therapy: 4 oral meds.  GDM: never DKA: never.   Severe hypoglycemia: never.   Pancreatitis: never.   Other: she stopped bromocriptine (hypoglycemia), invokana (dizziness), Farxiga (hypotension), or pioglitazone (edema).  She agreed to learn about insulin, just in case, but she could not go to appt Interval history: she brings a record of her cbg's which I have reviewed today.  cbg varies from 130-176.  she says she takes meds as rx'ed.   pt states she feels well in general, except for fatigue.  Past Medical History:  Diagnosis Date   Arthritis    Depression    Diabetes mellitus without complication ([place])    Hypertension     Past Surgical History:  Procedure Laterality Date   ABDOMINAL HYSTERECTOMY     BACK SURGERY  2006   EXCISION MASS LOWER EXTREMETIES Left 07/11/2019   Procedure: EXCISION MASS foot;  Surgeon: Netta Cedars, MD;  Location: [facility];  Service: Orthopedics;  Laterality: Left;   GALLBLADDER SURGERY  2005   HERNIA REPAIR     KNEE SURGERY  2001/2005    Social History   Socioeconomic History   Marital status: Unknown    Spouse name: Not on file   Number of children: Not on file   Years of education: Not on file   Highest education level: Not on file  Occupational History   Not on file  Tobacco Use   Smoking status: Former    Packs/day: 0.05    Years: 3.00    Pack years: 0.15    Types: Cigarettes    Quit date: 50    Years  since quitting: 42.7   Smokeless tobacco: Never  Vaping Use   Vaping Use: Never used  Substance and Sexual Activity   Alcohol use: Not Currently   Drug use: Never   Sexual activity: Not Currently  Other Topics Concern   Not on file  Social History Narrative   Not on file   Social Determinants of Health   Financial Resource Strain: Not on file  Food Insecurity: Not on file  Transportation Needs: Not on file  Physical Activity: Not on file  Stress: Not on file  Social Connections: Not on file  Intimate Partner Violence: Not on file    Current Outpatient Medications on File Prior to Visit  Medication Sig Dispense Refill   acarbose (PRECOSE) 25 MG tablet TAKE 1 TABLET BY MOUTH 3 TIMES A DAY WITH MEALS 270 tablet 2   Accu-Chek FastClix Lancets MISC 1 each by Does not apply route daily. E11.9 90 each 3   ACCU-CHEK GUIDE test strip USE TO TEST BLOOD GLUCOSE 2 TIMES DAILY, AS INSTRUCTED. DX CODE: 250.00 200 strip 2   acetaminophen (TYLENOL) 325 MG tablet Take 650 mg by mouth every 6 (six) hours as needed for pain.     ALPRAZolam (XANAX) 1 MG tablet Take 1 mg by mouth  at bedtime as needed for sleep.     atenolol (TENORMIN) 25 MG tablet Take 1 tablet (25 mg total) by mouth daily. 90 tablet 3   CRESTOR 5 MG tablet Take 5 mg by mouth daily.     diclofenac sodium (VOLTAREN) 1 % GEL Apply 4 g topically 4 (four) times daily.     enalapril (VASOTEC) 10 MG tablet      flurbiprofen (ANSAID) 100 MG tablet      gabapentin (NEURONTIN) 300 MG capsule Take 300 mg by mouth 3 (three) times daily.     hydrochlorothiazide (MICROZIDE) 12.5 MG capsule Take 1 capsule (12.5 mg total) by mouth daily. 90 capsule 3   levothyroxine (SYNTHROID) 50 MCG tablet Take 1 tablet (50 mcg total) by mouth daily. 90 tablet 3   methocarbamol (ROBAXIN) 500 MG tablet Take 500 mg by mouth 3 (three) times daily.     oxyCODONE-acetaminophen (PERCOCET/ROXICET) 5-325 MG tablet Take 2 tablets by mouth every 4 (four) hours as needed  for severe pain. 8 tablet 0   promethazine (PHENERGAN) 25 MG tablet Take 25 mg by mouth every 6 (six) hours as needed for nausea or vomiting.     repaglinide (PRANDIN) 2 MG tablet TAKE 1 TABLET (2 MG TOTAL) BY MOUTH 3 (THREE) TIMES DAILY BEFORE MEALS. 270 tablet 3   Semaglutide (RYBELSUS) 3 MG TABS TAKE 1 TABLET BY MOUTH EVERY DAY BEFORE BREAKFAST 90 tablet 0   traMADol (ULTRAM) 50 MG tablet Take 50 mg by mouth every 6 (six) hours as needed for pain.     Vitamin D, Ergocalciferol, (DRISDOL) 50000 UNITS CAPS capsule Take 50,000 Units by mouth every 7 (seven) days.     No current facility-administered medications on file prior to visit.    Allergies  Allergen Reactions   Aspirin Other (See Comments)    Pt states her PCP told her not to take aspirin but she can not remember exactly why.   Codeine Nausea And Vomiting   Hydrocodone Nausea And Vomiting   Morphine And Related Nausea And Vomiting   Darvon [Propoxyphene] Nausea And Vomiting    Family History  Problem Relation Age of Onset   Arthritis Mother    Hyperlipidemia Mother    Heart disease Mother    Hypertension Mother    Diabetes Mother    Arthritis Father    Hyperlipidemia Father    Heart disease Father    Hypertension Father    Diabetes Father    Alcohol abuse Sister    Hypertension Sister    Cancer Sister     Ht '5\' 1"'$  (1.549 m)   Wt 169 lb (76.7 kg)   BMI 31.93 kg/m    Review of Systems     Objective:   Physical Exam  Lab Results  Component Value Date   TSH 2.21 04/25/2021       Assessment & Plan:  Type 2 DM: well-controlled  Patient Instructions  Please continue the same 4 diabetes medications.   Please come back for a follow-up appointment in 3 months.   check your blood sugar once a day.  vary the time of day when you check, between before the 3 meals, and at bedtime.  also check if you have symptoms of your blood sugar being too high or too low.  please keep a record of the readings and bring it to  your next appointment here (or you can bring the meter itself).  You can write it on any piece of paper.  please call us sooner if your blood sugar goes below 70, or if you have a lot of readings over 200.

## 2021-07-30 NOTE — Patient Instructions (Addendum)
Please continue the same 4 diabetes medications.   Please come back for a follow-up appointment in 3 months.   check your blood sugar once a day.  vary the time of day when you check, between before the 3 meals, and at bedtime.  also check if you have symptoms of your blood sugar being too high or too low.  please keep a record of the readings and bring it to your next appointment here (or you can bring the meter itself).  You can write it on any piece of paper.  please call us sooner if your blood sugar goes below 70, or if you have a lot of readings over 200.

## 2021-08-01 ENCOUNTER — Telehealth: Payer: Self-pay | Admitting: Endocrinology

## 2021-08-01 NOTE — Telephone Encounter (Signed)
VM left for patient to callback regarding Metformin questions

## 2021-08-01 NOTE — Telephone Encounter (Signed)
Patient returned call and requests to be called asap at ph# [phone]. Patient states she is very concerned and does not know what to do and that Dr. Loanne Drilling has always had Patient on Metformin HCL 500 MG 2 tablets per day-new RX is Metformin ER. Patient states she also called after hours on 07/31/21 and left a detailed message with [facility]. Patient states she does not want to take Metformin ER-for now, Patient states she will take Metformin HCL 500-1 tablet -2 x per day. If Patient is unable to answer phone, Patient requests to leave a detailed message of which Metformin Dr. Loanne Drilling wants Patient to take. If Dr. Loanne Drilling wants Patient to stay on Metformin HCL 500-1 tablet -2 x per day, Patient wants to know if Patient's insurance will cover the new RX since Patient already picked up the Metformin ER Rx without realizing the medication chang from HCL to ER.

## 2021-08-01 NOTE — Telephone Encounter (Signed)
Pt has some questions for  metFORMIN (GLUCOPHAGE-XR) 500 MG 24 hr tablet Pt would like a call back regarding this medication that she is taking she has some questions. States she has been taking metformin HCL 500 mg instead of XR. Due to a recall..and not the extended release.  Pt would like a call back as soon as possible at [phone]

## 2021-08-02 ENCOUNTER — Other Ambulatory Visit: Payer: Self-pay | Admitting: Endocrinology

## 2021-08-02 MED ORDER — METFORMIN HCL 500 MG PO TABS
500.0000 mg | ORAL_TABLET | Freq: Two times a day (BID) | ORAL | 3 refills | Status: DC
Start: 2021-08-02 — End: 2021-12-17

## 2021-08-02 NOTE — Telephone Encounter (Signed)
Patient has concerns about the Metformin ER and would like to go back to the one she was on before the change. Please review and advise

## 2021-08-02 NOTE — Telephone Encounter (Signed)
Detail vm left for patient that medication was switched back to Metformin 500 mg .

## 2021-08-05 ENCOUNTER — Other Ambulatory Visit: Payer: Self-pay | Admitting: Endocrinology

## 2021-08-05 DIAGNOSIS — E119 Type 2 diabetes mellitus without complications: Secondary | ICD-10-CM

## 2021-08-07 DIAGNOSIS — M47812 Spondylosis without myelopathy or radiculopathy, cervical region: Secondary | ICD-10-CM | POA: Diagnosis not present

## 2021-08-07 DIAGNOSIS — M79671 Pain in right foot: Secondary | ICD-10-CM | POA: Diagnosis not present

## 2021-08-07 DIAGNOSIS — M542 Cervicalgia: Secondary | ICD-10-CM | POA: Diagnosis not present

## 2021-08-12 DIAGNOSIS — M542 Cervicalgia: Secondary | ICD-10-CM | POA: Diagnosis not present

## 2021-08-30 ENCOUNTER — Emergency Department (HOSPITAL_COMMUNITY): Payer: Medicare Other

## 2021-08-30 ENCOUNTER — Emergency Department (HOSPITAL_COMMUNITY)
Admission: EM | Admit: 2021-08-30 | Discharge: 2021-08-30 | Disposition: A | Discharge status: Home / Self Care | Payer: Medicare Other | Attending: Emergency Medicine | Admitting: Emergency Medicine

## 2021-08-30 ENCOUNTER — Encounter (HOSPITAL_COMMUNITY): Payer: Self-pay

## 2021-08-30 ENCOUNTER — Other Ambulatory Visit: Payer: Self-pay

## 2021-08-30 DIAGNOSIS — Z7984 Long term (current) use of oral hypoglycemic drugs: Secondary | ICD-10-CM | POA: Diagnosis not present

## 2021-08-30 DIAGNOSIS — E119 Type 2 diabetes mellitus without complications: Secondary | ICD-10-CM | POA: Diagnosis not present

## 2021-08-30 DIAGNOSIS — S0990XA Unspecified injury of head, initial encounter: Secondary | ICD-10-CM | POA: Diagnosis not present

## 2021-08-30 DIAGNOSIS — Z743 Need for continuous supervision: Secondary | ICD-10-CM | POA: Diagnosis not present

## 2021-08-30 DIAGNOSIS — I10 Essential (primary) hypertension: Secondary | ICD-10-CM | POA: Diagnosis not present

## 2021-08-30 DIAGNOSIS — S199XXA Unspecified injury of neck, initial encounter: Secondary | ICD-10-CM | POA: Diagnosis not present

## 2021-08-30 DIAGNOSIS — M545 Low back pain, unspecified: Secondary | ICD-10-CM | POA: Insufficient documentation

## 2021-08-30 DIAGNOSIS — Z79899 Other long term (current) drug therapy: Secondary | ICD-10-CM | POA: Diagnosis not present

## 2021-08-30 DIAGNOSIS — M542 Cervicalgia: Secondary | ICD-10-CM | POA: Insufficient documentation

## 2021-08-30 DIAGNOSIS — E039 Hypothyroidism, unspecified: Secondary | ICD-10-CM | POA: Insufficient documentation

## 2021-08-30 DIAGNOSIS — R739 Hyperglycemia, unspecified: Secondary | ICD-10-CM | POA: Diagnosis not present

## 2021-08-30 DIAGNOSIS — Z87891 Personal history of nicotine dependence: Secondary | ICD-10-CM | POA: Diagnosis not present

## 2021-08-30 DIAGNOSIS — R6889 Other general symptoms and signs: Secondary | ICD-10-CM | POA: Diagnosis not present

## 2021-08-30 DIAGNOSIS — M546 Pain in thoracic spine: Secondary | ICD-10-CM | POA: Diagnosis not present

## 2021-08-30 DIAGNOSIS — Y92481 Parking lot as the place of occurrence of the external cause: Secondary | ICD-10-CM | POA: Insufficient documentation

## 2021-08-30 MED ORDER — LORAZEPAM 0.5 MG PO TABS
0.5000 mg | ORAL_TABLET | Freq: Once | ORAL | Status: AC
  Administered 2021-08-30: 0.5 mg via ORAL
  Filled 2021-08-30: qty 1

## 2021-08-30 MED ORDER — METHOCARBAMOL 500 MG PO TABS
500.0000 mg | ORAL_TABLET | Freq: Once | ORAL | Status: AC
  Administered 2021-08-30: 500 mg via ORAL
  Filled 2021-08-30: qty 1

## 2021-08-30 MED ORDER — ACETAMINOPHEN 500 MG PO TABS
1000.0000 mg | ORAL_TABLET | Freq: Once | ORAL | Status: AC
  Administered 2021-08-30: 1000 mg via ORAL
  Filled 2021-08-30: qty 2
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
## 2021-09-03 DIAGNOSIS — Z23 Encounter for immunization: Secondary | ICD-10-CM | POA: Diagnosis not present

## 2021-09-03 DIAGNOSIS — J309 Allergic rhinitis, unspecified: Secondary | ICD-10-CM | POA: Diagnosis not present

## 2021-09-03 DIAGNOSIS — I10 Essential (primary) hypertension: Secondary | ICD-10-CM | POA: Diagnosis not present

## 2021-09-03 DIAGNOSIS — S134XXA Sprain of ligaments of cervical spine, initial encounter: Secondary | ICD-10-CM | POA: Diagnosis not present

## 2021-09-03 DIAGNOSIS — E041 Nontoxic single thyroid nodule: Secondary | ICD-10-CM | POA: Diagnosis not present

## 2021-09-03 DIAGNOSIS — R42 Dizziness and giddiness: Secondary | ICD-10-CM | POA: Diagnosis not present

## 2021-09-03 DIAGNOSIS — M542 Cervicalgia: Secondary | ICD-10-CM | POA: Diagnosis not present

## 2021-09-04 ENCOUNTER — Other Ambulatory Visit: Payer: Self-pay | Admitting: Family Medicine

## 2021-09-04 DIAGNOSIS — L814 Other melanin hyperpigmentation: Secondary | ICD-10-CM | POA: Diagnosis not present

## 2021-09-04 DIAGNOSIS — L249 Irritant contact dermatitis, unspecified cause: Secondary | ICD-10-CM | POA: Diagnosis not present

## 2021-09-04 DIAGNOSIS — D225 Melanocytic nevi of trunk: Secondary | ICD-10-CM | POA: Diagnosis not present

## 2021-09-04 DIAGNOSIS — L538 Other specified erythematous conditions: Secondary | ICD-10-CM | POA: Diagnosis not present

## 2021-09-04 DIAGNOSIS — L298 Other pruritus: Secondary | ICD-10-CM | POA: Diagnosis not present

## 2021-09-04 DIAGNOSIS — L218 Other seborrheic dermatitis: Secondary | ICD-10-CM | POA: Diagnosis not present

## 2021-09-04 DIAGNOSIS — L820 Inflamed seborrheic keratosis: Secondary | ICD-10-CM | POA: Diagnosis not present

## 2021-09-04 DIAGNOSIS — D0461 Carcinoma in situ of skin of right upper limb, including shoulder: Secondary | ICD-10-CM | POA: Diagnosis not present

## 2021-09-04 DIAGNOSIS — L570 Actinic keratosis: Secondary | ICD-10-CM | POA: Diagnosis not present

## 2021-09-04 DIAGNOSIS — L821 Other seborrheic keratosis: Secondary | ICD-10-CM | POA: Diagnosis not present

## 2021-09-04 DIAGNOSIS — E041 Nontoxic single thyroid nodule: Secondary | ICD-10-CM

## 2021-09-06 ENCOUNTER — Ambulatory Visit
Admission: RE | Admit: 2021-09-06 | Discharge: 2021-09-06 | Disposition: A | Discharge status: Home / Self Care | Payer: Self-pay | Source: Ambulatory Visit | Attending: Family Medicine | Admitting: Family Medicine

## 2021-09-06 DIAGNOSIS — E041 Nontoxic single thyroid nodule: Secondary | ICD-10-CM

## 2021-09-09 ENCOUNTER — Other Ambulatory Visit: Payer: Self-pay | Admitting: Family Medicine

## 2021-09-09 ENCOUNTER — Telehealth: Payer: Self-pay | Admitting: Endocrinology

## 2021-09-09 DIAGNOSIS — E041 Nontoxic single thyroid nodule: Secondary | ICD-10-CM
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
## 2021-09-09 NOTE — Telephone Encounter (Signed)
Vm left for patient to callback to move up to sooner appt

## 2021-09-10 ENCOUNTER — Other Ambulatory Visit: Payer: Self-pay | Admitting: Family Medicine

## 2021-09-10 ENCOUNTER — Telehealth: Payer: Self-pay

## 2021-09-10 DIAGNOSIS — E041 Nontoxic single thyroid nodule: Secondary | ICD-10-CM

## 2021-09-10 NOTE — Telephone Encounter (Signed)
Called Patient to schedule appointment asap-Patient is not able to come in sooner than her 10/11/21 appointment

## 2021-09-10 NOTE — Telephone Encounter (Signed)
LVM for pt to let her know that ellison said not to change anything dealing with the medication and also to contact the office to schedule an appt dealing with thyroids.

## 2021-09-10 NOTE — Telephone Encounter (Signed)
Call patient back no answer, will try again later

## 2021-09-10 NOTE — Telephone Encounter (Signed)
Patient is aware that no change in medication and Dr. Loanne Drilling is aware that she can't come in any sooner.

## 2021-09-10 NOTE — Telephone Encounter (Signed)
Per Dr. Loanne Drilling it okay for patient to come in on scheduled appointment
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
## 2021-09-11 DIAGNOSIS — M542 Cervicalgia: Secondary | ICD-10-CM | POA: Diagnosis not present

## 2021-09-16 DIAGNOSIS — M542 Cervicalgia: Secondary | ICD-10-CM | POA: Diagnosis not present

## 2021-09-16 DIAGNOSIS — I10 Essential (primary) hypertension: Secondary | ICD-10-CM | POA: Diagnosis not present

## 2021-09-16 DIAGNOSIS — E041 Nontoxic single thyroid nodule: Secondary | ICD-10-CM | POA: Diagnosis not present

## 2021-09-17 DIAGNOSIS — M542 Cervicalgia: Secondary | ICD-10-CM | POA: Diagnosis not present

## 2021-09-17 DIAGNOSIS — S134XXA Sprain of ligaments of cervical spine, initial encounter: Secondary | ICD-10-CM | POA: Diagnosis not present

## 2021-10-01 DIAGNOSIS — M5416 Radiculopathy, lumbar region: Secondary | ICD-10-CM | POA: Diagnosis not present

## 2021-10-02 ENCOUNTER — Ambulatory Visit
Admission: RE | Admit: 2021-10-02 | Discharge: 2021-10-02 | Disposition: A | Discharge status: Home / Self Care | Payer: Medicare Other | Source: Ambulatory Visit | Attending: Family Medicine | Admitting: Family Medicine

## 2021-10-02 ENCOUNTER — Other Ambulatory Visit (HOSPITAL_COMMUNITY)
Admission: RE | Admit: 2021-10-02 | Discharge: 2021-10-02 | Disposition: A | Discharge status: Home / Self Care | Payer: Medicare Other | Source: Ambulatory Visit | Attending: Family Medicine | Admitting: Family Medicine

## 2021-10-02 DIAGNOSIS — E041 Nontoxic single thyroid nodule: Secondary | ICD-10-CM

## 2021-10-02 DIAGNOSIS — E079 Disorder of thyroid, unspecified: Secondary | ICD-10-CM | POA: Insufficient documentation

## 2021-10-02 DIAGNOSIS — E042 Nontoxic multinodular goiter: Secondary | ICD-10-CM | POA: Diagnosis not present

## 2021-10-05 LAB — CYTOLOGY - NON PAP

## 2021-10-08 ENCOUNTER — Ambulatory Visit: Payer: Medicare Other | Admitting: Endocrinology

## 2021-10-11 ENCOUNTER — Other Ambulatory Visit: Payer: Self-pay

## 2021-10-11 ENCOUNTER — Ambulatory Visit (INDEPENDENT_AMBULATORY_CARE_PROVIDER_SITE_OTHER): Payer: Medicare Other | Admitting: Endocrinology

## 2021-10-11 VITALS — BP 130/90 | HR 97 | Ht 61.0 in | Wt 174.8 lb

## 2021-10-11 DIAGNOSIS — E041 Nontoxic single thyroid nodule: Secondary | ICD-10-CM | POA: Diagnosis not present

## 2021-10-11 DIAGNOSIS — E119 Type 2 diabetes mellitus without complications: Secondary | ICD-10-CM

## 2021-10-11 DIAGNOSIS — M542 Cervicalgia: Secondary | ICD-10-CM | POA: Diagnosis not present

## 2021-10-11 LAB — POCT GLYCOSYLATED HEMOGLOBIN (HGB A1C): Hemoglobin A1C: 8.3 % — AB (ref 4.0–5.6)

## 2021-10-11 MED ORDER — RYBELSUS 7 MG PO TABS: 7.0000 mg | ORAL_TABLET | Freq: Every day | ORAL | 3 refills | Status: DC

## 2021-10-11 NOTE — Patient Instructions (Addendum)
I have sent a prescription to your pharmacy, to increase the Rybelsus.   Please continue the same other diabetes medications.   Please come back for a follow-up appointment in 2 months.   I'll let you know when I get the biopsy results.   check your blood sugar once a day.  vary the time of day when you check, between before the 3 meals, and at bedtime.  also check if you have symptoms of your blood sugar being too high or too low.  please keep a record of the readings and bring it to your next appointment here (or you can bring the meter itself).  You can write it on any piece of paper.  please call us sooner if your blood sugar goes below 70, or if you have a lot of readings over 200.
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
## 2021-10-15 DIAGNOSIS — M542 Cervicalgia: Secondary | ICD-10-CM | POA: Diagnosis not present

## 2021-10-15 DIAGNOSIS — M7542 Impingement syndrome of left shoulder: Secondary | ICD-10-CM | POA: Diagnosis not present

## 2021-10-21 ENCOUNTER — Telehealth: Payer: Self-pay | Admitting: Endocrinology

## 2021-10-21 NOTE — Telephone Encounter (Signed)
please contact patient: I got results.  Low risk of cancer--good.  We'll recheck the [place] in the future.  I'll see you next time.

## 2021-10-21 NOTE — Telephone Encounter (Signed)
LVM for pt regarding lab results/recommendations  

## 2021-10-22 ENCOUNTER — Encounter (HOSPITAL_COMMUNITY): Payer: Self-pay

## 2021-10-29 ENCOUNTER — Telehealth: Payer: Self-pay

## 2021-10-29 NOTE — Telephone Encounter (Signed)
[name] would like to talk to Dr. Loanne Drilling about her continued thyroid pain & hoarseness.

## 2021-10-31 NOTE — Telephone Encounter (Signed)
Patient called back today. She would like a call back.

## 2021-11-01 ENCOUNTER — Other Ambulatory Visit: Payer: Self-pay | Admitting: Endocrinology

## 2021-11-01 DIAGNOSIS — E042 Nontoxic multinodular goiter: Secondary | ICD-10-CM

## 2021-11-01 NOTE — Telephone Encounter (Signed)
LVM for pt to let her know that a referral has been placed for her to see an ear-nose-throat specialist.  you will receive a phone call, about a day and time for an appointment

## 2021-11-01 NOTE — Telephone Encounter (Signed)
[name] has called 4 times this week. Patient is requesting to talk to someone about her current thyroid problem. Patient stated it is causing hoarseness and discomfort.    Call back phone [phone] or [phone], [phone].

## 2021-11-01 NOTE — Telephone Encounter (Signed)
Spoke with pt and she would like for you to give her a call on Monday 11/04/2021 to discuss thyroid results and about some discomfort, and hoarseness that she is experiencing and what is the plan to getting thru this. She stated that Dr. Annette Stable sent over the report for you to review from [place].

## 2021-11-19 DIAGNOSIS — M25512 Pain in left shoulder: Secondary | ICD-10-CM | POA: Diagnosis not present

## 2021-11-19 DIAGNOSIS — M542 Cervicalgia: Secondary | ICD-10-CM | POA: Diagnosis not present

## 2021-12-05 DIAGNOSIS — M25512 Pain in left shoulder: Secondary | ICD-10-CM | POA: Diagnosis not present

## 2021-12-09 ENCOUNTER — Other Ambulatory Visit: Payer: Self-pay | Admitting: Endocrinology

## 2021-12-10 DIAGNOSIS — R490 Dysphonia: Secondary | ICD-10-CM | POA: Diagnosis not present

## 2021-12-10 DIAGNOSIS — K219 Gastro-esophageal reflux disease without esophagitis: Secondary | ICD-10-CM | POA: Diagnosis not present

## 2021-12-10 DIAGNOSIS — J069 Acute upper respiratory infection, unspecified: Secondary | ICD-10-CM | POA: Diagnosis not present

## 2021-12-10 DIAGNOSIS — E782 Mixed hyperlipidemia: Secondary | ICD-10-CM | POA: Diagnosis not present

## 2021-12-10 DIAGNOSIS — I10 Essential (primary) hypertension: Secondary | ICD-10-CM | POA: Diagnosis not present

## 2021-12-10 DIAGNOSIS — M85852 Other specified disorders of bone density and structure, left thigh: Secondary | ICD-10-CM | POA: Diagnosis not present

## 2021-12-10 DIAGNOSIS — Z7984 Long term (current) use of oral hypoglycemic drugs: Secondary | ICD-10-CM | POA: Diagnosis not present

## 2021-12-10 DIAGNOSIS — E039 Hypothyroidism, unspecified: Secondary | ICD-10-CM | POA: Diagnosis not present

## 2021-12-10 DIAGNOSIS — E538 Deficiency of other specified B group vitamins: Secondary | ICD-10-CM | POA: Diagnosis not present

## 2021-12-10 DIAGNOSIS — E1165 Type 2 diabetes mellitus with hyperglycemia: Secondary | ICD-10-CM | POA: Diagnosis not present

## 2021-12-10 DIAGNOSIS — E559 Vitamin D deficiency, unspecified: Secondary | ICD-10-CM | POA: Diagnosis not present

## 2021-12-11 DIAGNOSIS — M75122 Complete rotator cuff tear or rupture of left shoulder, not specified as traumatic: Secondary | ICD-10-CM | POA: Diagnosis not present

## 2021-12-11 DIAGNOSIS — M25512 Pain in left shoulder: Secondary | ICD-10-CM | POA: Diagnosis not present

## 2021-12-17 ENCOUNTER — Other Ambulatory Visit: Payer: Self-pay

## 2021-12-17 ENCOUNTER — Ambulatory Visit (INDEPENDENT_AMBULATORY_CARE_PROVIDER_SITE_OTHER): Payer: Medicare Other | Admitting: Endocrinology

## 2021-12-17 VITALS — BP 178/100 | HR 96 | Ht 61.0 in | Wt 182.2 lb

## 2021-12-17 DIAGNOSIS — E119 Type 2 diabetes mellitus without complications: Secondary | ICD-10-CM | POA: Diagnosis not present

## 2021-12-17 DIAGNOSIS — E042 Nontoxic multinodular goiter: Secondary | ICD-10-CM | POA: Diagnosis not present

## 2021-12-17 LAB — POCT GLYCOSYLATED HEMOGLOBIN (HGB A1C): Hemoglobin A1C: 9.6 % — AB (ref 4.0–5.6)

## 2021-12-17 LAB — TSH: TSH: 4.34 u[IU]/mL (ref 0.35–5.50)

## 2021-12-17 LAB — T4, FREE: Free T4: 0.87 ng/dL (ref 0.60–1.60)

## 2021-12-17 MED ORDER — METFORMIN HCL ER 500 MG PO TB24: 2000.0000 mg | ORAL_TABLET | Freq: Every day | ORAL | 3 refills | Status: DC

## 2021-12-17 MED ORDER — RYBELSUS 14 MG PO TABS: 14.0000 mg | ORAL_TABLET | Freq: Every day | ORAL | 3 refills | Status: DC

## 2021-12-17 NOTE — Progress Notes (Signed)
Subjective:    Patient ID: [name], female    DOB: [birth year], 81 y.o.   MRN: [id number]  HPI Pt returns for f/u of diabetes mellitus: DM type: 2 Dx'ed: 2947 Complications: none.  Therapy: 4 oral meds.  GDM: never DKA: never.   Severe hypoglycemia: never.   Pancreatitis: never.   Other: she stopped bromocriptine (hypoglycemia), invokana (dizziness), Farxiga (hypotension), or pioglitazone (edema).  She has learned about insulin.   Interval history: pt says cbg's varies from 75-200.    Last steroid injection was 1 week ago, in the left shoulder.   She also has MNG (bx in 2022 showed 2 nodules, both cat 3, but Afirma was low risk.  She takes synthroid for hypothyroidism since 2022).  She takes synthroid as rx'ed.   Past Medical History:  Diagnosis Date   Arthritis    Depression    Diabetes mellitus without complication ([place])    Hypertension     Past Surgical History:  Procedure Laterality Date   ABDOMINAL HYSTERECTOMY     BACK SURGERY  2006   EXCISION MASS LOWER EXTREMETIES Left 07/11/2019   Procedure: EXCISION MASS foot;  Surgeon: Netta Cedars, MD;  Location: [facility];  Service: Orthopedics;  Laterality: Left;   GALLBLADDER SURGERY  2005   HERNIA REPAIR     KNEE SURGERY  2001/2005    Social History   Socioeconomic History   Marital status: Unknown    Spouse name: Not on file   Number of children: Not on file   Years of education: Not on file   Highest education level: Not on file  Occupational History   Not on file  Tobacco Use   Smoking status: Former    Packs/day: 0.05    Years: 3.00    Pack years: 0.15    Types: Cigarettes    Quit date: 63    Years since quitting: 43.0   Smokeless tobacco: Never  Vaping Use   Vaping Use: Never used  Substance and Sexual Activity   Alcohol use: Never   Drug use: Never   Sexual activity: Not Currently  Other Topics Concern   Not on file  Social History Narrative   ** Merged History Encounter  **       Social Determinants of Health   Financial Resource Strain: Not on file  Food Insecurity: Not on file  Transportation Needs: Not on file  Physical Activity: Not on file  Stress: Not on file  Social Connections: Not on file  Intimate Partner Violence: Not on file    Current Outpatient Medications on File Prior to Visit  Medication Sig Dispense Refill   acarbose (PRECOSE) 25 MG tablet TAKE 1 TABLET BY MOUTH 3 TIMES A DAY WITH MEALS 270 tablet 2   Accu-Chek FastClix Lancets MISC 1 each by Does not apply route daily. E11.9 90 each 3   ACCU-CHEK GUIDE test strip USE TO TEST BLOOD GLUCOSE 2 TIMES DAILY, AS INSTRUCTED. DX CODE: 250.00 200 strip 2   acetaminophen (TYLENOL) 325 MG tablet Take 650 mg by mouth every 6 (six) hours as needed for pain.     ALPRAZolam (XANAX) 1 MG tablet Take 1 mg by mouth at bedtime as needed for sleep.     atenolol (TENORMIN) 25 MG tablet Take 1 tablet (25 mg total) by mouth daily. 90 tablet 3   CRESTOR 5 MG tablet Take 5 mg by mouth daily.     diclofenac sodium (VOLTAREN) 1 % GEL Apply  4 g topically 4 (four) times daily.     enalapril (VASOTEC) 10 MG tablet      flurbiprofen (ANSAID) 100 MG tablet      gabapentin (NEURONTIN) 300 MG capsule Take 300 mg by mouth 3 (three) times daily.     hydrochlorothiazide (MICROZIDE) 12.5 MG capsule Take 1 capsule (12.5 mg total) by mouth daily. 90 capsule 3   levothyroxine (SYNTHROID) 50 MCG tablet Take 1 tablet (50 mcg total) by mouth daily. 90 tablet 3   methocarbamol (ROBAXIN) 500 MG tablet Take 500 mg by mouth 3 (three) times daily.     oxyCODONE-acetaminophen (PERCOCET/ROXICET) 5-325 MG tablet Take 2 tablets by mouth every 4 (four) hours as needed for severe pain. 8 tablet 0   promethazine (PHENERGAN) 25 MG tablet Take 25 mg by mouth every 6 (six) hours as needed for nausea or vomiting.     repaglinide (PRANDIN) 2 MG tablet TAKE 1 TABLET (2 MG TOTAL) BY MOUTH 3 (THREE) TIMES DAILY BEFORE MEALS. 270 tablet 3    traMADol (ULTRAM) 50 MG tablet Take 50 mg by mouth every 6 (six) hours as needed for pain.     Vitamin D, Ergocalciferol, (DRISDOL) 50000 UNITS CAPS capsule Take 50,000 Units by mouth every 7 (seven) days.     No current facility-administered medications on file prior to visit.    Allergies  Allergen Reactions   Aspirin Other (See Comments)    Pt states her PCP told her not to take aspirin but she can not remember exactly why.   Codeine Nausea And Vomiting   Hydrocodone Nausea And Vomiting   Morphine And Related Nausea And Vomiting   Darvon [Propoxyphene] Nausea And Vomiting    Family History  Problem Relation Age of Onset   Arthritis Mother    Hyperlipidemia Mother    Heart disease Mother    Hypertension Mother    Diabetes Mother    Arthritis Father    Hyperlipidemia Father    Heart disease Father    Hypertension Father    Diabetes Father    Alcohol abuse Sister    Hypertension Sister    Cancer Sister     BP (!) 178/100    Pulse 96    Ht 5\' 1"  (1.549 m)    Wt 182 lb 3.2 oz (82.6 kg)    SpO2 98%    BMI 34.43 kg/m    Review of Systems     Objective:   Physical Exam EXT: 1+ leg edema.    Lab Results  Component Value Date   CREATININE 0.68 04/25/2021   BUN 19 04/25/2021   NA 137 04/25/2021   K 4.2 04/25/2021   CL 101 04/25/2021   CO2 27 04/25/2021     Lab Results  Component Value Date   HGBA1C 9.6 (A) 12/17/2021   Lab Results  Component Value Date   TSH 4.34 12/17/2021      Assessment & Plan:  Type 2 DM: uncontrolled.  She refuses insulin.  I advised her of risks. Hypothyroidism: well-controlled.  Please continue the same synthroid

## 2021-12-17 NOTE — Patient Instructions (Addendum)
I have sent 2 prescriptions to your pharmacy, to increase the Rybelsus and the metformin.     Please continue the same other diabetes medications.   Please come back for a follow-up appointment in 2 months.   check your blood sugar once a day.  vary the time of day when you check, between before the 3 meals, and at bedtime.  also check if you have symptoms of your blood sugar being too high or too low.  please keep a record of the readings and bring it to your next appointment here (or you can bring the meter itself).  You can write it on any piece of paper.  please call us sooner if your blood sugar goes below 70, or if you have a lot of readings over 200.

## 2021-12-24 ENCOUNTER — Telehealth: Payer: Self-pay | Admitting: Endocrinology

## 2021-12-24 NOTE — Telephone Encounter (Signed)
Patient called want to speak to someone concerning her lab results. She can be reached at [phone].

## 2021-12-25 ENCOUNTER — Telehealth: Payer: Self-pay

## 2021-12-25 NOTE — Telephone Encounter (Signed)
Spoke with pt regarding lab results and recommendations

## 2021-12-25 NOTE — Telephone Encounter (Signed)
Attempted to contact the patient regarding lab result note left on 1/17, LVM for a call back.

## 2022-01-14 DIAGNOSIS — M75122 Complete rotator cuff tear or rupture of left shoulder, not specified as traumatic: Secondary | ICD-10-CM | POA: Diagnosis not present

## 2022-01-21 ENCOUNTER — Other Ambulatory Visit: Payer: Self-pay | Admitting: Endocrinology

## 2022-02-09 DIAGNOSIS — M542 Cervicalgia: Secondary | ICD-10-CM | POA: Diagnosis not present

## 2022-02-11 DIAGNOSIS — E559 Vitamin D deficiency, unspecified: Secondary | ICD-10-CM | POA: Diagnosis not present

## 2022-02-11 DIAGNOSIS — M79671 Pain in right foot: Secondary | ICD-10-CM | POA: Diagnosis not present

## 2022-02-11 DIAGNOSIS — J309 Allergic rhinitis, unspecified: Secondary | ICD-10-CM | POA: Diagnosis not present

## 2022-02-11 DIAGNOSIS — Z1211 Encounter for screening for malignant neoplasm of colon: Secondary | ICD-10-CM | POA: Diagnosis not present

## 2022-02-11 DIAGNOSIS — E1165 Type 2 diabetes mellitus with hyperglycemia: Secondary | ICD-10-CM | POA: Diagnosis not present

## 2022-02-11 DIAGNOSIS — K219 Gastro-esophageal reflux disease without esophagitis: Secondary | ICD-10-CM | POA: Diagnosis not present

## 2022-02-11 DIAGNOSIS — Z0000 Encounter for general adult medical examination without abnormal findings: Secondary | ICD-10-CM | POA: Diagnosis not present

## 2022-02-11 DIAGNOSIS — Z1389 Encounter for screening for other disorder: Secondary | ICD-10-CM | POA: Diagnosis not present

## 2022-02-11 DIAGNOSIS — E782 Mixed hyperlipidemia: Secondary | ICD-10-CM | POA: Diagnosis not present

## 2022-02-11 DIAGNOSIS — I10 Essential (primary) hypertension: Secondary | ICD-10-CM | POA: Diagnosis not present

## 2022-02-11 DIAGNOSIS — M85852 Other specified disorders of bone density and structure, left thigh: Secondary | ICD-10-CM | POA: Diagnosis not present

## 2022-02-13 ENCOUNTER — Other Ambulatory Visit: Payer: Self-pay | Admitting: Family Medicine

## 2022-02-18 ENCOUNTER — Other Ambulatory Visit: Payer: Self-pay

## 2022-02-18 ENCOUNTER — Encounter: Payer: Self-pay | Admitting: Endocrinology

## 2022-02-18 ENCOUNTER — Ambulatory Visit (INDEPENDENT_AMBULATORY_CARE_PROVIDER_SITE_OTHER): Payer: Medicare Other | Admitting: Endocrinology

## 2022-02-18 VITALS — BP 128/72 | HR 100 | Ht 61.0 in | Wt 181.0 lb

## 2022-02-18 DIAGNOSIS — E119 Type 2 diabetes mellitus without complications: Secondary | ICD-10-CM | POA: Diagnosis not present

## 2022-02-18 LAB — POCT GLYCOSYLATED HEMOGLOBIN (HGB A1C): Hemoglobin A1C: 7.9 % — AB (ref 4.0–5.6)

## 2022-02-18 MED ORDER — RYBELSUS 14 MG PO TABS: 14.0000 mg | ORAL_TABLET | Freq: Every day | ORAL | 3 refills | Status: DC

## 2022-02-18 MED ORDER — LEVOTHYROXINE SODIUM 50 MCG PO TABS: 50.0000 ug | ORAL_TABLET | Freq: Every day | ORAL | 3 refills | Status: DC

## 2022-02-18 MED ORDER — ACARBOSE 25 MG PO TABS: ORAL_TABLET | ORAL | 2 refills | Status: DC

## 2022-02-18 MED ORDER — REPAGLINIDE 2 MG PO TABS
2.0000 mg | ORAL_TABLET | Freq: Three times a day (TID) | ORAL | 3 refills | Status: DC
Start: 2022-02-18 — End: 2022-08-12

## 2022-02-18 NOTE — Progress Notes (Signed)
? ?Subjective:  ? ? Patient ID: [name], female    DOB: 1940-12-16, 81 y.o.   MRN: [id number] ? ?HPI ?Pt returns for f/u of diabetes mellitus: ?DM type: 2 ?Dx'ed: 1993 ?Complications: none.  ?Therapy: 4 oral meds.  ?GDM: never ?DKA: never.   ?Severe hypoglycemia: never.    ?Pancreatitis: never.   ?Other: she stopped bromocriptine (hypoglycemia), invokana (dizziness), Farxiga (hypotension), or pioglitazone (edema).  She has learned about insulin, just in case.   ?Interval history: pt says cbg's varies from 139-200.  No recent steroids.   ?She also has MNG (she had bx in 2022 of 2 nodules, both cat 3, but Afirma was low risk.  She takes synthroid for hypothyroidism since 2022).  She takes synthroid as rx'ed.   ?Past Medical History:  ?Diagnosis Date  ? Arthritis   ? Depression   ? Diabetes mellitus without complication ([place])   ? Hypertension   ? ? ?Past Surgical History:  ?Procedure Laterality Date  ? ABDOMINAL HYSTERECTOMY    ? BACK SURGERY  2006  ? EXCISION MASS LOWER EXTREMETIES Left 07/11/2019  ? Procedure: EXCISION MASS foot;  Surgeon: Netta Cedars, MD;  Location: [facility];  Service: Orthopedics;  Laterality: Left;  ? GALLBLADDER SURGERY  2005  ? HERNIA REPAIR    ? KNEE SURGERY  2001/2005  ? ? ?Social History  ? ?Socioeconomic History  ? Marital status: Unknown  ?  Spouse name: Not on file  ? Number of children: Not on file  ? Years of education: Not on file  ? Highest education level: Not on file  ?Occupational History  ? Not on file  ?Tobacco Use  ? Smoking status: Former  ?  Packs/day: 0.05  ?  Years: 3.00  ?  Pack years: 0.15  ?  Types: Cigarettes  ?  Quit date: 37  ?  Years since quitting: 43.2  ? Smokeless tobacco: Never  ?Vaping Use  ? Vaping Use: Never used  ?Substance and Sexual Activity  ? Alcohol use: Never  ? Drug use: Never  ? Sexual activity: Not Currently  ?Other Topics Concern  ? Not on file  ?Social History Narrative  ? ** Merged History Encounter **  ?    ? ?Social  Determinants of Health  ? ?Financial Resource Strain: Not on file  ?Food Insecurity: Not on file  ?Transportation Needs: Not on file  ?Physical Activity: Not on file  ?Stress: Not on file  ?Social Connections: Not on file  ?Intimate Partner Violence: Not on file  ? ? ?Current Outpatient Medications on File Prior to Visit  ?Medication Sig Dispense Refill  ? Accu-Chek FastClix Lancets MISC 1 each by Does not apply route daily. E11.9 90 each 3  ? ACCU-CHEK GUIDE test strip USE TO TEST BLOOD GLUCOSE 2 TIMES DAILY, AS INSTRUCTED. DX CODE: 250.00 200 strip 2  ? acetaminophen (TYLENOL) 325 MG tablet Take 650 mg by mouth every 6 (six) hours as needed for pain.    ? ALPRAZolam (XANAX) 1 MG tablet Take 1 mg by mouth at bedtime as needed for sleep.    ? atenolol (TENORMIN) 25 MG tablet Take 1 tablet (25 mg total) by mouth daily. 90 tablet 3  ? CRESTOR 5 MG tablet Take 5 mg by mouth daily.    ? diclofenac sodium (VOLTAREN) 1 % GEL Apply 4 g topically 4 (four) times daily.    ? enalapril (VASOTEC) 10 MG tablet     ? flurbiprofen (ANSAID) 100 MG  tablet     ? gabapentin (NEURONTIN) 300 MG capsule Take 300 mg by mouth 3 (three) times daily.    ? hydrochlorothiazide (MICROZIDE) 12.5 MG capsule Take 1 capsule (12.5 mg total) by mouth daily. 90 capsule 3  ? metFORMIN (GLUCOPHAGE-XR) 500 MG 24 hr tablet Take 4 tablets (2,000 mg total) by mouth daily with breakfast. 360 tablet 3  ? methocarbamol (ROBAXIN) 500 MG tablet Take 500 mg by mouth 3 (three) times daily.    ? oxyCODONE-acetaminophen (PERCOCET/ROXICET) 5-325 MG tablet Take 2 tablets by mouth every 4 (four) hours as needed for severe pain. 8 tablet 0  ? promethazine (PHENERGAN) 25 MG tablet Take 25 mg by mouth every 6 (six) hours as needed for nausea or vomiting.    ? traMADol (ULTRAM) 50 MG tablet Take 50 mg by mouth every 6 (six) hours as needed for pain.    ? Vitamin D, Ergocalciferol, (DRISDOL) 50000 UNITS CAPS capsule Take 50,000 Units by mouth every 7 (seven) days.    ? ?No  current facility-administered medications on file prior to visit.  ? ? ?Allergies  ?Allergen Reactions  ? Aspirin Other (See Comments)  ?  Pt states her PCP told her not to take aspirin but she can not remember exactly why.  ? Codeine Nausea And Vomiting  ? Hydrocodone Nausea And Vomiting  ? Morphine And Related Nausea And Vomiting  ? Darvon [Propoxyphene] Nausea And Vomiting  ? ? ?Family History  ?Problem Relation Age of Onset  ? Arthritis Mother   ? Hyperlipidemia Mother   ? Heart disease Mother   ? Hypertension Mother   ? Diabetes Mother   ? Arthritis Father   ? Hyperlipidemia Father   ? Heart disease Father   ? Hypertension Father   ? Diabetes Father   ? Alcohol abuse Sister   ? Hypertension Sister   ? Cancer Sister   ? ? ?BP 128/72 (BP Location: Right Arm, Patient Position: Sitting, Cuff Size: Normal)   Pulse 100   Ht '5\' 1"'$  (1.549 m)   Wt 181 lb (82.1 kg)   SpO2 97%   BMI 34.20 kg/m?  ? ? ?Review of Systems ?She has hoarseness x a few days.  Denies HB and nausea.   ?   ?Objective:  ? Physical Exam ?VITAL SIGNS:  See vs page.   ?GENERAL: no distress.   ? ? ?Lab Results  ?Component Value Date  ? CREATININE 0.68 04/25/2021  ? BUN 19 04/25/2021  ? NA 137 04/25/2021  ? K 4.2 04/25/2021  ? CL 101 04/25/2021  ? CO2 27 04/25/2021  ? ?Lab Results  ?Component Value Date  ? HGBA1C 7.9 (A) 02/18/2022  ? ?   ?Assessment & Plan:  ?Type 2 DM: improved glycemic control.  Her surgical risk is low and outweighed by the potential benefit of the surgery.  She is therefore medically cleared.   ?Hoarseness, uncertain etiology and prognosis ? ?Patient Instructions  ?If the hoarseness doe not go away by next week, please call so I can refer you to a specialist.   ?Please continue the same 4 diabetes medications.   ?Please come back for a follow-up appointment in May.   ?check your blood sugar once a day.  vary the time of day when you check, between before the 3 meals, and at bedtime.  also check if you have symptoms of your  blood sugar being too high or too low.  please keep a record of the readings and bring  it to your next appointment here (or you can bring the meter itself).  You can write it on any piece of paper.  please call us sooner if your blood sugar goes below 70, or if you have a lot of readings over 200.   ? ? ?

## 2022-02-18 NOTE — Patient Instructions (Addendum)
If the hoarseness doe not go away by next week, please call so I can refer you to a specialist.   ?Please continue the same 4 diabetes medications.   ?Please come back for a follow-up appointment in May.   ?check your blood sugar once a day.  vary the time of day when you check, between before the 3 meals, and at bedtime.  also check if you have symptoms of your blood sugar being too high or too low.  please keep a record of the readings and bring it to your next appointment here (or you can bring the meter itself).  You can write it on any piece of paper.  please call us sooner if your blood sugar goes below 70, or if you have a lot of readings over 200.   ?

## 2022-03-12 NOTE — Progress Notes (Signed)
Anesthesia Review: ? ?PCP: DR [name]  ?Cardiologist : ?Endocrinologist- DR Renato Shin- [place] 02/18/22  ?Chest x-ray : ?EKG : ?Echo : ?Stress test: ?Cardiac Cath :  ?Activity level:  ?Sleep Study/ CPAP : ?Fasting Blood Sugar :      / Checks Blood Sugar -- times a day:   ?Blood Thinner/ Instructions /Last Dose: ?ASA / Instructions/ Last Dose :   ?Type 2 DM=-  ?Hgba1c-  ?

## 2022-03-12 NOTE — Progress Notes (Signed)
DUE TO COVID-19 ONLY ONE VISITOR IS ALLOWED TO COME WITH YOU AND STAY IN THE WAITING ROOM ONLY DURING PRE OP AND PROCEDURE DAY OF SURGERY.  2 VISITOR  MAY VISIT WITH YOU AFTER SURGERY IN YOUR PRIVATE ROOM DURING VISITING HOURS ONLY! ?YOU MAY HAVE ONE PERSON SPEND THE NITE WITH YOU IN YOUR ROOM AFTER SURGERY.   ? ? ? Your procedure is scheduled on:  ?            04/18/2022  ? Report to [facility] Main  Entrance ? ? Report to admitting at        1015          AM ?DO NOT BRING INSURANCE CARD, PICTURE ID OR WALLET DAY OF SURGERY.  ?  ? ? Call this number if you have problems the morning of surgery [phone]  ? ? REMEMBER: NO  SOLID FOODS , CANDY, GUM OR MINTS AFTER MIDNITE THE NITE BEFORE SURGERY .       [place] Kitchen CLEAR LIQUIDS UNTIL   0915am              DAY OF SURGERY.      PLEASE FINISH ENSURE DRINK PER SURGEON ORDER  WHICH NEEDS TO BE COMPLETED AT   0915am        MORNING OF SURGERY.   ? ? ? ? ?CLEAR LIQUID DIET ? ? ?Foods Allowed      ?WATER ?BLACK COFFEE ( SUGAR OK, NO MILK, CREAM OR CREAMER) REGULAR AND DECAF  ?TEA ( SUGAR OK NO MILK, CREAM, OR CREAMER) REGULAR AND DECAF  ?PLAIN JELLO ( NO RED)  ?FRUIT ICES ( NO RED, NO FRUIT PULP)  ?POPSICLES ( NO RED)  ?JUICE- APPLE, WHITE GRAPE AND WHITE CRANBERRY  ?SPORT DRINK LIKE GATORADE ( NO RED)  ?CLEAR BROTH ( VEGETABLE , CHICKEN OR BEEF)                                                               ? ?    ? ?BRUSH YOUR TEETH MORNING OF SURGERY AND RINSE YOUR MOUTH OUT, NO CHEWING GUM CANDY OR MINTS. ?  ? ? Take these medicines the morning of surgery with A SIP OF WATER:  ?Atenolol, Gabapentin, Synthroid  ? ? ? ? ?DO NOT TAKE ANY DIABETIC MEDICATIONS DAY OF YOUR SURGERY ?                  ?            You may not have any metal on your body including hair pins and  ?            piercings  Do not wear jewelry, make-up, lotions, powders or perfumes, deodorant ?            Do not wear nail polish on your fingernails.   ?           IF YOU ARE A FEMALE AND WANT TO SHAVE  UNDER ARMS OR LEGS PRIOR TO SURGERY YOU MUST DO SO AT LEAST 48 HOURS PRIOR TO SURGERY.  ?            Men may shave face and neck. ? ? Do not bring valuables to the hospital. [place] NOT ?  RESPONSIBLE   FOR VALUABLES. ? Contacts, dentures or bridgework may not be worn into surgery. ? Leave suitcase in the car. After surgery it may be brought to your room. ? ?  ? Patients discharged the day of surgery will not be allowed to drive home. IF YOU ARE HAVING SURGERY AND GOING HOME THE SAME DAY, YOU MUST HAVE AN ADULT TO DRIVE YOU HOME AND BE WITH YOU FOR 24 HOURS. YOU MAY GO HOME BY TAXI OR UBER OR ORTHERWISE, BUT AN ADULT MUST ACCOMPANY YOU HOME AND STAY WITH YOU FOR 24 HOURS. ?  ? ?            Please read over the following fact sheets you were given: ?_____________________________________________________________________ ? ?[facility] - Preparing for Surgery ?Before surgery, you can play an important role.  Because skin is not sterile, your skin needs to be as free of germs as possible.  You can reduce the number of germs on your skin by washing with CHG (chlorahexidine gluconate) soap before surgery.  CHG is an antiseptic cleaner which kills germs and bonds with the skin to continue killing germs even after washing. ?Please DO NOT use if you have an allergy to CHG or antibacterial soaps.  If your skin becomes reddened/irritated stop using the CHG and inform your nurse when you arrive at Short Stay. ?Do not shave (including legs and underarms) for at least 48 hours prior to the first CHG shower.  You may shave your face/neck. ?Please follow these instructions carefully: ? 1.  Shower with CHG Soap the night before surgery and the  morning of Surgery. ? 2.  If you choose to wash your hair, wash your hair first as usual with your  normal  shampoo. ? 3.  After you shampoo, rinse your hair and body thoroughly to remove the  shampoo.                           4.  Use CHG as you would any other liquid soap.   You can apply chg directly  to the skin and wash  ?                     Gently with a scrungie or clean washcloth. ? 5.  Apply the CHG Soap to your body ONLY FROM THE NECK DOWN.   Do not use on face/ open      ?                     Wound or open sores. Avoid contact with eyes, ears mouth and genitals (private parts).  ?                     Production manager,  Genitals (private parts) with your normal soap. ?            6.  Wash thoroughly, paying special attention to the area where your surgery  will be performed. ? 7.  Thoroughly rinse your body with warm water from the neck down. ? 8.  DO NOT shower/wash with your normal soap after using and rinsing off  the CHG Soap. ?               9.  Pat yourself dry with a clean towel. ?           10.  Wear clean pajamas. ?  11.  Place clean sheets on your bed the night of your first shower and do not  sleep with pets. ?Day of Surgery : ?Do not apply any lotions/deodorants the morning of surgery.  Please wear clean clothes to the hospital/surgery center. ? ?FAILURE TO FOLLOW THESE INSTRUCTIONS MAY RESULT IN THE CANCELLATION OF YOUR SURGERY ?PATIENT SIGNATURE_________________________________ ? ?NURSE SIGNATURE__________________________________ ? ?________________________________________________________________________  ? ? ?           ?
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
## 2022-04-01 NOTE — Progress Notes (Signed)
DUE TO COVID-19 ONLY  2  VISITOR IS ALLOWED TO COME WITH YOU AND STAY IN THE WAITING ROOM ONLY DURING PRE OP AND PROCEDURE DAY OF SURGERY.  4 VISITOR  MAY VISIT WITH YOU AFTER SURGERY IN YOUR PRIVATE ROOM DURING VISITING HOURS ONLY! ?YOU MAY HAVE ONE PERSON SPEND THE NITE WITH YOU IN YOUR ROOM AFTER SURGERY.   ? ? Your procedure is scheduled on:  ? 04/18/2022  ? Report to [facility] Main  Entrance ? ? Report to admitting at     1015           AM ?DO NOT BRING INSURANCE CARD, PICTURE ID OR WALLET DAY OF SURGERY.  ?  ? ? Call this number if you have problems the morning of surgery [phone]  ? ? REMEMBER: NO  SOLID FOODS , CANDY, GUM OR MINTS AFTER MIDNITE THE NITE BEFORE SURGERY .       Mar[place] Kitchen CLEAR LIQUIDS UNTIL  0915am               DAY OF SURGERY.      PLEASE FINISH  g 2 [place] DRINK PER SURGEON ORDER  WHICH NEEDS TO BE COMPLETED AT    0915am       MORNING OF SURGERY.   ? ? ? ? ?CLEAR LIQUID DIET ? ? ?Foods Allowed      ?WATER ?BLACK COFFEE ( SUGAR OK, NO MILK, CREAM OR CREAMER) REGULAR AND DECAF  ?TEA ( SUGAR OK NO MILK, CREAM, OR CREAMER) REGULAR AND DECAF  ?PLAIN JELLO ( NO RED)  ?FRUIT ICES ( NO RED, NO FRUIT PULP)  ?POPSICLES ( NO RED)  ?JUICE- APPLE, WHITE GRAPE AND WHITE CRANBERRY  ?SPORT DRINK LIKE GATORADE ( NO RED)  ?CLEAR BROTH ( VEGETABLE , CHICKEN OR BEEF)                                                               ? ?    ? ?BRUSH YOUR TEETH MORNING OF SURGERY AND RINSE YOUR MOUTH OUT, NO CHEWING GUM CANDY OR MINTS. ?  ? ? Take these medicines the morning of surgery with A SIP OF WATER:  ateool, gabapentin, synthroid  ? ? ?DO NOT TAKE ANY DIABETIC MEDICATIONS DAY OF YOUR SURGERY ?                  ?            You may not have any metal on your body including hair pins and  ?            piercings  Do not wear jewelry, make-up, lotions, powders or perfumes, deodorant ?            Do not wear nail polish on your fingernails.   ?           IF YOU ARE A FEMALE AND WANT TO SHAVE UNDER ARMS OR  LEGS PRIOR TO SURGERY YOU MUST DO SO AT LEAST 48 HOURS PRIOR TO SURGERY.  ?            Men may shave face and neck. ? ? Do not bring valuables to the hospital. [place] NOT ?  RESPONSIBLE   FOR VALUABLES. ? Contacts, dentures or bridgework may not be worn into surgery. ? Leave suitcase in the car. After surgery it may be brought to your room. ? ?  ? Patients discharged the day of surgery will not be allowed to drive home. IF YOU ARE HAVING SURGERY AND GOING HOME THE SAME DAY, YOU MUST HAVE AN ADULT TO DRIVE YOU HOME AND BE WITH YOU FOR 24 HOURS. YOU MAY GO HOME BY TAXI OR UBER OR ORTHERWISE, BUT AN ADULT MUST ACCOMPANY YOU HOME AND STAY WITH YOU FOR 24 HOURS. ?  ? ?            Please read over the following fact sheets you were given: ?_____________________________________________________________________ ? ?[facility] - Preparing for Surgery ?Before surgery, you can play an important role.  Because skin is not sterile, your skin needs to be as free of germs as possible.  You can reduce the number of germs on your skin by washing with CHG (chlorahexidine gluconate) soap before surgery.  CHG is an antiseptic cleaner which kills germs and bonds with the skin to continue killing germs even after washing. ?Please DO NOT use if you have an allergy to CHG or antibacterial soaps.  If your skin becomes reddened/irritated stop using the CHG and inform your nurse when you arrive at Short Stay. ?Do not shave (including legs and underarms) for at least 48 hours prior to the first CHG shower.  You may shave your face/neck. ?Please follow these instructions carefully: ? 1.  Shower with CHG Soap the night before surgery and the  morning of Surgery. ? 2.  If you choose to wash your hair, wash your hair first as usual with your  normal  shampoo. ? 3.  After you shampoo, rinse your hair and body thoroughly to remove the  shampoo.                           4.  Use CHG as you would any other liquid soap.  You can apply  chg directly  to the skin and wash  ?                     Gently with a scrungie or clean washcloth. ? 5.  Apply the CHG Soap to your body ONLY FROM THE NECK DOWN.   Do not use on face/ open      ?                     Wound or open sores. Avoid contact with eyes, ears mouth and genitals (private parts).  ?                     Production manager,  Genitals (private parts) with your normal soap. ?            6.  Wash thoroughly, paying special attention to the area where your surgery  will be performed. ? 7.  Thoroughly rinse your body with warm water from the neck down. ? 8.  DO NOT shower/wash with your normal soap after using and rinsing off  the CHG Soap. ?               9.  Pat yourself dry with a clean towel. ?           10.  Wear clean pajamas. ?  11.  Place clean sheets on your bed the night of your first shower and do not  sleep with pets. ?Day of Surgery : ?Do not apply any lotions/deodorants the morning of surgery.  Please wear clean clothes to the hospital/surgery center. ? ?FAILURE TO FOLLOW THESE INSTRUCTIONS MAY RESULT IN THE CANCELLATION OF YOUR SURGERY ?PATIENT SIGNATURE_________________________________ ? ?NURSE SIGNATURE__________________________________ ? ?________________________________________________________________________  ? ? ?           ?

## 2022-04-01 NOTE — H&P (Signed)
?Patient's anticipated LOS is less than 2 midnights, meeting these requirements: ?- Younger than 62 ?- Lives within 1 hour of care ?- Has a competent adult at home to recover with post-op recover ?- NO history of ? - Chronic pain requiring opiods ? - Diabetes ? - Coronary Artery Disease ? - Heart failure ? - Heart attack ? - Stroke ? - DVT/VTE ? - Cardiac arrhythmia ? - Respiratory Failure/COPD ? - Renal failure ? - Anemia ? - Advanced Liver disease ? ?  ? ?[name] is an 81 y.o. female.   ? ?Chief Complaint: left shoulder pain ? ?HPI: Pt is a 81 y.o. female complaining of left shoulder pain for multiple years. Pain had continually increased since the beginning. X-rays in the clinic show end-stage arthritic changes of the left shoulder. Pt has tried various conservative treatments which have failed to alleviate their symptoms, including injections and therapy. Various options are discussed with the patient. Risks, benefits and expectations were discussed with the patient. Patient understand the risks, benefits and expectations and wishes to proceed with surgery.  ? ?PCP:  [name], MD ? ?D/C Plans: Home ? ?PMH: ?Past Medical History:  ?Diagnosis Date  ? Arthritis   ? Depression   ? Diabetes mellitus without complication ([place])   ? Hypertension   ? ? ?PSH: ?Past Surgical History:  ?Procedure Laterality Date  ? ABDOMINAL HYSTERECTOMY    ? BACK SURGERY  2006  ? EXCISION MASS LOWER EXTREMETIES Left 07/11/2019  ? Procedure: EXCISION MASS foot;  Surgeon: Netta Cedars, MD;  Location: [facility];  Service: Orthopedics;  Laterality: Left;  ? GALLBLADDER SURGERY  2005  ? HERNIA REPAIR    ? KNEE SURGERY  2001/2005  ? ? ?Social History:  reports that she quit smoking about 43 years ago. Her smoking use included cigarettes. She has a 0.15 pack-year smoking history. She has never used smokeless tobacco. She reports that she does not drink alcohol and does not use drugs. ?BMI: ?Estimated body mass index is  34.2 kg/m? as calculated from the following: ?  Height as of 02/18/22: '5\' 1"'$  (1.549 m). ?  Weight as of 02/18/22: 82.1 kg. ? ?Lab Results  ?Component Value Date  ? ALBUMIN 3.9 02/08/2008  ? ?Diabetes: ?  ?Patient has a diagnosis of diabetes,  ?Lab Results  ?Component Value Date  ? HGBA1C 7.9 (A) 02/18/2022  ? ?Smoking Status: ?Social History  ? ?Tobacco Use  ?Smoking Status Former  ? Packs/day: 0.05  ? Years: 3.00  ? Pack years: 0.15  ? Types: Cigarettes  ? Quit date: 4  ? Years since quitting: 43.3  ?Smokeless Tobacco Never  ? ?The patient is not currently a tobacco user. ?Counseling given: Not Answered   ?  ?Allergies:  ?Allergies  ?Allergen Reactions  ? Aspirin Other (See Comments)  ?  Pt states her PCP told her not to take aspirin but she can not remember exactly why.  ? Codeine Nausea And Vomiting  ? Hydrocodone Nausea And Vomiting  ? Morphine And Related Nausea And Vomiting  ? Darvon [Propoxyphene] Nausea And Vomiting  ? ? ?Medications: ?No current facility-administered medications for this encounter.  ? ?Current Outpatient Medications  ?Medication Sig Dispense Refill  ? acarbose (PRECOSE) 25 MG tablet TAKE 1 TABLET BY MOUTH 3 TIMES A DAY WITH MEALS 270 tablet 2  ? Accu-Chek FastClix Lancets MISC 1 each by Does not apply route daily. E11.9 90 each 3  ? ACCU-CHEK GUIDE test strip  USE TO TEST BLOOD GLUCOSE 2 TIMES DAILY, AS INSTRUCTED. DX CODE: 250.00 200 strip 2  ? acetaminophen (TYLENOL) 325 MG tablet Take 650 mg by mouth every 6 (six) hours as needed for pain.    ? ALPRAZolam (XANAX) 1 MG tablet Take 1 mg by mouth at bedtime as needed for sleep.    ? atenolol (TENORMIN) 25 MG tablet Take 1 tablet (25 mg total) by mouth daily. 90 tablet 3  ? CRESTOR 5 MG tablet Take 5 mg by mouth daily.    ? diclofenac sodium (VOLTAREN) 1 % GEL Apply 4 g topically 4 (four) times daily.    ? enalapril (VASOTEC) 10 MG tablet     ? flurbiprofen (ANSAID) 100 MG tablet     ? gabapentin (NEURONTIN) 300 MG capsule Take 300 mg by  mouth 3 (three) times daily.    ? hydrochlorothiazide (MICROZIDE) 12.5 MG capsule Take 1 capsule (12.5 mg total) by mouth daily. 90 capsule 3  ? levothyroxine (SYNTHROID) 50 MCG tablet Take 1 tablet (50 mcg total) by mouth daily. 90 tablet 3  ? metFORMIN (GLUCOPHAGE-XR) 500 MG 24 hr tablet Take 4 tablets (2,000 mg total) by mouth daily with breakfast. 360 tablet 3  ? methocarbamol (ROBAXIN) 500 MG tablet Take 500 mg by mouth 3 (three) times daily.    ? oxyCODONE-acetaminophen (PERCOCET/ROXICET) 5-325 MG tablet Take 2 tablets by mouth every 4 (four) hours as needed for severe pain. 8 tablet 0  ? promethazine (PHENERGAN) 25 MG tablet Take 25 mg by mouth every 6 (six) hours as needed for nausea or vomiting.    ? repaglinide (PRANDIN) 2 MG tablet Take 1 tablet (2 mg total) by mouth 3 (three) times daily before meals. 270 tablet 3  ? Semaglutide (RYBELSUS) 14 MG TABS Take 1 tablet (14 mg total) by mouth daily. 90 tablet 3  ? traMADol (ULTRAM) 50 MG tablet Take 50 mg by mouth every 6 (six) hours as needed for pain.    ? Vitamin D, Ergocalciferol, (DRISDOL) 50000 UNITS CAPS capsule Take 50,000 Units by mouth every 7 (seven) days.    ? ? ?No results found for this or any previous visit (from the past 48 hour(s)). ?No results found. ? ?ROS: ?Pain with rom of the left upper extremity ? ?Physical Exam: ?Alert and oriented 81 y.o. female in no acute distress ?Cranial nerves 2-12 intact ?Cervical spine: full rom with no tenderness, nv intact distally ?Chest: active breath sounds bilaterally, no wheeze rhonchi or rales ?Heart: regular rate and rhythm, no murmur ?Abd: non tender non distended with active bowel sounds ?Hip is stable with rom  ?Left shoulder painful and weak rom ?Nv intact distally ?No rashes or edema distally ? ?Assessment/Plan ?Assessment: left shoulder cuff arthropathy ? ?Plan: ? ?Patient will undergo a left reverse total shoulder by Dr. Veverly Fells at [place] Risks benefits and expectations were discussed with the  patient. Patient understand risks, benefits and expectations and wishes to proceed. ?Preoperative templating of the joint replacement has been completed, documented, and submitted to the Operating Room personnel in order to optimize intra-operative equipment management.  ? ?Merla Riches PA-C, MPAS ?[place] is now [employer]  [facility] ?[address], [address], [place], [place] [address] ?Phone: [phone] ?[email or web address] ?Facebook  Engineer, structural  ? ?  ? ?

## 2022-04-07 ENCOUNTER — Ambulatory Visit (INDEPENDENT_AMBULATORY_CARE_PROVIDER_SITE_OTHER): Payer: Medicare Other | Admitting: Endocrinology

## 2022-04-07 ENCOUNTER — Encounter (HOSPITAL_COMMUNITY): Payer: Self-pay

## 2022-04-07 ENCOUNTER — Encounter (HOSPITAL_COMMUNITY)
Admission: RE | Admit: 2022-04-07 | Discharge: 2022-04-07 | Disposition: A | Discharge status: Home / Self Care | Payer: Medicare Other | Source: Ambulatory Visit | Attending: Orthopedic Surgery | Admitting: Orthopedic Surgery

## 2022-04-07 ENCOUNTER — Other Ambulatory Visit: Payer: Self-pay

## 2022-04-07 VITALS — BP 134/86 | HR 102 | Temp 98.5°F | Ht 61.0 in | Wt 175.0 lb

## 2022-04-07 DIAGNOSIS — E119 Type 2 diabetes mellitus without complications: Secondary | ICD-10-CM | POA: Diagnosis not present

## 2022-04-07 DIAGNOSIS — R490 Dysphonia: Secondary | ICD-10-CM | POA: Diagnosis not present

## 2022-04-07 DIAGNOSIS — Z01818 Encounter for other preprocedural examination: Secondary | ICD-10-CM | POA: Insufficient documentation

## 2022-04-07 HISTORY — DX: Malignant (primary) neoplasm, unspecified: C80.1

## 2022-04-07 HISTORY — DX: Anxiety disorder, unspecified: F41.9

## 2022-04-07 HISTORY — DX: Hypothyroidism, unspecified: E03.9

## 2022-04-07 LAB — CBC
HCT: 38.3 % (ref 36.0–46.0)
Hemoglobin: 12.6 g/dL (ref 12.0–15.0)
MCH: 29.7 pg (ref 26.0–34.0)
MCHC: 32.9 g/dL (ref 30.0–36.0)
MCV: 90.3 fL (ref 80.0–100.0)
Platelets: 254 10*3/uL (ref 150–400)
RBC: 4.24 MIL/uL (ref 3.87–5.11)
RDW: 12.9 % (ref 11.5–15.5)
WBC: 6.9 10*3/uL (ref 4.0–10.5)
nRBC: 0 % (ref 0.0–0.2)

## 2022-04-07 LAB — BASIC METABOLIC PANEL
Anion gap: 10 (ref 5–15)
BUN: 9 mg/dL (ref 8–23)
CO2: 24 mmol/L (ref 22–32)
Calcium: 8.8 mg/dL — ABNORMAL LOW (ref 8.9–10.3)
Chloride: 103 mmol/L (ref 98–111)
Creatinine, Ser: 0.72 mg/dL (ref 0.44–1.00)
GFR, Estimated: >60 mL/min (ref >60)
Glucose, Bld: 198 mg/dL — ABNORMAL HIGH (ref 70–99)
Potassium: 3.8 mmol/L (ref 3.5–5.1)
Sodium: 137 mmol/L (ref 135–145)

## 2022-04-07 LAB — POCT GLYCOSYLATED HEMOGLOBIN (HGB A1C): Hemoglobin A1C: 7 % — AB (ref 4.0–5.6)

## 2022-04-07 LAB — SURGICAL PCR SCREEN
MRSA, PCR: NEGATIVE
Staphylococcus aureus: NEGATIVE

## 2022-04-07 LAB — GLUCOSE, CAPILLARY: Glucose-Capillary: 210 mg/dL — ABNORMAL HIGH (ref 70–99)

## 2022-04-07 NOTE — Patient Instructions (Signed)
[facility]- Preparing for Total Shoulder Arthroplasty  °  °Before surgery, you can play an important role. Because skin is not sterile, your skin needs to be as free of germs as possible. You can reduce the number of germs on your skin by using the following products. °Benzoyl Peroxide Gel °Reduces the number of germs present on the skin °Applied twice a day to shoulder area starting two days before surgery   ° °================================================================== ° °Please follow these instructions carefully: ° °BENZOYL PEROXIDE 5% GEL ° °Please do not use if you have an allergy to benzoyl peroxide.   If your skin becomes reddened/irritated stop using the benzoyl peroxide. ° °Starting two days before surgery, apply as follows: °Apply benzoyl peroxide in the morning and at night. Apply after taking a shower. If you are not taking a shower clean entire shoulder front, back, and side along with the armpit with a clean wet washcloth. ° °Place a quarter-sized dollop on your shoulder and rub in thoroughly, making sure to cover the front, back, and side of your shoulder, along with the armpit.  ° °2 days before ____ AM   ____ PM              1 day before ____ AM   ____ PM °                        °Do this twice a day for two days.  (Last application is the night before surgery, AFTER using the CHG soap as described below). ° °Do NOT apply benzoyl peroxide gel on the day of surgery.  °

## 2022-04-07 NOTE — Progress Notes (Signed)
? ?Subjective:  ? ? Patient ID: [name], female    DOB: July 05, 1941, 81 y.o.   MRN: [id number] ? ?HPI ?Pt returns for f/u of diabetes mellitus: ?DM type: 2 ?Dx'ed: 1993 ?Complications: none.  ?Therapy: 4 oral meds.  ?GDM: never ?DKA: never.   ?Severe hypoglycemia: never.    ?Pancreatitis: never.   ?Other: she stopped bromocriptine (hypoglycemia), invokana (dizziness), Farxiga (hypotension), or pioglitazone (edema).  She has learned about insulin, just in case.   ?Interval history: pt says cbg's are well-controlled.  No recent steroids.   ?She also has MNG (she had bx in 2022 of 2 nodules, both cat 3, but Afirma was low risk.  She takes synthroid for hypothyroidism since 2022).  She takes synthroid as rx'ed.   ?Past Medical History:  ?Diagnosis Date  ? Anxiety   ? Arthritis   ? Cancer [facility])   ? Depression   ? Diabetes mellitus without complication ([place])   ? Hypertension   ? Hypothyroidism   ? ? ?Past Surgical History:  ?Procedure Laterality Date  ? ABDOMINAL HYSTERECTOMY    ? BACK SURGERY  2006  ? EXCISION MASS LOWER EXTREMETIES Left 07/11/2019  ? Procedure: EXCISION MASS foot;  Surgeon: Netta Cedars, MD;  Location: [facility];  Service: Orthopedics;  Laterality: Left;  ? GALLBLADDER SURGERY  2005  ? HERNIA REPAIR    ? KNEE SURGERY  2001/2005  ? ? ?Social History  ? ?Socioeconomic History  ? Marital status: Unknown  ?  Spouse name: Not on file  ? Number of children: Not on file  ? Years of education: Not on file  ? Highest education level: Not on file  ?Occupational History  ? Not on file  ?Tobacco Use  ? Smoking status: Former  ?  Packs/day: 0.05  ?  Years: 3.00  ?  Pack years: 0.15  ?  Types: Cigarettes  ?  Quit date: 85  ?  Years since quitting: 43.3  ? Smokeless tobacco: Never  ?Vaping Use  ? Vaping Use: Never used  ?Substance and Sexual Activity  ? Alcohol use: Never  ? Drug use: Never  ? Sexual activity: Not Currently  ?Other Topics Concern  ? Not on file  ?Social History Narrative  ? **  Merged History Encounter **  ?    ? ?Social Determinants of Health  ? ?Financial Resource Strain: Not on file  ?Food Insecurity: Not on file  ?Transportation Needs: Not on file  ?Physical Activity: Not on file  ?Stress: Not on file  ?Social Connections: Not on file  ?Intimate Partner Violence: Not on file  ? ? ?Current Outpatient Medications on File Prior to Visit  ?Medication Sig Dispense Refill  ? acarbose (PRECOSE) 25 MG tablet TAKE 1 TABLET BY MOUTH 3 TIMES A DAY WITH MEALS 270 tablet 2  ? Accu-Chek FastClix Lancets MISC 1 each by Does not apply route daily. E11.9 90 each 3  ? ACCU-CHEK GUIDE test strip USE TO TEST BLOOD GLUCOSE 2 TIMES DAILY, AS INSTRUCTED. DX CODE: 250.00 200 strip 2  ? acetaminophen (TYLENOL) 325 MG tablet Take 650 mg by mouth every 6 (six) hours as needed for pain.    ? alprazolam (XANAX) 2 MG tablet Take 2 mg by mouth 2 (two) times daily.    ? atenolol (TENORMIN) 25 MG tablet Take 1 tablet (25 mg total) by mouth daily. (Patient not taking: Reported on 04/01/2022) 90 tablet 3  ? Calcium Citrate-Vitamin D (CALCIUM CITRATE + D PO) Take  1 tablet by mouth daily.    ? CRESTOR 5 MG tablet Take 5 mg by mouth daily.    ? diclofenac sodium (VOLTAREN) 1 % GEL Apply 4 g topically 4 (four) times daily as needed (LEG PAIN).    ? famotidine (PEPCID) 20 MG tablet Take 20 mg by mouth daily.    ? gabapentin (NEURONTIN) 300 MG capsule Take 300 mg by mouth 3 (three) times daily.    ? hydrochlorothiazide (MICROZIDE) 12.5 MG capsule Take 1 capsule (12.5 mg total) by mouth daily. (Patient not taking: Reported on 04/01/2022) 90 capsule 3  ? levothyroxine (SYNTHROID) 50 MCG tablet Take 1 tablet (50 mcg total) by mouth daily. 90 tablet 3  ? metFORMIN (GLUCOPHAGE-XR) 500 MG 24 hr tablet Take 4 tablets (2,000 mg total) by mouth daily with breakfast. (Patient taking differently: Take 1,000 mg by mouth in the morning and at bedtime.) 360 tablet 3  ? methocarbamol (ROBAXIN) 500 MG tablet Take 500 mg by mouth 4 (four) times  daily as needed for muscle spasms.    ? mirtazapine (REMERON) 15 MG tablet Take 15 mg by mouth at bedtime.    ? olmesartan (BENICAR) 5 MG tablet Take 5 mg by mouth daily.    ? oxyCODONE-acetaminophen (PERCOCET/ROXICET) 5-325 MG tablet Take 2 tablets by mouth every 4 (four) hours as needed for severe pain. (Patient not taking: Reported on 04/01/2022) 8 tablet 0  ? promethazine (PHENERGAN) 25 MG tablet Take 25 mg by mouth every 6 (six) hours as needed for nausea or vomiting.    ? repaglinide (PRANDIN) 2 MG tablet Take 1 tablet (2 mg total) by mouth 3 (three) times daily before meals. 270 tablet 3  ? Semaglutide (RYBELSUS) 14 MG TABS Take 1 tablet (14 mg total) by mouth daily. 90 tablet 3  ? Vitamin D, Ergocalciferol, (DRISDOL) 50000 UNITS CAPS capsule Take 50,000 Units by mouth every 7 (seven) days.    ? ?No current facility-administered medications on file prior to visit.  ? ? ?Allergies  ?Allergen Reactions  ? Aspirin Other (See Comments)  ?  Pt states her PCP told her not to take aspirin but she can not remember exactly why.  ? Codeine Nausea And Vomiting  ? Hydrocodone Nausea And Vomiting  ? Latex Itching  ? Morphine And Related Nausea And Vomiting  ? Darvon [Propoxyphene] Nausea And Vomiting  ? ? ?Family History  ?Problem Relation Age of Onset  ? Arthritis Mother   ? Hyperlipidemia Mother   ? Heart disease Mother   ? Hypertension Mother   ? Diabetes Mother   ? Arthritis Father   ? Hyperlipidemia Father   ? Heart disease Father   ? Hypertension Father   ? Diabetes Father   ? Alcohol abuse Sister   ? Hypertension Sister   ? Cancer Sister   ? ? ?There were no vitals taken for this visit. ? ? ?Review of Systems ?Hoarseness persists.   ?   ?Objective:  ? Physical Exam ?VITAL SIGNS:  See vs page ?GENERAL: no distress.   ? ?Lab Results  ?Component Value Date  ? TSH 4.34 12/17/2021  ? ? ?Lab Results  ?Component Value Date  ? HGBA1C 7.0 (A) 04/07/2022  ? ?   ?Assessment & Plan:  ?Type 2 DM.   ?Hoarseness, persistent.   uncertain etiology and prognosis ? ?Patient Instructions  ?Please see an ear-nose-throat specialist.  you will receive a phone call, about a day and time for an appointment ?Please continue the same  4 diabetes medications.   ?check your blood sugar once a day.  vary the time of day when you check, between before the 3 meals, and at bedtime.  also check if you have symptoms of your blood sugar being too high or too low.  please keep a record of the readings and bring it to your next appointment here (or you can bring the meter itself).  You can write it on any piece of paper.  please call us sooner if your blood sugar goes below 70, or if you have a lot of readings over 200.   ? ? ?

## 2022-04-07 NOTE — Patient Instructions (Addendum)
Please see an ear-nose-throat specialist.  you will receive a phone call, about a day and time for an appointment ?Please continue the same 4 diabetes medications.   ?check your blood sugar once a day.  vary the time of day when you check, between before the 3 meals, and at bedtime.  also check if you have symptoms of your blood sugar being too high or too low.  please keep a record of the readings and bring it to your next appointment here (or you can bring the meter itself).  You can write it on any piece of paper.  please call us sooner if your blood sugar goes below 70, or if you have a lot of readings over 200.   ?

## 2022-04-18 ENCOUNTER — Inpatient Hospital Stay (HOSPITAL_COMMUNITY)
Admission: AD | Admit: 2022-04-18 | Discharge: 2022-04-22 | DRG: 483 | Disposition: A | Discharge status: Home / Self Care | Payer: Medicare Other | Attending: Orthopedic Surgery | Admitting: Orthopedic Surgery

## 2022-04-18 ENCOUNTER — Ambulatory Visit (HOSPITAL_COMMUNITY): Payer: Medicare Other | Admitting: Certified Registered"

## 2022-04-18 ENCOUNTER — Encounter (HOSPITAL_COMMUNITY)
Admission: AD | Disposition: A | Discharge status: Home / Self Care | Payer: Self-pay | Source: Home / Self Care | Attending: Orthopedic Surgery

## 2022-04-18 ENCOUNTER — Encounter (HOSPITAL_COMMUNITY): Payer: Self-pay | Admitting: Orthopedic Surgery

## 2022-04-18 ENCOUNTER — Other Ambulatory Visit: Payer: Self-pay

## 2022-04-18 ENCOUNTER — Inpatient Hospital Stay (HOSPITAL_COMMUNITY): Payer: Medicare Other

## 2022-04-18 DIAGNOSIS — E119 Type 2 diabetes mellitus without complications: Secondary | ICD-10-CM | POA: Diagnosis present

## 2022-04-18 DIAGNOSIS — I10 Essential (primary) hypertension: Secondary | ICD-10-CM | POA: Diagnosis not present

## 2022-04-18 DIAGNOSIS — Z886 Allergy status to analgesic agent status: Secondary | ICD-10-CM | POA: Diagnosis not present

## 2022-04-18 DIAGNOSIS — M75122 Complete rotator cuff tear or rupture of left shoulder, not specified as traumatic: Secondary | ICD-10-CM | POA: Diagnosis not present

## 2022-04-18 DIAGNOSIS — F418 Other specified anxiety disorders: Secondary | ICD-10-CM

## 2022-04-18 DIAGNOSIS — Z87891 Personal history of nicotine dependence: Secondary | ICD-10-CM | POA: Diagnosis not present

## 2022-04-18 DIAGNOSIS — F32A Depression, unspecified: Secondary | ICD-10-CM | POA: Diagnosis not present

## 2022-04-18 DIAGNOSIS — Z7984 Long term (current) use of oral hypoglycemic drugs: Secondary | ICD-10-CM

## 2022-04-18 DIAGNOSIS — Z90710 Acquired absence of both cervix and uterus: Secondary | ICD-10-CM

## 2022-04-18 DIAGNOSIS — Z79899 Other long term (current) drug therapy: Secondary | ICD-10-CM

## 2022-04-18 DIAGNOSIS — Y92410 Unspecified street and highway as the place of occurrence of the external cause: Secondary | ICD-10-CM | POA: Diagnosis not present

## 2022-04-18 DIAGNOSIS — Z888 Allergy status to other drugs, medicaments and biological substances status: Secondary | ICD-10-CM

## 2022-04-18 DIAGNOSIS — Z79890 Hormone replacement therapy (postmenopausal): Secondary | ICD-10-CM | POA: Diagnosis not present

## 2022-04-18 DIAGNOSIS — M75102 Unspecified rotator cuff tear or rupture of left shoulder, not specified as traumatic: Secondary | ICD-10-CM

## 2022-04-18 DIAGNOSIS — Z96612 Presence of left artificial shoulder joint: Secondary | ICD-10-CM | POA: Diagnosis not present

## 2022-04-18 DIAGNOSIS — E039 Hypothyroidism, unspecified: Secondary | ICD-10-CM | POA: Diagnosis present

## 2022-04-18 DIAGNOSIS — Z96642 Presence of left artificial hip joint: Secondary | ICD-10-CM | POA: Diagnosis not present

## 2022-04-18 DIAGNOSIS — F419 Anxiety disorder, unspecified: Secondary | ICD-10-CM | POA: Diagnosis present

## 2022-04-18 DIAGNOSIS — Z471 Aftercare following joint replacement surgery: Secondary | ICD-10-CM | POA: Diagnosis not present

## 2022-04-18 DIAGNOSIS — Z885 Allergy status to narcotic agent status: Secondary | ICD-10-CM

## 2022-04-18 DIAGNOSIS — M25512 Pain in left shoulder: Secondary | ICD-10-CM | POA: Diagnosis not present

## 2022-04-18 DIAGNOSIS — G8918 Other acute postprocedural pain: Secondary | ICD-10-CM | POA: Diagnosis not present

## 2022-04-18 HISTORY — PX: REVERSE SHOULDER ARTHROPLASTY: SHX5054

## 2022-04-18 LAB — GLUCOSE, CAPILLARY
Glucose-Capillary: 169 mg/dL — ABNORMAL HIGH (ref 70–99)
Glucose-Capillary: 172 mg/dL — ABNORMAL HIGH (ref 70–99)
Glucose-Capillary: 324 mg/dL — ABNORMAL HIGH (ref 70–99)
Glucose-Capillary: 234 mg/dL — ABNORMAL HIGH (ref 70–99)

## 2022-04-18 SURGERY — ARTHROPLASTY, SHOULDER, TOTAL, REVERSE
Anesthesia: General | Site: Shoulder | Laterality: Left

## 2022-04-18 MED ORDER — BUPIVACAINE-EPINEPHRINE (PF) 0.25% -1:200000 IJ SOLN
INTRAMUSCULAR | Status: DC | PRN
  Administered 2022-04-18: 13 mL

## 2022-04-18 MED ORDER — BUPIVACAINE-EPINEPHRINE (PF) 0.25% -1:200000 IJ SOLN
INTRAMUSCULAR | Status: AC
  Filled 2022-04-18: qty 30

## 2022-04-18 MED ORDER — ACARBOSE 25 MG PO TABS
25.0000 mg | ORAL_TABLET | Freq: Three times a day (TID) | ORAL | Status: DC
  Administered 2022-04-18 – 2022-04-22 (×12): 25 mg via ORAL
  Filled 2022-04-18 (×12): qty 1

## 2022-04-18 MED ORDER — PROPOFOL 10 MG/ML IV BOLUS
INTRAVENOUS | Status: DC | PRN
  Administered 2022-04-18: 110 mg via INTRAVENOUS

## 2022-04-18 MED ORDER — EPHEDRINE 5 MG/ML INJ
INTRAVENOUS | Status: AC
  Filled 2022-04-18: qty 10

## 2022-04-18 MED ORDER — ALPRAZOLAM 1 MG PO TABS
2.0000 mg | ORAL_TABLET | Freq: Two times a day (BID) | ORAL | Status: DC
  Administered 2022-04-18 – 2022-04-22 (×8): 2 mg via ORAL
  Filled 2022-04-18 (×8): qty 2

## 2022-04-18 MED ORDER — INSULIN ASPART 100 UNIT/ML IJ SOLN
0.0000 [IU] | Freq: Three times a day (TID) | INTRAMUSCULAR | Status: DC
  Administered 2022-04-18: 15 [IU] via SUBCUTANEOUS
  Administered 2022-04-19: 7 [IU] via SUBCUTANEOUS
  Administered 2022-04-19: 4 [IU] via SUBCUTANEOUS
  Administered 2022-04-20: 3 [IU] via SUBCUTANEOUS
  Administered 2022-04-20: 4 [IU] via SUBCUTANEOUS
  Administered 2022-04-21: 3 [IU] via SUBCUTANEOUS
  Administered 2022-04-21 – 2022-04-22 (×3): 4 [IU] via SUBCUTANEOUS
  Administered 2022-04-22: 7 [IU] via SUBCUTANEOUS

## 2022-04-18 MED ORDER — INSULIN ASPART 100 UNIT/ML IJ SOLN
6.0000 [IU] | Freq: Three times a day (TID) | INTRAMUSCULAR | Status: DC
  Administered 2022-04-20 – 2022-04-22 (×8): 6 [IU] via SUBCUTANEOUS

## 2022-04-18 MED ORDER — ACETAMINOPHEN 325 MG PO TABS: 650.0000 mg | ORAL_TABLET | Freq: Four times a day (QID) | ORAL | Status: DC | PRN

## 2022-04-18 MED ORDER — CALCIUM CITRATE 950 (200 CA) MG PO TABS
200.0000 mg | ORAL_TABLET | Freq: Every day | ORAL | Status: DC
  Administered 2022-04-19 – 2022-04-22 (×4): 200 mg via ORAL
  Filled 2022-04-18 (×5): qty 1

## 2022-04-18 MED ORDER — FENTANYL CITRATE PF 50 MCG/ML IJ SOSY
50.0000 ug | PREFILLED_SYRINGE | INTRAMUSCULAR | Status: DC
  Administered 2022-04-18: 100 ug via INTRAVENOUS
  Filled 2022-04-18: qty 2

## 2022-04-18 MED ORDER — SODIUM CHLORIDE 0.9 % IR SOLN
Status: DC | PRN
  Administered 2022-04-18: 1000 mL

## 2022-04-18 MED ORDER — DICLOFENAC SODIUM 1 % TD GEL: 4.0000 g | Freq: Four times a day (QID) | TRANSDERMAL | Status: DC | PRN

## 2022-04-18 MED ORDER — PHENYLEPHRINE HCL (PRESSORS) 10 MG/ML IV SOLN
INTRAVENOUS | Status: AC
  Filled 2022-04-18: qty 1

## 2022-04-18 MED ORDER — METOCLOPRAMIDE HCL 5 MG/ML IJ SOLN: 5.0000 mg | Freq: Three times a day (TID) | INTRAMUSCULAR | Status: DC | PRN

## 2022-04-18 MED ORDER — FENTANYL CITRATE (PF) 100 MCG/2ML IJ SOLN
INTRAMUSCULAR | Status: DC | PRN
  Administered 2022-04-18: 100 ug via INTRAVENOUS

## 2022-04-18 MED ORDER — DICLOFENAC SODIUM 1 % EX GEL
4.0000 g | Freq: Four times a day (QID) | CUTANEOUS | Status: DC | PRN
  Filled 2022-04-18: qty 100

## 2022-04-18 MED ORDER — GABAPENTIN 300 MG PO CAPS
300.0000 mg | ORAL_CAPSULE | Freq: Three times a day (TID) | ORAL | Status: DC
  Administered 2022-04-18 – 2022-04-22 (×11): 300 mg via ORAL
  Filled 2022-04-18 (×11): qty 1

## 2022-04-18 MED ORDER — MENTHOL 3 MG MT LOZG: 1.0000 | LOZENGE | OROMUCOSAL | Status: DC | PRN

## 2022-04-18 MED ORDER — ONDANSETRON HCL 4 MG/2ML IJ SOLN: 4.0000 mg | Freq: Four times a day (QID) | INTRAMUSCULAR | Status: DC | PRN

## 2022-04-18 MED ORDER — LEVOTHYROXINE SODIUM 50 MCG PO TABS
50.0000 ug | ORAL_TABLET | Freq: Every day | ORAL | Status: DC
  Administered 2022-04-19 – 2022-04-22 (×4): 50 ug via ORAL
  Filled 2022-04-18 (×4): qty 1

## 2022-04-18 MED ORDER — LIDOCAINE HCL (CARDIAC) PF 100 MG/5ML IV SOSY
PREFILLED_SYRINGE | INTRAVENOUS | Status: DC | PRN
  Administered 2022-04-18: 40 mg via INTRAVENOUS

## 2022-04-18 MED ORDER — BUPIVACAINE HCL (PF) 0.5 % IJ SOLN
INTRAMUSCULAR | Status: DC | PRN
  Administered 2022-04-18: 20 mL via PERINEURAL

## 2022-04-18 MED ORDER — ALBUTEROL SULFATE HFA 108 (90 BASE) MCG/ACT IN AERS
INHALATION_SPRAY | RESPIRATORY_TRACT | Status: AC
  Filled 2022-04-18: qty 6.7

## 2022-04-18 MED ORDER — DOCUSATE SODIUM 100 MG PO CAPS
100.0000 mg | ORAL_CAPSULE | Freq: Two times a day (BID) | ORAL | Status: DC
  Administered 2022-04-18 – 2022-04-22 (×8): 100 mg via ORAL
  Filled 2022-04-18 (×8): qty 1

## 2022-04-18 MED ORDER — HYDROMORPHONE HCL 1 MG/ML IJ SOLN: 0.5000 mg | INTRAMUSCULAR | Status: DC | PRN

## 2022-04-18 MED ORDER — METHOCARBAMOL 500 MG PO TABS
500.0000 mg | ORAL_TABLET | Freq: Four times a day (QID) | ORAL | Status: DC | PRN
  Administered 2022-04-18 – 2022-04-22 (×4): 500 mg via ORAL
  Filled 2022-04-18 (×5): qty 1

## 2022-04-18 MED ORDER — IRBESARTAN 75 MG PO TABS
37.5000 mg | ORAL_TABLET | Freq: Every day | ORAL | Status: DC
  Administered 2022-04-19 – 2022-04-22 (×4): 37.5 mg via ORAL
  Filled 2022-04-18 (×4): qty 1

## 2022-04-18 MED ORDER — OXYCODONE HCL 5 MG/5ML PO SOLN: 5.0000 mg | Freq: Once | ORAL | Status: DC | PRN

## 2022-04-18 MED ORDER — FENTANYL CITRATE (PF) 100 MCG/2ML IJ SOLN
INTRAMUSCULAR | Status: AC
  Filled 2022-04-18: qty 2

## 2022-04-18 MED ORDER — METHOCARBAMOL 500 MG PO TABS: 500.0000 mg | ORAL_TABLET | Freq: Three times a day (TID) | ORAL | 1 refills | Status: AC | PRN

## 2022-04-18 MED ORDER — SUCCINYLCHOLINE CHLORIDE 200 MG/10ML IV SOSY
PREFILLED_SYRINGE | INTRAVENOUS | Status: DC | PRN
  Administered 2022-04-18: 120 mg via INTRAVENOUS

## 2022-04-18 MED ORDER — PROPOFOL 10 MG/ML IV BOLUS
INTRAVENOUS | Status: AC
  Filled 2022-04-18: qty 20

## 2022-04-18 MED ORDER — ONDANSETRON HCL 4 MG/2ML IJ SOLN
INTRAMUSCULAR | Status: DC | PRN
Start: 2022-04-18 — End: 2022-04-18
  Administered 2022-04-18: 4 mg via INTRAVENOUS

## 2022-04-18 MED ORDER — METHOCARBAMOL 1000 MG/10ML IJ SOLN
500.0000 mg | Freq: Four times a day (QID) | INTRAVENOUS | Status: DC | PRN
  Filled 2022-04-18: qty 5

## 2022-04-18 MED ORDER — PHENOL 1.4 % MT LIQD: 1.0000 | OROMUCOSAL | Status: DC | PRN

## 2022-04-18 MED ORDER — REPAGLINIDE 1 MG PO TABS
2.0000 mg | ORAL_TABLET | Freq: Three times a day (TID) | ORAL | Status: DC
  Administered 2022-04-18 – 2022-04-22 (×12): 2 mg via ORAL
  Filled 2022-04-18 (×5): qty 2
  Filled 2022-04-18: qty 1
  Filled 2022-04-18 (×9): qty 2

## 2022-04-18 MED ORDER — PHENYLEPHRINE HCL-NACL 20-0.9 MG/250ML-% IV SOLN
INTRAVENOUS | Status: DC | PRN
  Administered 2022-04-18: 60 ug/min via INTRAVENOUS

## 2022-04-18 MED ORDER — CHLORHEXIDINE GLUCONATE 0.12 % MT SOLN
15.0000 mL | Freq: Once | OROMUCOSAL | Status: AC
  Administered 2022-04-18: 15 mL via OROMUCOSAL

## 2022-04-18 MED ORDER — CHOLECALCIFEROL 10 MCG (400 UNIT) PO TABS
400.0000 [IU] | ORAL_TABLET | Freq: Every day | ORAL | Status: DC
  Administered 2022-04-18 – 2022-04-22 (×5): 400 [IU] via ORAL
  Filled 2022-04-18 (×5): qty 1

## 2022-04-18 MED ORDER — OXYCODONE HCL 5 MG PO TABS
5.0000 mg | ORAL_TABLET | ORAL | Status: DC | PRN
  Administered 2022-04-19 (×2): 5 mg via ORAL
  Administered 2022-04-19 – 2022-04-20 (×2): 10 mg via ORAL
  Administered 2022-04-22 (×2): 5 mg via ORAL
  Filled 2022-04-18: qty 1
  Filled 2022-04-18: qty 2
  Filled 2022-04-18 (×2): qty 1
  Filled 2022-04-18: qty 2
  Filled 2022-04-18: qty 1

## 2022-04-18 MED ORDER — VITAMIN D (ERGOCALCIFEROL) 1.25 MG (50000 UNIT) PO CAPS
50000.0000 [IU] | ORAL_CAPSULE | ORAL | Status: DC
  Administered 2022-04-19: 50000 [IU] via ORAL
  Filled 2022-04-18: qty 1

## 2022-04-18 MED ORDER — CALCIUM CITRATE-VITAMIN D3 315-6.25 MG-MCG PO TABS: ORAL_TABLET | Freq: Every day | ORAL | Status: DC

## 2022-04-18 MED ORDER — AMISULPRIDE (ANTIEMETIC) 5 MG/2ML IV SOLN: 10.0000 mg | Freq: Once | INTRAVENOUS | Status: DC | PRN

## 2022-04-18 MED ORDER — METFORMIN HCL ER 500 MG PO TB24
2000.0000 mg | ORAL_TABLET | Freq: Every day | ORAL | Status: DC
  Administered 2022-04-19 – 2022-04-22 (×4): 2000 mg via ORAL
  Filled 2022-04-18 (×4): qty 4

## 2022-04-18 MED ORDER — DEXAMETHASONE SODIUM PHOSPHATE 10 MG/ML IJ SOLN
INTRAMUSCULAR | Status: DC | PRN
Start: 2022-04-18 — End: 2022-04-18
  Administered 2022-04-18: 8 mg via INTRAVENOUS

## 2022-04-18 MED ORDER — FENTANYL CITRATE PF 50 MCG/ML IJ SOSY: 25.0000 ug | PREFILLED_SYRINGE | INTRAMUSCULAR | Status: DC | PRN

## 2022-04-18 MED ORDER — PROMETHAZINE HCL 25 MG PO TABS: 25.0000 mg | ORAL_TABLET | Freq: Four times a day (QID) | ORAL | Status: DC | PRN

## 2022-04-18 MED ORDER — ACETAMINOPHEN 325 MG PO TABS
325.0000 mg | ORAL_TABLET | Freq: Four times a day (QID) | ORAL | Status: DC | PRN
  Administered 2022-04-19: 325 mg via ORAL
  Administered 2022-04-20 – 2022-04-22 (×5): 650 mg via ORAL
  Filled 2022-04-18 (×4): qty 2
  Filled 2022-04-18: qty 1
  Filled 2022-04-18: qty 2

## 2022-04-18 MED ORDER — MIDAZOLAM HCL 2 MG/2ML IJ SOLN
1.0000 mg | INTRAMUSCULAR | Status: DC
  Administered 2022-04-18: 1 mg via INTRAVENOUS
  Filled 2022-04-18: qty 2

## 2022-04-18 MED ORDER — SODIUM CHLORIDE 0.9 % IV SOLN: INTRAVENOUS | Status: DC

## 2022-04-18 MED ORDER — SEMAGLUTIDE 14 MG PO TABS: 14.0000 mg | ORAL_TABLET | Freq: Every day | ORAL | Status: DC

## 2022-04-18 MED ORDER — PHENYLEPHRINE 80 MCG/ML (10ML) SYRINGE FOR IV PUSH (FOR BLOOD PRESSURE SUPPORT)
PREFILLED_SYRINGE | INTRAVENOUS | Status: AC
  Filled 2022-04-18: qty 40

## 2022-04-18 MED ORDER — METOCLOPRAMIDE HCL 5 MG PO TABS: 5.0000 mg | ORAL_TABLET | Freq: Three times a day (TID) | ORAL | Status: DC | PRN

## 2022-04-18 MED ORDER — MIRTAZAPINE 15 MG PO TABS
15.0000 mg | ORAL_TABLET | Freq: Every day | ORAL | Status: DC
  Administered 2022-04-18 – 2022-04-21 (×4): 15 mg via ORAL
  Filled 2022-04-18 (×4): qty 1

## 2022-04-18 MED ORDER — PROMETHAZINE HCL 25 MG/ML IJ SOLN: 6.2500 mg | INTRAMUSCULAR | Status: DC | PRN

## 2022-04-18 MED ORDER — ROSUVASTATIN CALCIUM 5 MG PO TABS
5.0000 mg | ORAL_TABLET | Freq: Every day | ORAL | Status: DC
  Administered 2022-04-19 – 2022-04-22 (×4): 5 mg via ORAL
  Filled 2022-04-18 (×4): qty 1

## 2022-04-18 MED ORDER — FAMOTIDINE 20 MG PO TABS
20.0000 mg | ORAL_TABLET | Freq: Every day | ORAL | Status: DC
  Administered 2022-04-18 – 2022-04-22 (×5): 20 mg via ORAL
  Filled 2022-04-18 (×5): qty 1

## 2022-04-18 MED ORDER — LACTATED RINGERS IV SOLN
INTRAVENOUS | Status: DC
Start: 2022-04-18 — End: 2022-04-18

## 2022-04-18 MED ORDER — OXYCODONE HCL 5 MG PO TABS
10.0000 mg | ORAL_TABLET | ORAL | Status: DC | PRN
  Administered 2022-04-21: 10 mg via ORAL
  Filled 2022-04-18: qty 2

## 2022-04-18 MED ORDER — CEFAZOLIN SODIUM-DEXTROSE 2-4 GM/100ML-% IV SOLN
2.0000 g | INTRAVENOUS | Status: AC
  Administered 2022-04-18: 2 g via INTRAVENOUS
  Filled 2022-04-18: qty 100

## 2022-04-18 MED ORDER — BUPIVACAINE LIPOSOME 1.3 % IJ SUSP
INTRAMUSCULAR | Status: DC | PRN
  Administered 2022-04-18: 10 mL via PERINEURAL

## 2022-04-18 MED ORDER — ONDANSETRON HCL 4 MG PO TABS: 4.0000 mg | ORAL_TABLET | Freq: Four times a day (QID) | ORAL | Status: DC | PRN

## 2022-04-18 MED ORDER — OXYCODONE-ACETAMINOPHEN 5-325 MG PO TABS: 1.0000 | ORAL_TABLET | ORAL | 0 refills | Status: DC | PRN

## 2022-04-18 MED ORDER — ACCU-CHEK FASTCLIX LANCETS MISC: 1.0000 | Freq: Every day | Status: DC

## 2022-04-18 MED ORDER — PHENYLEPHRINE HCL-NACL 20-0.9 MG/250ML-% IV SOLN
INTRAVENOUS | Status: AC
  Filled 2022-04-18: qty 250

## 2022-04-18 MED ORDER — ORAL CARE MOUTH RINSE: 15.0000 mL | Freq: Once | OROMUCOSAL | Status: AC

## 2022-04-18 MED ORDER — OXYCODONE HCL 5 MG PO TABS: 5.0000 mg | ORAL_TABLET | Freq: Once | ORAL | Status: DC | PRN

## 2022-04-18 SURGICAL SUPPLY — 79 items
AID PSTN UNV HD RSTRNT DISP (MISCELLANEOUS) ×1
BAG COUNTER SPONGE SURGICOUNT (BAG) ×1 IMPLANT
BAG SPEC THK2 15X12 ZIP CLS (MISCELLANEOUS) ×1
BAG SPNG CNTER NS LX DISP (BAG) ×1
BAG ZIPLOCK 12X15 (MISCELLANEOUS) ×1 IMPLANT
BIT DRILL 1.6MX128 (BIT) IMPLANT
BIT DRILL 170X2.5X (BIT) IMPLANT
BIT DRL 170X2.5X (BIT) ×1
BLADE SAG 18X100X1.27 (BLADE) ×2 IMPLANT
COVER BACK TABLE 60X90IN (DRAPES) ×2 IMPLANT
COVER SURGICAL LIGHT HANDLE (MISCELLANEOUS) ×2 IMPLANT
DRAPE INCISE IOBAN 66X45 STRL (DRAPES) ×2 IMPLANT
DRAPE ORTHO SPLIT 77X108 STRL (DRAPES) ×4
DRAPE SHEET LG 3/4 BI-LAMINATE (DRAPES) ×2 IMPLANT
DRAPE SURG ORHT 6 SPLT 77X108 (DRAPES) ×2 IMPLANT
DRAPE TOP 10253 STERILE (DRAPES) ×2 IMPLANT
DRAPE U-SHAPE 47X51 STRL (DRAPES) ×2 IMPLANT
DRILL 2.5 (BIT) ×2
DRSG ADAPTIC 3X8 NADH LF (GAUZE/BANDAGES/DRESSINGS) ×2 IMPLANT
DRSG PAD ABDOMINAL 8X10 ST (GAUZE/BANDAGES/DRESSINGS) ×2 IMPLANT
DURAPREP 26ML APPLICATOR (WOUND CARE) ×2 IMPLANT
ELECT BLADE TIP CTD 4 INCH (ELECTRODE) ×2 IMPLANT
ELECT NDL TIP 2.8 STRL (NEEDLE) ×1 IMPLANT
ELECT NEEDLE TIP 2.8 STRL (NEEDLE) ×2 IMPLANT
ELECT PENCIL ROCKER SW 15FT (MISCELLANEOUS) ×1 IMPLANT
ELECT REM PT RETURN 15FT ADLT (MISCELLANEOUS) ×2 IMPLANT
EPI LT SZ 1 (Orthopedic Implant) ×2 IMPLANT
EPIPHYSIS LT SZ 1 (Orthopedic Implant) IMPLANT
FACESHIELD WRAPAROUND (MASK) ×2 IMPLANT
FACESHIELD WRAPAROUND OR TEAM (MASK) ×1 IMPLANT
GAUZE SPONGE 4X4 12PLY STRL (GAUZE/BANDAGES/DRESSINGS) ×2 IMPLANT
GLENOSPHERE DELTA XTEND LAT 38 (Miscellaneous) ×1 IMPLANT
GLOVE BIOGEL PI IND STRL 7.5 (GLOVE) ×1 IMPLANT
GLOVE BIOGEL PI IND STRL 8.5 (GLOVE) ×1 IMPLANT
GLOVE BIOGEL PI INDICATOR 7.5 (GLOVE) ×1
GLOVE BIOGEL PI INDICATOR 8.5 (GLOVE) ×1
GLOVE ORTHO TXT STRL SZ7.5 (GLOVE) ×2 IMPLANT
GLOVE SURG ORTHO 8.5 STRL (GLOVE) ×2 IMPLANT
GOWN STRL REUS W/ TWL XL LVL3 (GOWN DISPOSABLE) ×2 IMPLANT
GOWN STRL REUS W/TWL XL LVL3 (GOWN DISPOSABLE) ×4
KIT BASIN OR (CUSTOM PROCEDURE TRAY) ×2 IMPLANT
KIT TURNOVER KIT A (KITS) IMPLANT
MANIFOLD NEPTUNE II (INSTRUMENTS) ×2 IMPLANT
METAGLENE DELTA EXTEND (Trauma) IMPLANT
METAGLENE DXTEND (Trauma) ×2 IMPLANT
NDL MAYO CATGUT SZ4 TPR NDL (NEEDLE) ×1 IMPLANT
NEEDLE MAYO CATGUT SZ4 (NEEDLE) ×2 IMPLANT
NS IRRIG 1000ML POUR BTL (IV SOLUTION) ×2 IMPLANT
PACK SHOULDER (CUSTOM PROCEDURE TRAY) ×2 IMPLANT
PIN GUIDE 1.2 (PIN) ×1 IMPLANT
PIN GUIDE GLENOPHERE 1.5MX300M (PIN) ×1 IMPLANT
PIN METAGLENE 2.5 (PIN) ×1 IMPLANT
PROTECTOR NERVE ULNAR (MISCELLANEOUS) ×2 IMPLANT
RESTRAINT HEAD UNIVERSAL NS (MISCELLANEOUS) ×2 IMPLANT
SCREW 4.5X18MM (Screw) ×2 IMPLANT
SCREW 48L (Screw) ×1 IMPLANT
SCREW BN 18X4.5XSTRL SHLDR (Screw) IMPLANT
SCREW LOCK DELTA XTEND 4.5X30 (Screw) ×1 IMPLANT
SLING ARM FOAM STRAP LRG (SOFTGOODS) IMPLANT
SLING ARM FOAM STRAP MED (SOFTGOODS) ×1 IMPLANT
SMARTMIX MINI TOWER (MISCELLANEOUS)
SPACER 38 PLUS 3 (Spacer) ×1 IMPLANT
SPIKE FLUID TRANSFER (MISCELLANEOUS) ×2 IMPLANT
SPONGE T-LAP 4X18 ~~LOC~~+RFID (SPONGE) ×1 IMPLANT
STEM DELTA DIA 10 HA (Stem) ×1 IMPLANT
STRIP CLOSURE SKIN 1/2X4 (GAUZE/BANDAGES/DRESSINGS) ×2 IMPLANT
SUCTION FRAZIER HANDLE 10FR (MISCELLANEOUS) ×2
SUCTION TUBE FRAZIER 10FR DISP (MISCELLANEOUS) ×1 IMPLANT
SUT FIBERWIRE #2 38 T-5 BLUE (SUTURE) ×4
SUT MNCRL AB 4-0 PS2 18 (SUTURE) ×2 IMPLANT
SUT VIC AB 0 CT1 36 (SUTURE) ×4 IMPLANT
SUT VIC AB 0 CT2 27 (SUTURE) ×2 IMPLANT
SUT VIC AB 2-0 CT1 27 (SUTURE) ×2
SUT VIC AB 2-0 CT1 TAPERPNT 27 (SUTURE) ×1 IMPLANT
SUTURE FIBERWR #2 38 T-5 BLUE (SUTURE) ×2 IMPLANT
TAPE CLOTH SURG 6X10 WHT LF (GAUZE/BANDAGES/DRESSINGS) ×1 IMPLANT
TOWEL OR 17X26 10 PK STRL BLUE (TOWEL DISPOSABLE) ×2 IMPLANT
TOWER SMARTMIX MINI (MISCELLANEOUS) IMPLANT
WATER STERILE IRR 1000ML POUR (IV SOLUTION) ×2 IMPLANT

## 2022-04-18 NOTE — Anesthesia Procedure Notes (Signed)
Procedure Name: Intubation Date/Time: 04/18/2022 12:23 PM Performed by: Jonna Munro, CRNA Pre-anesthesia Checklist: Patient identified, Emergency Drugs available, Suction available, Patient being monitored and Timeout performed Patient Re-evaluated:Patient Re-evaluated prior to induction Oxygen Delivery Method: Circle system utilized Preoxygenation: Pre-oxygenation with 100% oxygen Induction Type: IV induction Laryngoscope Size: Mac and 3 Grade View: Grade I Tube type: Oral Tube size: 7.0 mm Number of attempts: 1 Airway Equipment and Method: Stylet Placement Confirmation: ETT inserted through vocal cords under direct vision, positive ETCO2 and breath sounds checked- equal and bilateral Secured at: 22 cm Tube secured with: Tape Dental Injury: Teeth and Oropharynx as per pre-operative assessment

## 2022-04-18 NOTE — Interval H&P Note (Signed)
History and Physical Interval Note:  04/18/2022 11:39 AM  [name]  has presented today for surgery, with the diagnosis of LEFT SHOULDER CUFF TEAR.  The various methods of treatment have been discussed with the patient and family. After consideration of risks, benefits and other options for treatment, the patient has consented to  Procedure(s) with comments: REVERSE SHOULDER ARTHROPLASTY (Left) - with ISB as a surgical intervention.  The patient's history has been reviewed, patient examined, no change in status, stable for surgery.  I have reviewed the patient's chart and labs.  Questions were answered to the patient's satisfaction.     Augustin Schooling

## 2022-04-18 NOTE — Care Plan (Signed)
Ortho Bundle Case Management Note  Patient Details  Name: Amanpreet Delmont MRN: [id number] Date of Birth: 06-May-1941                  L Rev TSA on 04/18/22. DCP: Home with friend. DME: Sling and ice machine given at hospital. PT: HEP   DME Arranged:  N/A DME Agency:     HH Arranged:    [place] Agency:     Additional Comments: Please contact me with any questions of if this plan should need to change.  Marianne Sofia, RN,CCM [facility]  [phone] 04/18/2022, 12:19 PM

## 2022-04-18 NOTE — Progress Notes (Signed)
Contacted patient to arrive earlier for surgery today.  Patient stated that her ride is coming at 0930 and that they will come straight here

## 2022-04-18 NOTE — Transfer of Care (Signed)
Immediate Anesthesia Transfer of Care Note  Patient: [name]  Procedure(s) Performed: REVERSE SHOULDER ARTHROPLASTY (Left: Shoulder)  Patient Location: PACU  Anesthesia Type:General  Level of Consciousness: awake, alert  and patient cooperative  Airway & Oxygen Therapy: Patient Spontanous Breathing and Patient connected to face mask oxygen  Post-op Assessment: Report given to RN and Post -op Vital signs reviewed and stable  Post vital signs: Reviewed and stable  Last Vitals:  Vitals Value Taken Time  BP    Temp    Pulse 87 04/18/22 1350  Resp 14 04/18/22 1350  SpO2 100 % 04/18/22 1350  Vitals shown include unvalidated device data.  Last Pain:  Vitals:   04/18/22 1031  TempSrc:   PainSc: 7       Patients Stated Pain Goal: 3 (62/69/48 [id number])  Complications: No notable events documented.

## 2022-04-18 NOTE — Anesthesia Procedure Notes (Signed)
Anesthesia Regional Block: Interscalene brachial plexus block   Pre-Anesthetic Checklist: , timeout performed,  Correct Patient, Correct Site, Correct Laterality,  Correct Procedure, Correct Position, site marked,  Risks and benefits discussed,  Surgical consent,  Pre-op evaluation,  At surgeon's request and post-op pain management  Laterality: Left  Prep: chloraprep       Needles:  Injection technique: Single-shot  Needle Type: Stimiplex     Needle Length: 9cm  Needle Gauge: 21     Additional Needles:   Procedures:,,,, ultrasound used (permanent image in chart),,    Narrative:  Start time: 04/18/2022 11:03 AM End time: 04/18/2022 11:08 AM Injection made incrementally with aspirations every 5 mL.  Performed by: Personally  Anesthesiologist: Lynda Rainwater, MD

## 2022-04-18 NOTE — Discharge Instructions (Signed)
Ice to the shoulder constantly.  Keep the incision covered and clean and dry for one week, then ok to get it wet in the shower. ° °Do exercise as instructed several times per day. ° °DO NOT reach behind your back or push up out of a chair with the operative arm. ° °Use a sling while you are up and around for comfort, may remove while seated.  Keep pillow propped behind the operative elbow. ° °Follow up with Dr Cadden Elizondo in two weeks in the office, call [phone] for appt °

## 2022-04-18 NOTE — Anesthesia Postprocedure Evaluation (Signed)
Anesthesia Post Note  Patient: [name]  Procedure(s) Performed: REVERSE SHOULDER ARTHROPLASTY (Left: Shoulder)     Patient location during evaluation: PACU Anesthesia Type: General Level of consciousness: awake and alert Pain management: pain level controlled Vital Signs Assessment: post-procedure vital signs reviewed and stable Respiratory status: spontaneous breathing, nonlabored ventilation and respiratory function stable Cardiovascular status: blood pressure returned to baseline and stable Postop Assessment: no apparent nausea or vomiting Anesthetic complications: no   No notable events documented.  Last Vitals:  Vitals:   04/18/22 1350 04/18/22 1430  BP: 119/62 (!) 105/56  Pulse: 89 80  Resp: 12 11  Temp: 37.2 C   SpO2: 96% 99%    Last Pain:  Vitals:   04/18/22 1430  TempSrc:   PainSc: 0-No pain                 Lynda Rainwater

## 2022-04-18 NOTE — Op Note (Signed)
[name], [name] MEDICAL RECORD NO: [id number] ACCOUNT NO: [id number] DATE OF BIRTH: 28-Mar-1941 FACILITY: [facility] LOCATION: [facility] PHYSICIAN: Jennifer Heater. Veverly Fells, MD  Operative Report   DATE OF PROCEDURE: 04/18/2022  PREOPERATIVE DIAGNOSIS:  Left shoulder rotator cuff tear with shoulder pain.  POSTOPERATIVE DIAGNOSES:  Left shoulder rotator cuff tear with shoulder pain.  PROCEDURE PERFORMED:  Left reverse shoulder replacement using DePuy Delta Xtend prosthesis with no subscap repair.  ATTENDING SURGEON:  Jennifer Heater. Veverly Fells, MD  ASSISTANT:  [name], [place], who was scrubbed during the entire procedure and necessary for satisfactory completion of surgery.  ANESTHESIA:  General anesthesia was used plus interscalene block.  ESTIMATED BLOOD LOSS:  Less than 150 mL  FLUID REPLACEMENT:  1000 mL crystalloid.  INSTRUMENT COUNTS:  Correct.  COMPLICATIONS:  No complications.  ANTIBIOTICS:  Perioperative antibiotics were given.  INDICATIONS:  The patient is a 81 year old female who presents with a history of left shoulder pain, which is secondary to a significant rotator cuff tear.  The patient has had declining function and significant pain interfering with rest and with daily  activities.  We discussed options including repair of her rotator cuff, which at 47 there is a chance it may not heal plus with the patient living alone, she needs her arms for independence.  We discussed reverse shoulder replacement, which would  eliminate pain and restore fixed focal mechanics to the shoulder and essentially obviate the need for her having those rotator cuff tendons repaired.  She wanted to move forward with a reverse shoulder replacement as we recommended, which would be a more  reliable result and allow her to use her arm more quickly and not have to be in a shoulder abduction pillow sling.  The patient agreed with this plan.  Informed consent obtained.  DESCRIPTION OF PROCEDURE:  After an  adequate level of anesthesia was achieved, the patient was positioned in modified beach chair position.  Left shoulder correctly identified and sterilely prepped and draped in the usual manner.  Timeout called,  verifying correct patient, correct site. We entered the patient's shoulder using standard deltopectoral approach, starting at the coracoid process extending down the anterior humerus.  Dissection down through subcutaneous tissues using Bovie  electrocautery.  We identified the cephalic vein and took that laterally with the deltoid.  Pectoralis was taken medially.  Conjoined tendon identified and retracted medially.  Biceps tenodesed in situ with 0 Vicryl figure-of-eight suture, incorporating  part of the pec tendon.  We then released the subscapularis remnant off the lesser tuberosity.  This was quite thinned and felt not to be sufficient for repair at the end.  We did tag it for protection of the axillary nerve.  We released the inferior  capsule progressively externally rotating and extending the shoulder.  The humeral head delivered out the wound.  There was actually a decent amount of chondromalacia on the humeral head and significant interstitial tearing of the rotator cuff all the  way back to the infraspinatus.  We left the posterior infraspinatus and teres minor intact.  We then entered the proximal humerus with a 6 mm reamer, reaming up to a size 10.  We then placed our 10 mm T-handle guide and resected the humeral head with the  oscillating saw at 20 degrees of retroversion.  We used a rongeur, removed excess osteophytes medially.  We then subluxed the humerus posteriorly, gaining good exposure of the glenoid face.  We removed the capsule and the labrum, carefully protected the  axillary nerve.  We then found our center point, drilled our guide pin centered low on the glenoid face.  We reamed for the metaglene baseplate, did our peripheral hand reaming.  We then drilled out the central peg  hole.  We then impacted the metaglene  into position.  We placed a 48 screw inferiorly, a 30 screw at the base of the coracoid and 18 nonlocked posteriorly.  We had good baseplate security.  At this point, we selected the real 38 standard glenosphere and placed that onto the baseplate and  secured it with a screwdriver.  We irrigated thoroughly.  We then went ahead and went back to the humeral side and reamed for the one left metaphysis and then trialled with the 10 stem with 1 left metaphysis set on the 0 setting and placed in 20 degrees  of retroversion.  We used a 38+3 poly trial, reduced the shoulder and we were happy with our soft tissue balancing. We removed all trial components, selected the press-fit HA coated 10 stem and a 1 left metaphysis, set it on the 0 setting and then with  the available bone graft from the humeral head, we used impaction grafting technique and impacted the press-fit stem into place in about 20 degrees of retroversion. With the stem secured, we went ahead and selected the real 38+3 poly and impacted that on  the humeral tray and reduced the shoulder with good stability and excellent range of motion, no impingement.  We irrigated thoroughly. Resected the subscap remnant and then repaired deltopectoral interval with 0 Vicryl suture, followed by 2-0 Vicryl for  subcutaneous closure and 4-0 Monocryl for skin.  Steri-Strips applied followed by sterile dressing.  The patient tolerated surgery well.   VAI D: 04/18/2022 2:17:37 pm T: 04/18/2022 11:09:00 pm  JOB: [id number]/ [id number]

## 2022-04-18 NOTE — Anesthesia Preprocedure Evaluation (Signed)
Anesthesia Evaluation  Patient identified by MRN, date of birth, ID band Patient awake    Reviewed: Allergy & Precautions, NPO status , Patient's Chart, lab work & pertinent test results  Airway Mallampati: II  TM Distance: >3 FB Neck ROM: Full    Dental no notable dental hx.    Pulmonary neg pulmonary ROS, former smoker,    Pulmonary exam normal breath sounds clear to auscultation       Cardiovascular hypertension, Pt. on medications and Pt. on home beta blockers Normal cardiovascular exam Rhythm:Regular Rate:Normal     Neuro/Psych Anxiety Depression negative neurological ROS  negative psych ROS   GI/Hepatic negative GI ROS, Neg liver ROS,   Endo/Other  diabetes  Renal/GU negative Renal ROS  negative genitourinary   Musculoskeletal negative musculoskeletal ROS (+)   Abdominal (+) + obese,   Peds negative pediatric ROS (+)  Hematology negative hematology ROS (+)   Anesthesia Other Findings   Reproductive/Obstetrics negative OB ROS                             Anesthesia Physical  Anesthesia Plan  ASA: III  Anesthesia Plan: General   Post-op Pain Management: Regional block*, Dilaudid IV and Minimal or no pain anticipated   Induction: Intravenous  PONV Risk Score and Plan: 3 and Ondansetron, Treatment may vary due to age or medical condition, Dexamethasone and Midazolam  Airway Management Planned: Oral ETT  Additional Equipment:   Intra-op Plan:   Post-operative Plan: Extubation in OR  Informed Consent: I have reviewed the patients History and Physical, chart, labs and discussed the procedure including the risks, benefits and alternatives for the proposed anesthesia with the patient or authorized representative who has indicated his/her understanding and acceptance.     Dental advisory given  Plan Discussed with: CRNA and Surgeon  Anesthesia Plan Comments:          Anesthesia Quick Evaluation

## 2022-04-18 NOTE — Progress Notes (Signed)
Assisted Dr. Sabra Heck with left, interscalene  block. Side rails up, monitors on throughout procedure. See vital signs in flow sheet. Tolerated Procedure well.

## 2022-04-18 NOTE — Brief Op Note (Signed)
04/18/2022  2:10 PM  PATIENT:  [name]  81 y.o. female  PRE-OPERATIVE DIAGNOSIS:  LEFT SHOULDER ROTATOR CUFF TEAR, pain  POST-OPERATIVE DIAGNOSIS:  LEFT SHOULDER ROTATOR CUFF TEAR, pain  PROCEDURE:  Procedure(s) with comments: REVERSE SHOULDER ARTHROPLASTY (Left) - with ISB DePuy delta Xtend with no subscap repair  SURGEON:  Surgeon(s) and Role:    Netta Cedars, MD - Primary  PHYSICIAN ASSISTANT:   ASSISTANTS: [place] Bruns, PA-C   ANESTHESIA:   regional and general  EBL:  150 mL   BLOOD ADMINISTERED:none  DRAINS: none   LOCAL MEDICATIONS USED:  MARCAINE     SPECIMEN:  No Specimen  DISPOSITION OF SPECIMEN:  N/A  COUNTS:  YES  TOURNIQUET:  * No tourniquets in log *  DICTATION: .Other Dictation: Dictation Number [id number]  PLAN OF CARE: Admit to inpatient  PATIENT DISPOSITION:  PACU - hemodynamically stable.   Delay start of Pharmacological VTE agent (>24hrs) due to surgical blood loss or risk of bleeding: not applicable

## 2022-04-19 LAB — BASIC METABOLIC PANEL
Anion gap: 6 (ref 5–15)
BUN: 10 mg/dL (ref 8–23)
CO2: 27 mmol/L (ref 22–32)
Calcium: 8.8 mg/dL — ABNORMAL LOW (ref 8.9–10.3)
Chloride: 109 mmol/L (ref 98–111)
Creatinine, Ser: 0.62 mg/dL (ref 0.44–1.00)
GFR, Estimated: >60 mL/min (ref >60)
Glucose, Bld: 224 mg/dL — ABNORMAL HIGH (ref 70–99)
Potassium: 4 mmol/L (ref 3.5–5.1)
Sodium: 142 mmol/L (ref 135–145)

## 2022-04-19 LAB — GLUCOSE, CAPILLARY
Glucose-Capillary: 219 mg/dL — ABNORMAL HIGH (ref 70–99)
Glucose-Capillary: 151 mg/dL — ABNORMAL HIGH (ref 70–99)
Glucose-Capillary: 108 mg/dL — ABNORMAL HIGH (ref 70–99)
Glucose-Capillary: 204 mg/dL — ABNORMAL HIGH (ref 70–99)

## 2022-04-19 LAB — HEMOGLOBIN AND HEMATOCRIT, BLOOD
HCT: 33 % — ABNORMAL LOW (ref 36.0–46.0)
Hemoglobin: 10.3 g/dL — ABNORMAL LOW (ref 12.0–15.0)

## 2022-04-19 NOTE — Progress Notes (Signed)
PT Cancellation Note  Patient Details Name: [name] MRN: [id number] DOB: 25-Apr-1941   Cancelled Treatment:    Reason Eval/Treat Not Completed: PT screened, no needs identified, will sign off (No needs for acute PT, pt is mobilizing independently in room with no device.)  Gwynneth Albright PT, DPT Acute Rehabilitation Services Office [phone] Pager [phone]  04/19/22 10:00 AM

## 2022-04-19 NOTE — Progress Notes (Signed)
   Subjective: 1 Day Post-Op Procedure(s) (LRB): REVERSE SHOULDER ARTHROPLASTY (Left) Patient reports pain as mild.   Patient seen in rounds for Dr. Veverly Fells. Patient is sitting at the side of the bed on exam this morning. She reports her fingers have been tingling as they wake up. No acute events overnight. She tells me Dr. Veverly Fells indicated she may go home on Sunday. She has not had a bowel movement yet.  We will start therapy today.   Objective: Vital signs in last 24 hours: Temp:  [98.1 F (36.7 C)-99 F (37.2 C)] 98.5 F (36.9 C) (05/20 0546) Pulse Rate:  [80-92] 80 (05/20 0546) Resp:  [11-18] 16 (05/20 0546) BP: (105-151)/(56-86) 132/66 (05/20 0546) SpO2:  [89 %-99 %] 96 % (05/20 0546)  Intake/Output from previous day:  Intake/Output Summary (Last 24 hours) at 04/19/2022 1103 Last data filed at 04/19/2022 0600 Gross per 24 hour  Intake 2120.06 ml  Output 150 ml  Net 1970.06 ml     Intake/Output this shift: No intake/output data recorded.  Labs: Recent Labs    04/19/22 0326  HGB 10.3*   Recent Labs    04/19/22 0326  HCT 33.0*   Recent Labs    04/19/22 0326  NA 142  K 4.0  CL 109  CO2 27  BUN 10  CREATININE 0.62  GLUCOSE 224*  CALCIUM 8.8*   No results for input(s): LABPT, INR in the last 72 hours.  Exam: General - Patient is Alert and Oriented Extremity - Neurologically intact Sensation intact distally Intact pulses distally Dressing - dressing C/D/I Motor Function - intact, moving fingers well on exam.   Past Medical History:  Diagnosis Date   Anxiety    Arthritis    Cancer ([place])    Depression    Diabetes mellitus without complication ([place])    Hypertension    Hypothyroidism     Assessment/Plan: 1 Day Post-Op Procedure(s) (LRB): REVERSE SHOULDER ARTHROPLASTY (Left) Principal Problem:   H/O total shoulder replacement, left  Estimated body mass index is 33.07 kg/m as calculated from the following:   Height as of this encounter: '5\' 1"'$   (1.549 m).   Weight as of this encounter: 79.4 kg. Advance diet Up with therapy   Weight bearing as tolerated.  Up with PT today.  Continue pain management  Possible discharge home tomorrow pending progress.   Griffith Citron, PA-C Orthopedic Surgery [phone] 04/19/2022, 11:03 AM

## 2022-04-19 NOTE — Evaluation (Addendum)
Occupational Therapy Evaluation Patient Details Name: [name] MRN: [id number] DOB: 1940-12-27 Today's Date: 04/19/2022   History of Present Illness Patient s/p left reverse TSA.   Clinical Impression   [name] is a 81 year old woman s/p shoulder replacement without functional use of let non- dominant upper extremity secondary to effects of surgery and interscalene block and shoulder precautions. Therapist provided education and instruction to patient in regards to exercises, precautions, positioning, donning upper extremity clothing and bathing while maintaining shoulder precautions, and donning/doffing sling. Patient verbalized understanding and handout provided to maximize retention of education. Patient needed min assistance to donn nightgown and sling. She reports she won't have decent assistance at home until Monday. Will continue to follow to maximize independence and adherence to shoulder precautions prior to return home. Patient to follow up with MD for further therapy needs.        Recommendations for follow up therapy are one component of a multi-disciplinary discharge planning process, led by the attending physician.  Recommendations may be updated based on patient status, additional functional criteria and insurance authorization.   Follow Up Recommendations  Follow physician's recommendations for discharge plan and follow up therapies    Assistance Recommended at Discharge Intermittent Supervision/Assistance  Patient can return home with the following A little help with bathing/dressing/bathroom;Assistance with cooking/housework    Functional Status Assessment  Patient has had a recent decline in their functional status and demonstrates the ability to make significant improvements in function in a reasonable and predictable amount of time.  Equipment Recommendations  None recommended by OT    Recommendations for Other Services       Precautions /  Restrictions Precautions Precautions: Shoulder Type of Shoulder Precautions: No AROM, No PROM, elbow,wrist and hand okay for AROM Shoulder Interventions: Shoulder sling/immobilizer;Off for dressing/bathing/exercises Precaution Booklet Issued:  (handouts) Required Braces or Orthoses: Sling Restrictions Weight Bearing Restrictions: Yes LUE Weight Bearing: Non weight bearing      Mobility Bed Mobility               General bed mobility comments: OOB    Transfers Overall transfer level: Needs assistance                 General transfer comment: Supervision for ambulation      Balance Overall balance assessment: Mild deficits observed, not formally tested                                         ADL either performed or assessed with clinical judgement   ADL Overall ADL's : Needs assistance/impaired Eating/Feeding: Set up   Grooming: Modified independent   Upper Body Bathing: Minimal assistance   Lower Body Bathing: Modified independent   Upper Body Dressing : Minimal assistance   Lower Body Dressing: Modified independent   Toilet Transfer: Modified Independent   Toileting- Clothing Manipulation and Hygiene: Modified independent       Functional mobility during ADLs: Supervision/safety       Vision Patient Visual Report: No change from baseline       Perception     Praxis      Pertinent Vitals/Pain Pain Assessment Pain Assessment: No/denies pain     Hand Dominance Right   Extremity/Trunk Assessment Upper Extremity Assessment Upper Extremity Assessment: LUE deficits/detail RUE Deficits / Details: WFL ROM and strength RUE Sensation: WNL RUE Coordination: WNL LUE Deficits / Details:  Impaired motor control and sensation secondary to block   Lower Extremity Assessment Lower Extremity Assessment: Overall WFL for tasks assessed   Cervical / Trunk Assessment Cervical / Trunk Assessment: Normal   Communication  Communication Communication: No difficulties   Cognition Arousal/Alertness: Awake/alert Behavior During Therapy: WFL for tasks assessed/performed Overall Cognitive Status: Within Functional Limits for tasks assessed                                       General Comments       Exercises     Shoulder Instructions      Home Living Family/patient expects to be discharged to:: Private residence Living Arrangements: Non-relatives/Friends Available Help at Discharge: Friend(s);Available PRN/intermittently Type of Home: Apartment       Home Layout: One level     Bathroom Shower/Tub: Teacher, early years/pre: Standard     Home Equipment: Shower seat          Prior Functioning/Environment Prior Level of Function : Independent/Modified Independent                        OT Problem List: Decreased strength;Decreased range of motion;Impaired UE functional use;Pain;Decreased knowledge of precautions      OT Treatment/Interventions: Self-care/ADL training;Therapeutic exercise;DME and/or AE instruction;Therapeutic activities;Balance training;Patient/family education    OT Goals(Current goals can be found in the care plan section) Acute Rehab OT Goals Patient Stated Goal: be independent OT Goal Formulation: With patient Time For Goal Achievement: 05/02/22 Potential to Achieve Goals: Good  OT Frequency: Min 2X/week    Co-evaluation              AM-PAC OT "6 Clicks" Daily Activity     Outcome Measure Help from another person eating meals?: A Little Help from another person taking care of personal grooming?: None Help from another person toileting, which includes using toliet, bedpan, or urinal?: None Help from another person bathing (including washing, rinsing, drying)?: A Little Help from another person to put on and taking off regular upper body clothing?: A Little Help from another person to put on and taking off regular lower body  clothing?: None 6 Click Score: 21   End of Session Nurse Communication: Mobility status  Activity Tolerance: Patient tolerated treatment well Patient left: in chair;with call bell/phone within reach  OT Visit Diagnosis: Muscle weakness (generalized) (M62.81)                Time: 0071-2197 OT Time Calculation (min): 26 min Charges:  OT General Charges $OT Visit: 1 Visit OT Evaluation $OT Eval Low Complexity: 1 Low OT Treatments $Self Care/Home Management : 8-22 mins  Kelia Gibbon, OTR/L [place]  Office [phone] Pager: [phone]   Lenward Chancellor 04/19/2022, 10:13 AM

## 2022-04-19 NOTE — Progress Notes (Signed)
Transition of Care [facility]) Screening Note  Patient Details  Name: [name] Date of Birth: August 21, 1941  [facility] of [facility]) CM/SW Contact:    Sherie Don, LCSW Phone Number: 04/19/2022, 9:36 AM  [facility]) has reviewed patient and no TOC needs have been identified at this time. We will continue to monitor patient advancement through interdisciplinary progression rounds. If new patient transition needs arise, please place a TOC consult.

## 2022-04-20 LAB — GLUCOSE, CAPILLARY
Glucose-Capillary: 150 mg/dL — ABNORMAL HIGH (ref 70–99)
Glucose-Capillary: 112 mg/dL — ABNORMAL HIGH (ref 70–99)
Glucose-Capillary: 164 mg/dL — ABNORMAL HIGH (ref 70–99)
Glucose-Capillary: 155 mg/dL — ABNORMAL HIGH (ref 70–99)

## 2022-04-20 NOTE — Progress Notes (Addendum)
Subjective: 2 Days Post-Op Procedure(s) (LRB): REVERSE SHOULDER ARTHROPLASTY (Left) Patient seen in rounds this morning for Dr. Veverly Fells Patient reports pain as 7 on 0-10 scale.   Patient still has not had a bowel movement, she has had some flatus Reports a lot of pain with any kind of left arm movement Worked with occupational therapy yesterday  Objective: Vital signs in last 24 hours: Temp:  [97.7 F (36.5 C)-100.3 F (37.9 C)] 100.3 F (37.9 C) (05/21 0521) Pulse Rate:  [91-96] 96 (05/21 0521) Resp:  [16-18] 16 (05/21 0521) BP: (123-135)/(63-73) 123/65 (05/21 0521) SpO2:  [94 %-98 %] 94 % (05/21 0521)  Intake/Output from previous day: 05/20 0701 - 05/21 0700 In: 600 [P.O.:600] Out: -  Intake/Output this shift: Total I/O In: 120 [P.O.:120] Out: -   Recent Labs    04/19/22 0326  HGB 10.3*   Recent Labs    04/19/22 0326  HCT 33.0*   Recent Labs    04/19/22 0326  NA 142  K 4.0  CL 109  CO2 27  BUN 10  CREATININE 0.62  GLUCOSE 224*  CALCIUM 8.8*   No results for input(s): LABPT, INR in the last 72 hours.  On exam of her left shoulder incision sites healing up nicely, no obvious signs of infection, no effusion noted.  She does have some tenderness to palpation.  She is able to move all fingers, she is able make a full fist.  Neurovascularly intact in her left upper extremity.   Assessment/Plan: 2 Days Post-Op Procedure(s) (LRB): REVERSE SHOULDER ARTHROPLASTY (Left) Advance diet Continue working with occupational therapy Remain in sling New dressing applied today Follow-up in the office in 2 weeks. Hopeful for discharge today, as long as pain is controlled, but may need another day in hospital     Derrick Ravel [phone] 04/20/2022, 9:18 AM   I have seen the patient and agree with above.  Plan to keep her overnight for more therapy.  She is not quite safe to go home per therapy.   Esmond Plants MD

## 2022-04-20 NOTE — Progress Notes (Signed)
Occupational Therapy Treatment Patient Details Name: [name] MRN: [id number] DOB: 10/03/1941 Today's Date: 04/20/2022   History of present illness Patient s/p left reverse TSA.   OT comments  Patient reports increased pain and feeling poorly today. She is mildly unsteady on the way to the bathroom - holding on to the furniture or walls but with no overt loss of balance. She is still able to manage toileting on her own. Therapist reiterated shoulder precautions, positioning, exercises, and compensatory strategies. She needed min assist for UE bathing due to pain and donning sling. Patient will not have her arranged assistance at home until Monday and patient had a mild decline today so recommend she stay another night.     Recommendations for follow up therapy are one component of a multi-disciplinary discharge planning process, led by the attending physician.  Recommendations may be updated based on patient status, additional functional criteria and insurance authorization.    Follow Up Recommendations  Follow physician's recommendations for discharge plan and follow up therapies    Assistance Recommended at Discharge Intermittent Supervision/Assistance  Patient can return home with the following  A little help with bathing/dressing/bathroom;Assistance with cooking/housework   Equipment Recommendations  None recommended by OT    Recommendations for Other Services      Precautions / Restrictions Precautions Precautions: Shoulder Type of Shoulder Precautions: No AROM, No PROM, elbow,wrist and hand okay for AROM Shoulder Interventions: Shoulder sling/immobilizer;Off for dressing/bathing/exercises Precaution Booklet Issued: Yes (comment) Required Braces or Orthoses: Sling Restrictions Weight Bearing Restrictions: Yes LUE Weight Bearing: Non weight bearing       Mobility Bed Mobility Overal bed mobility: Needs Assistance Bed Mobility: Supine to Sit     Supine to sit:  Supervision          Transfers Overall transfer level: Needs assistance                 General transfer comment: Min guard today for in room ambulation without a device. Mildly unsteady and using hand holds today. Reports she doesn't feel good today.     Balance Overall balance assessment: Mild deficits observed, not formally tested                                         ADL either performed or assessed with clinical judgement   ADL Overall ADL's : Needs assistance/impaired Eating/Feeding: Set up       Upper Body Bathing: Minimal assistance Upper Body Bathing Details (indicate cue type and reason): min assist to wash underneath underearm due to pain - reiterated instructions on compensatory strategies and to not raise arm.             Toilet Transfer: Modified Dentist and Hygiene: Modified independent       Functional mobility during ADLs: Supervision/safety General ADL Comments: Reiterated all instructions and shoulder precautions.    Extremity/Trunk Assessment Upper Extremity Assessment LUE Deficits / Details: Exhibits grossly functional ROM of elbow,m wrist and hand   Lower Extremity Assessment Lower Extremity Assessment: Overall WFL for tasks assessed   Cervical / Trunk Assessment Cervical / Trunk Assessment: Normal    Vision Patient Visual Report: No change from baseline     Perception     Praxis      Cognition Arousal/Alertness: Awake/alert Behavior During Therapy: WFL for tasks assessed/performed Overall Cognitive Status: Within Functional Limits for tasks  assessed                                          Exercises Shoulder Exercises Elbow Flexion: AROM, 10 reps, Left Wrist Flexion: AROM, Left, 10 reps (forearm supination x 10) Digit Composite Flexion: AROM, Left, 10 reps    Shoulder Instructions Shoulder Instructions Donning/doffing shirt without moving  shoulder: Supervision/safety Method for sponge bathing under operated UE: Minimal assistance Donning/doffing sling/immobilizer: Supervision/safety Correct positioning of sling/immobilizer: Supervision/safety ROM for elbow, wrist and digits of operated UE: Supervision/safety Sling wearing schedule (on at all times/off for ADL's): Independent Proper positioning of operated UE when showering: Independent Dressing change: Independent Positioning of UE while sleeping: Independent     General Comments      Pertinent Vitals/ Pain       Pain Assessment Pain Assessment: 0-10 Pain Score: 7  Pain Location: L shoulder Pain Descriptors / Indicators: Aching, Sore, Grimacing, Restless Pain Intervention(s): Limited activity within patient's tolerance  Home Living                                          Prior Functioning/Environment              Frequency  Min 2X/week        Progress Toward Goals  OT Goals(current goals can now be found in the care plan section)  Progress towards OT goals: Progressing toward goals  Acute Rehab OT Goals Patient Stated Goal: less pain OT Goal Formulation: With patient Time For Goal Achievement: 05/02/22 Potential to Achieve Goals: Good  Plan Discharge plan remains appropriate    Co-evaluation                 AM-PAC OT "6 Clicks" Daily Activity     Outcome Measure   Help from another person eating meals?: A Little Help from another person taking care of personal grooming?: None Help from another person toileting, which includes using toliet, bedpan, or urinal?: None Help from another person bathing (including washing, rinsing, drying)?: A Little Help from another person to put on and taking off regular upper body clothing?: A Little Help from another person to put on and taking off regular lower body clothing?: None 6 Click Score: 21    End of Session    OT Visit Diagnosis: Muscle weakness (generalized)  (M62.81)   Activity Tolerance Patient limited by pain   Patient Left in chair;with call bell/phone within reach   Nurse Communication Mobility status        Time: 5883-2549 OT Time Calculation (min): 26 min  Charges: OT General Charges $OT Visit: 1 Visit OT Treatments $Self Care/Home Management : 23-37 mins  Rojelio Uhrich, OTR/L [place]  Office [phone] Pager: [phone]   Lenward Chancellor 04/20/2022, 9:39 AM

## 2022-04-21 LAB — CBC WITH DIFFERENTIAL/PLATELET
Abs Immature Granulocytes: 0.03 10*3/uL (ref 0.00–0.07)
Basophils Absolute: 0 10*3/uL (ref 0.0–0.1)
Basophils Relative: 1 %
Eosinophils Absolute: 0.2 10*3/uL (ref 0.0–0.5)
Eosinophils Relative: 3 %
HCT: 34.9 % — ABNORMAL LOW (ref 36.0–46.0)
Hemoglobin: 11 g/dL — ABNORMAL LOW (ref 12.0–15.0)
Immature Granulocytes: 0 %
Lymphocytes Relative: 24 %
Lymphs Abs: 1.7 10*3/uL (ref 0.7–4.0)
MCH: 29.2 pg (ref 26.0–34.0)
MCHC: 31.5 g/dL (ref 30.0–36.0)
MCV: 92.6 fL (ref 80.0–100.0)
Monocytes Absolute: 0.4 10*3/uL (ref 0.1–1.0)
Monocytes Relative: 5 %
Neutro Abs: 4.8 10*3/uL (ref 1.7–7.7)
Neutrophils Relative %: 67 %
Platelets: 202 10*3/uL (ref 150–400)
RBC: 3.77 MIL/uL — ABNORMAL LOW (ref 3.87–5.11)
RDW: 13.2 % (ref 11.5–15.5)
WBC: 7.2 10*3/uL (ref 4.0–10.5)
nRBC: 0 % (ref 0.0–0.2)

## 2022-04-21 LAB — BASIC METABOLIC PANEL
Anion gap: 9 (ref 5–15)
BUN: 12 mg/dL (ref 8–23)
CO2: 24 mmol/L (ref 22–32)
Calcium: 8.8 mg/dL — ABNORMAL LOW (ref 8.9–10.3)
Chloride: 102 mmol/L (ref 98–111)
Creatinine, Ser: 0.68 mg/dL (ref 0.44–1.00)
GFR, Estimated: >60 mL/min (ref >60)
Glucose, Bld: 153 mg/dL — ABNORMAL HIGH (ref 70–99)
Potassium: 4.4 mmol/L (ref 3.5–5.1)
Sodium: 135 mmol/L (ref 135–145)

## 2022-04-21 LAB — GLUCOSE, CAPILLARY
Glucose-Capillary: 152 mg/dL — ABNORMAL HIGH (ref 70–99)
Glucose-Capillary: 174 mg/dL — ABNORMAL HIGH (ref 70–99)
Glucose-Capillary: 143 mg/dL — ABNORMAL HIGH (ref 70–99)
Glucose-Capillary: 108 mg/dL — ABNORMAL HIGH (ref 70–99)

## 2022-04-21 MED ORDER — POLYETHYLENE GLYCOL 3350 17 G PO PACK
17.0000 g | PACK | Freq: Every day | ORAL | Status: DC
  Administered 2022-04-21 – 2022-04-22 (×2): 17 g via ORAL
  Filled 2022-04-21 (×2): qty 1

## 2022-04-21 MED ORDER — BISACODYL 10 MG RE SUPP
10.0000 mg | Freq: Every day | RECTAL | Status: DC | PRN
  Administered 2022-04-22: 10 mg via RECTAL
  Filled 2022-04-21: qty 1

## 2022-04-21 NOTE — Discharge Summary (Signed)
In most cases prophylactic antibiotics for Dental procdeures after total joint surgery are not necessary.  Exceptions are as follows:  1. History of prior total joint infection  2. Severely immunocompromised (Organ Transplant, cancer chemotherapy, Rheumatoid biologic meds such as [place])  3. Poorly controlled diabetes (A1C &gt; 8.0, blood glucose over 200)  If you have one of these conditions, contact your surgeon for an antibiotic prescription, prior to your dental procedure. Orthopedic Discharge Summary        Physician Discharge Summary  Patient ID: [name] MRN: [id number] DOB/AGE: August 23, 1941 81 y.o.  Admit date: 04/18/2022 Discharge date: 04/21/2022   Procedures:  Procedure(s) (LRB): REVERSE SHOULDER ARTHROPLASTY (Left)  Attending Physician:  Dr. Esmond Plants  Admission Diagnoses:   left shoulder cuff arthropathy  Discharge Diagnoses:  left shoulder cuff arthropathy   Past Medical History:  Diagnosis Date   Anxiety    Arthritis    Cancer ([name])    Depression    Diabetes mellitus without complication ([name])    Hypertension    Hypothyroidism     PCP: [name], MD   Discharged Condition: fair  Hospital Course:  Patient underwent the above stated procedure on 04/18/2022. Patient tolerated the procedure well and brought to the recovery room in good condition and subsequently to the floor. Patient had an uncomplicated hospital course and was stable for discharge.   Disposition: Discharge disposition: 01-Home or Self Care      with follow up in 2 weeks    Follow-up Information     Netta Cedars, MD. Schedule an appointment as soon as possible for a visit in 2 week(s).   Specialty: Orthopedic Surgery Why: Call [phone] to schedule appt in 2 weeks. Contact information: [address] [name] [address] [phone]                 Dental Antibiotics:  In most cases prophylactic antibiotics for Dental  procdeures after total joint surgery are not necessary.  Exceptions are as follows:  1. History of prior total joint infection  2. Severely immunocompromised (Organ Transplant, cancer chemotherapy, Rheumatoid biologic meds such as [name])  3. Poorly controlled diabetes (A1C &gt; 8.0, blood glucose over 200)  If you have one of these conditions, contact your surgeon for an antibiotic prescription, prior to your dental procedure.  Discharge Instructions     Call MD / Call 911   Complete by: As directed    If you experience chest pain or shortness of breath, CALL 911 and be transported to the hospital emergency room.  If you develope a fever above 101 F, pus (white drainage) or increased drainage or redness at the wound, or calf pain, call your surgeon's office.   Constipation Prevention   Complete by: As directed    Drink plenty of fluids.  Prune juice may be helpful.  You may use a stool softener, such as Colace (over the counter) 100 mg twice a day.  Use MiraLax (over the counter) for constipation as needed.   Diet - low sodium heart healthy   Complete by: As directed    Increase activity slowly as tolerated   Complete by: As directed    Post-operative opioid taper instructions:   Complete by: As directed    POST-OPERATIVE OPIOID TAPER INSTRUCTIONS: It is important to wean off of your opioid medication as soon as possible. If you do not need pain medication after your surgery it is ok to stop day one. Opioids include: Codeine, Hydrocodone(Norco,  Vicodin), Oxycodone(Percocet, oxycontin) and hydromorphone amongst others.  Long term and even short term use of opiods can cause: Increased pain response Dependence Constipation Depression Respiratory depression And more.  Withdrawal symptoms can include Flu like symptoms Nausea, vomiting And more Techniques to manage these symptoms Hydrate well Eat regular healthy meals Stay active Use relaxation techniques(deep breathing,  meditating, yoga) Do Not substitute Alcohol to help with tapering If you have been on opioids for less than two weeks and do not have pain than it is ok to stop all together.  Plan to wean off of opioids This plan should start within one week post op of your joint replacement. Maintain the same interval or time between taking each dose and first decrease the dose.  Cut the total daily intake of opioids by one tablet each day Next start to increase the time between doses. The last dose that should be eliminated is the evening dose.          Allergies as of 04/21/2022       Reactions   Aspirin Other (See Comments)   Pt states her PCP told her not to take aspirin but she can not remember exactly why.   Codeine Nausea And Vomiting   Hydrocodone Nausea And Vomiting   Latex Itching   Morphine And Related Nausea And Vomiting   Darvon [propoxyphene] Nausea And Vomiting        Medication List     TAKE these medications    acarbose 25 MG tablet Commonly known as: PRECOSE TAKE 1 TABLET BY MOUTH 3 TIMES A DAY WITH MEALS   Accu-Chek FastClix Lancets Misc 1 each by Does not apply route daily. E11.9   Accu-Chek Guide test strip Generic drug: glucose blood USE TO TEST BLOOD GLUCOSE 2 TIMES DAILY, AS INSTRUCTED. DX CODE: 250.00   acetaminophen 325 MG tablet Commonly known as: TYLENOL Take 650 mg by mouth every 6 (six) hours as needed for pain.   alprazolam 2 MG tablet Commonly known as: XANAX Take 2 mg by mouth 2 (two) times daily.   atenolol 25 MG tablet Commonly known as: TENORMIN Take 1 tablet (25 mg total) by mouth daily.   CALCIUM CITRATE + D PO Take 1 tablet by mouth daily.   Crestor 5 MG tablet Generic drug: rosuvastatin Take 5 mg by mouth daily.   diclofenac sodium 1 % Gel Commonly known as: VOLTAREN Apply 4 g topically 4 (four) times daily as needed (LEG PAIN).   famotidine 20 MG tablet Commonly known as: PEPCID Take 20 mg by mouth daily.   gabapentin  300 MG capsule Commonly known as: NEURONTIN Take 300 mg by mouth 3 (three) times daily.   hydrochlorothiazide 12.5 MG capsule Commonly known as: MICROZIDE Take 1 capsule (12.5 mg total) by mouth daily.   levothyroxine 50 MCG tablet Commonly known as: SYNTHROID Take 1 tablet (50 mcg total) by mouth daily.   metFORMIN 500 MG 24 hr tablet Commonly known as: GLUCOPHAGE-XR Take 4 tablets (2,000 mg total) by mouth daily with breakfast. What changed:  how much to take when to take this   methocarbamol 500 MG tablet Commonly known as: ROBAXIN Take 1 tablet (500 mg total) by mouth every 8 (eight) hours as needed for muscle spasms. What changed: when to take this   mirtazapine 15 MG tablet Commonly known as: REMERON Take 15 mg by mouth at bedtime.   olmesartan 5 MG tablet Commonly known as: BENICAR Take 5 mg by mouth daily.  oxyCODONE-acetaminophen 5-325 MG tablet Commonly known as: PERCOCET/ROXICET Take 1-2 tablets by mouth every 4 (four) hours as needed for severe pain or moderate pain. What changed:  how much to take reasons to take this   promethazine 25 MG tablet Commonly known as: PHENERGAN Take 25 mg by mouth every 6 (six) hours as needed for nausea or vomiting.   repaglinide 2 MG tablet Commonly known as: PRANDIN Take 1 tablet (2 mg total) by mouth 3 (three) times daily before meals.   Rybelsus 14 MG Tabs Generic drug: Semaglutide Take 1 tablet (14 mg total) by mouth daily.   Vitamin D (Ergocalciferol) 1.25 MG (50000 UNIT) Caps capsule Commonly known as: DRISDOL Take 50,000 Units by mouth every 7 (seven) days.          Signed: Ventura Bruns 04/21/2022, 9:25 AM  [facility] is now [facility] [address] [place], [place], [place] [address] Phone: [place]

## 2022-04-21 NOTE — Progress Notes (Signed)
   Subjective: 3 Days Post-Op Procedure(s) (LRB): REVERSE SHOULDER ARTHROPLASTY (Left)  Pt still feeling sore  Plan to d/c later today after therapy Denies any new symptoms or issues Patient reports pain as moderate.  Objective:   VITALS:   Vitals:   04/20/22 2144 04/21/22 0605  BP: (!) 115/47 122/61  Pulse: 99 85  Resp: 17 17  Temp: (!) 100.4 F (38 C) 97.7 F (36.5 C)  SpO2: 93% 94%    Left shoulder incision healing well Nv intact distally No rashes or edema distally Guarded rom  LABS Recent Labs    04/19/22 0326  HGB 10.3*  HCT 33.0*    Recent Labs    04/19/22 0326  NA 142  K 4.0  BUN 10  CREATININE 0.62  GLUCOSE 224*     Assessment/Plan: 3 Days Post-Op Procedure(s) (LRB): REVERSE SHOULDER ARTHROPLASTY (Left) Plan to d/c today after therapy F/u in 2 weeks Sling for comfort Pain management    Kathrynn Speed, [place] is now [facility]  [place] [address], [place], [place], [place] [address] Phone: [phone] [email or web address] Facebook  [employer]

## 2022-04-21 NOTE — Care Management Important Message (Signed)
Important Message  Patient Details IM Letter given to the Patient. Name: [name] MRN: [id number] Date of Birth: [birth year]   Medicare Important Message Given:  Yes     Suleyma, Wafer 04/21/2022, 2:48 PM

## 2022-04-21 NOTE — Progress Notes (Signed)
Orthopedics Progress Note  Subjective: Patient not feeling well and therapy feels like she still needs 24/7 supervision.  Objective:  Vitals:   04/21/22 0605 04/21/22 1313  BP: 122/61 127/74  Pulse: 85 96  Resp: 17 18  Temp: 97.7 F (36.5 C) 97.9 F (36.6 C)  SpO2: 94% 98%    General: Awake and alert  Musculoskeletal: Left shoulder dressing CDI, minimal arm swelling on the left. No pain with gentle AAROM Neurovascularly intact  Lab Results  Component Value Date   WBC 6.9 04/07/2022   HGB 10.3 (L) 04/19/2022   HCT 33.0 (L) 04/19/2022   MCV 90.3 04/07/2022   PLT 254 04/07/2022       Component Value Date/Time   NA 142 04/19/2022 0326   K 4.0 04/19/2022 0326   CL 109 04/19/2022 0326   CO2 27 04/19/2022 0326   GLUCOSE 224 (H) 04/19/2022 0326   BUN 10 04/19/2022 0326   CREATININE 0.62 04/19/2022 0326   CREATININE 0.64 10/19/2014 1247   CALCIUM 8.8 (L) 04/19/2022 0326   GFRNONAA >60 04/19/2022 0326   GFRAA  02/14/2008 0525    >60        The eGFR has been calculated using the MDRD equation. This calculation has not been validated in all clinical    Lab Results  Component Value Date   INR 2.0 (H) 02/15/2008   INR 1.7 (H) 02/14/2008   INR 1.2 02/13/2008    Assessment/Plan: POD #3 s/p Procedure(s): REVERSE SHOULDER ARTHROPLASTY Will check labs and urine as patient seems lethargic Minimize narcotics Hopeful discharge to home tomorrow  Doran Heater. Veverly Fells, MD 04/21/2022 4:29 PM

## 2022-04-21 NOTE — Progress Notes (Signed)
Occupational Therapy Treatment Patient Details Name: [name] MRN: [id number] DOB: 1941-06-01 Today's Date: 04/21/2022   History of present illness Patient s/p left reverse TSA.   OT comments  Patient was noted to be more steady on this date with cane v.s. no assistive device. Patient was able to complete toileting tasks with distant sup with the cane with occasional safety cues. Patient was educated on importance of shoulder precautions. Patient verbalized understanding but continued to need cues to not move shoulder with ADL tasks. Patient was able to find precautions sheets in purse and nurse was educated on their location as well. Patient will need 24/7 S to be most successful with shoulder restrictions and ADLs at time of d/c./ Patient would continue to benefit from skilled OT services at this time while admitted and after d/c to address noted deficits in order to improve overall safety and independence in ADLs.     Recommendations for follow up therapy are one component of a multi-disciplinary discharge planning process, led by the attending physician.  Recommendations may be updated based on patient status, additional functional criteria and insurance authorization.    Follow Up Recommendations  Follow physician's recommendations for discharge plan and follow up therapies    Assistance Recommended at Discharge Intermittent Supervision/Assistance  Patient can return home with the following  A little help with bathing/dressing/bathroom;Assistance with cooking/housework   Equipment Recommendations  None recommended by OT    Recommendations for Other Services      Precautions / Restrictions Precautions Precautions: Shoulder Type of Shoulder Precautions: No AROM, No PROM, elbow,wrist and hand okay for AROM Shoulder Interventions: Shoulder sling/immobilizer;Off for dressing/bathing/exercises Precaution Booklet Issued: Yes (comment) Required Braces or Orthoses:  Sling Restrictions Weight Bearing Restrictions: Yes LUE Weight Bearing: Non weight bearing       Mobility Bed Mobility                    Transfers                         Balance                                           ADL either performed or assessed with clinical judgement   ADL Overall ADL's : Needs assistance/impaired     Grooming: Modified independent;Standing   Upper Body Bathing: Minimal assistance Upper Body Bathing Details (indicate cue type and reason): patient needed cues to not raise LUE with hygiene or dressing tasks.     Upper Body Dressing : Minimal assistance Upper Body Dressing Details (indicate cue type and reason): with cues on keeping LUE still at shoulder.     Toilet Transfer: Diplomatic Services operational officer Details (indicate cue type and reason): with cane                Extremity/Trunk Assessment              Vision       Perception     Praxis      Cognition Arousal/Alertness: Awake/alert Behavior During Therapy: WFL for tasks assessed/performed Overall Cognitive Status: Within Functional Limits for tasks assessed                                 General Comments: patient was noted to  repeat herself at times during session and was confused as to what day it was with sunday still written on board in room this AM        Exercises      Shoulder Instructions       General Comments      Pertinent Vitals/ Pain       Pain Assessment Pain Assessment: Faces Faces Pain Scale: Hurts even more Pain Location: L shoulder Pain Descriptors / Indicators: Aching, Sore, Grimacing, Restless Pain Intervention(s): Limited activity within patient's tolerance, Monitored during session  Home Living                                          Prior Functioning/Environment              Frequency  Min 2X/week        Progress Toward Goals  OT  Goals(current goals can now be found in the care plan section)  Progress towards OT goals: Progressing toward goals     Plan      Co-evaluation                 AM-PAC OT "6 Clicks" Daily Activity     Outcome Measure   Help from another person eating meals?: A Little Help from another person taking care of personal grooming?: None Help from another person toileting, which includes using toliet, bedpan, or urinal?: None Help from another person bathing (including washing, rinsing, drying)?: A Little Help from another person to put on and taking off regular upper body clothing?: A Little Help from another person to put on and taking off regular lower body clothing?: None 6 Click Score: 21    End of Session Equipment Utilized During Treatment: Other (comment) (standard cane)  OT Visit Diagnosis: Muscle weakness (generalized) (M62.81)   Activity Tolerance Patient tolerated treatment well   Patient Left in chair;with call bell/phone within reach   Nurse Communication Mobility status        Time: 2979-8921 OT Time Calculation (min): 34 min  Charges: OT General Charges $OT Visit: 1 Visit OT Treatments $Self Care/Home Management : 23-37 mins  Jackelyn Poling OTR/L, MS Acute Rehabilitation Department Office# [phone] Pager# [phone]   Marcellina Millin 04/21/2022, 10:52 AM

## 2022-04-22 LAB — GLUCOSE, CAPILLARY
Glucose-Capillary: 159 mg/dL — ABNORMAL HIGH (ref 70–99)
Glucose-Capillary: 210 mg/dL — ABNORMAL HIGH (ref 70–99)

## 2022-04-22 NOTE — Progress Notes (Signed)
Orthopedics Progress Note  Subjective: Left shoulder pain under control  Objective:  Vitals:   04/21/22 2030 04/22/22 0555  BP: 138/67 (!) 113/55  Pulse: 97 85  Resp: 16 18  Temp: 98.1 F (36.7 C) 98.2 F (36.8 C)  SpO2: 98% 95%    General: Awake and alert  Musculoskeletal: dressing CDI Neurovascularly intact  Lab Results  Component Value Date   WBC 7.2 04/21/2022   HGB 11.0 (L) 04/21/2022   HCT 34.9 (L) 04/21/2022   MCV 92.6 04/21/2022   PLT 202 04/21/2022       Component Value Date/Time   NA 135 04/21/2022 1646   K 4.4 04/21/2022 1646   CL 102 04/21/2022 1646   CO2 24 04/21/2022 1646   GLUCOSE 153 (H) 04/21/2022 1646   BUN 12 04/21/2022 1646   CREATININE 0.68 04/21/2022 1646   CREATININE 0.64 10/19/2014 1247   CALCIUM 8.8 (L) 04/21/2022 1646   GFRNONAA >60 04/21/2022 1646   GFRAA  02/14/2008 0525    >60        The eGFR has been calculated using the MDRD equation. This calculation has not been validated in all clinical    Lab Results  Component Value Date   INR 2.0 (H) 02/15/2008   INR 1.7 (H) 02/14/2008   INR 1.2 02/13/2008    Assessment/Plan: POD #4 s/p Procedure(s): [place] today after therapy  Remo Lipps R. Veverly Fells, MD 04/22/2022 7:49 AM

## 2022-04-22 NOTE — Progress Notes (Signed)
Occupational Therapy Treatment Patient Details Name: [name] MRN: [id number] DOB: [birth year] Today's Date: 04/22/2022   History of present illness Patient s/p left reverse TSA.   OT comments  Patient was noted to have better safety awareness for all toileting tasks at cane level on this date with door navigation with no LOB. Patient was noted to have slower speed in the hallway reporting that she typically sleeps in later at home. Patient had no LOB with education on using personal cane at home. Patient verbalized understanding. Patient indicated plan was to d/c home today. Dr.Norris was seen this AM with upgrade for patient to use shoulder as needed for ADLs.  Patient continues to report pain with all movement of shoulder. Patient completed her home exercises with MI with education on where the print out was in her purse. Patient verbalized understanding.  Patient plans to transition home with intermittent supervision at time of d/c. OT to continue to follow acutely.    Recommendations for follow up therapy are one component of a multi-disciplinary discharge planning process, led by the attending physician.  Recommendations may be updated based on patient status, additional functional criteria and insurance authorization.    Follow Up Recommendations  Follow physician's recommendations for discharge plan and follow up therapies    Assistance Recommended at Discharge Intermittent Supervision/Assistance  Patient can return home with the following  A little help with bathing/dressing/bathroom;Assistance with cooking/housework   Equipment Recommendations  None recommended by OT    Recommendations for Other Services      Precautions / Restrictions Precautions Precautions: Shoulder Type of Shoulder Precautions: No AROM, No PROM, elbow,wrist and hand okay for AROM Shoulder Interventions: Shoulder sling/immobilizer;Off for dressing/bathing/exercises Precaution Booklet Issued: Yes  (comment) Required Braces or Orthoses: Sling Restrictions Weight Bearing Restrictions: Yes LUE Weight Bearing: Non weight bearing Other Position/Activity Restrictions: per Dr.Norris on 5/23 verbally allowed patient to use LUE on this date.       Mobility Bed Mobility               General bed mobility comments: OOB    Transfers   Equipment used: Straight cane Transfers: Sit to/from Stand, Bed to chair/wheelchair/BSC Sit to Stand: Modified independent (Device/Increase time)           General transfer comment: patient is able to take herself back and forth to bathroom with MI at cane level with no LOB and good safety awareness asking for lights to be turned on and navigation on doors     Balance                                           ADL either performed or assessed with clinical judgement   ADL Overall ADL's : Needs assistance/impaired     Grooming: Modified independent;Standing             Upper Body Dressing Details (indicate cue type and reason): patient declined she was fixated on breakfast arrival     Toilet Transfer: Modified Independent Toilet Transfer Details (indicate cue type and reason): with cane Toileting- Clothing Manipulation and Hygiene: Modified independent Toileting - Clothing Manipulation Details (indicate cue type and reason): with cane     Functional mobility during ADLs: Modified independent General ADL Comments: patient was able to particiapte in functional mobility in the hallway with SUP with cane. patient was slow and purposeful with movements with no  LOB noted. patient was educated on using cane at home. patient verbalized and demonstrated understanding. patients shoulder precautions were reviewed on this date.    Extremity/Trunk Assessment Upper Extremity Assessment LUE Deficits / Details: patient noted to move shoulder during ADL tasks without appering to realizing it.            Vision        Perception     Praxis      Cognition Arousal/Alertness: Awake/alert Behavior During Therapy: WFL for tasks assessed/performed Overall Cognitive Status: Within Functional Limits for tasks assessed                                 General Comments: patient was noted to have difficulty problem solving how to manipulate sling during session.        Exercises Shoulder Exercises Elbow Flexion: AROM, 10 reps, Left Wrist Flexion: AROM, Left, 10 reps (forearm supination) Digit Composite Flexion: AROM, Left, 10 reps    Shoulder Instructions       General Comments      Pertinent Vitals/ Pain       Pain Assessment Pain Assessment: 0-10 Pain Score: 6  Pain Location: L shoulder Pain Descriptors / Indicators: Aching, Sore, Grimacing, Restless Pain Intervention(s): Limited activity within patient's tolerance, Monitored during session, Premedicated before session  Home Living                                          Prior Functioning/Environment              Frequency  Min 2X/week        Progress Toward Goals  OT Goals(current goals can now be found in the care plan section)  Progress towards OT goals: Progressing toward goals     Plan Discharge plan remains appropriate    Co-evaluation                 AM-PAC OT "6 Clicks" Daily Activity     Outcome Measure   Help from another person eating meals?: None Help from another person taking care of personal grooming?: None Help from another person toileting, which includes using toliet, bedpan, or urinal?: None Help from another person bathing (including washing, rinsing, drying)?: A Little Help from another person to put on and taking off regular upper body clothing?: A Little Help from another person to put on and taking off regular lower body clothing?: A Little 6 Click Score: 21    End of Session Equipment Utilized During Treatment: Other (comment) (cane)  OT Visit  Diagnosis: Muscle weakness (generalized) (M62.81)   Activity Tolerance Patient tolerated treatment well   Patient Left in chair;with call bell/phone within reach   Nurse Communication Mobility status        Time: 8921-1941 OT Time Calculation (min): 24 min  Charges: OT General Charges $OT Visit: 1 Visit OT Treatments $Self Care/Home Management : 23-37 mins  Jackelyn Poling OTR/L, MS Acute Rehabilitation Department Office# [phone] Pager# [phone]   Marcellina Millin 04/22/2022, 9:16 AM

## 2022-04-22 NOTE — Discharge Summary (Signed)
In most cases prophylactic antibiotics for Dental procdeures after total joint surgery are not necessary.  Exceptions are as follows:  1. History of prior total joint infection  2. Severely immunocompromised (Organ Transplant, cancer chemotherapy, Rheumatoid biologic meds such as [place])  3. Poorly controlled diabetes (A1C &gt; 8.0, blood glucose over 200)  If you have one of these conditions, contact your surgeon for an antibiotic prescription, prior to your dental procedure. Orthopedic Discharge Summary        Physician Discharge Summary  Patient ID: [name] MRN: [id number] DOB/AGE: 03-Sep-1941 81 y.o.  Admit date: 04/18/2022 Discharge date: 04/22/2022   Procedures:  Procedure(s) (LRB): REVERSE SHOULDER ARTHROPLASTY (Left)  Attending Physician:  Dr. Esmond Plants  Admission Diagnoses:   left shoulder RCT  Discharge Diagnoses:  same   Past Medical History:  Diagnosis Date   Anxiety    Arthritis    Cancer ([place])    Depression    Diabetes mellitus without complication ([place])    Hypertension    Hypothyroidism     PCP: [name], MD   Discharged Condition: good  Hospital Course:  Patient underwent the above stated procedure on 04/18/2022. Patient tolerated the procedure well and brought to the recovery room in good condition and subsequently to the floor. Patient had an uncomplicated hospital course and was stable for discharge.   Disposition: Discharge disposition: 01-Home or Self Care      with follow up in 2 weeks    Follow-up Information     Netta Cedars, MD. Schedule an appointment as soon as possible for a visit in 2 week(s).   Specialty: Orthopedic Surgery Why: Call [phone] to schedule appt in 2 weeks. Contact information: [address] [place] [address] [phone]                 Dental Antibiotics:  In most cases prophylactic antibiotics for Dental procdeures after total joint surgery are not  necessary.  Exceptions are as follows:  1. History of prior total joint infection  2. Severely immunocompromised (Organ Transplant, cancer chemotherapy, Rheumatoid biologic meds such as [place])  3. Poorly controlled diabetes (A1C &gt; 8.0, blood glucose over 200)  If you have one of these conditions, contact your surgeon for an antibiotic prescription, prior to your dental procedure.  Discharge Instructions     Call MD / Call 911   Complete by: As directed    If you experience chest pain or shortness of breath, CALL 911 and be transported to the hospital emergency room.  If you develope a fever above 101 F, pus (white drainage) or increased drainage or redness at the wound, or calf pain, call your surgeon's office.   Call MD / Call 911   Complete by: As directed    If you experience chest pain or shortness of breath, CALL 911 and be transported to the hospital emergency room.  If you develope a fever above 101 F, pus (white drainage) or increased drainage or redness at the wound, or calf pain, call your surgeon's office.   Constipation Prevention   Complete by: As directed    Drink plenty of fluids.  Prune juice may be helpful.  You may use a stool softener, such as Colace (over the counter) 100 mg twice a day.  Use MiraLax (over the counter) for constipation as needed.   Constipation Prevention   Complete by: As directed    Drink plenty of fluids.  Prune juice may be helpful.  You  may use a stool softener, such as Colace (over the counter) 100 mg twice a day.  Use MiraLax (over the counter) for constipation as needed.   Diet - low sodium heart healthy   Complete by: As directed    Diet - low sodium heart healthy   Complete by: As directed    Increase activity slowly as tolerated   Complete by: As directed    Increase activity slowly as tolerated   Complete by: As directed    Post-operative opioid taper instructions:   Complete by: As directed    POST-OPERATIVE OPIOID TAPER  INSTRUCTIONS: It is important to wean off of your opioid medication as soon as possible. If you do not need pain medication after your surgery it is ok to stop day one. Opioids include: Codeine, Hydrocodone(Norco, Vicodin), Oxycodone(Percocet, oxycontin) and hydromorphone amongst others.  Long term and even short term use of opiods can cause: Increased pain response Dependence Constipation Depression Respiratory depression And more.  Withdrawal symptoms can include Flu like symptoms Nausea, vomiting And more Techniques to manage these symptoms Hydrate well Eat regular healthy meals Stay active Use relaxation techniques(deep breathing, meditating, yoga) Do Not substitute Alcohol to help with tapering If you have been on opioids for less than two weeks and do not have pain than it is ok to stop all together.  Plan to wean off of opioids This plan should start within one week post op of your joint replacement. Maintain the same interval or time between taking each dose and first decrease the dose.  Cut the total daily intake of opioids by one tablet each day Next start to increase the time between doses. The last dose that should be eliminated is the evening dose.      Post-operative opioid taper instructions:   Complete by: As directed    POST-OPERATIVE OPIOID TAPER INSTRUCTIONS: It is important to wean off of your opioid medication as soon as possible. If you do not need pain medication after your surgery it is ok to stop day one. Opioids include: Codeine, Hydrocodone(Norco, Vicodin), Oxycodone(Percocet, oxycontin) and hydromorphone amongst others.  Long term and even short term use of opiods can cause: Increased pain response Dependence Constipation Depression Respiratory depression And more.  Withdrawal symptoms can include Flu like symptoms Nausea, vomiting And more Techniques to manage these symptoms Hydrate well Eat regular healthy meals Stay active Use  relaxation techniques(deep breathing, meditating, yoga) Do Not substitute Alcohol to help with tapering If you have been on opioids for less than two weeks and do not have pain than it is ok to stop all together.  Plan to wean off of opioids This plan should start within one week post op of your joint replacement. Maintain the same interval or time between taking each dose and first decrease the dose.  Cut the total daily intake of opioids by one tablet each day Next start to increase the time between doses. The last dose that should be eliminated is the evening dose.          Allergies as of 04/22/2022       Reactions   Aspirin Other (See Comments)   Pt states her PCP told her not to take aspirin but she can not remember exactly why.   Codeine Nausea And Vomiting   Hydrocodone Nausea And Vomiting   Latex Itching   Morphine And Related Nausea And Vomiting   Darvon [propoxyphene] Nausea And Vomiting        Medication List  TAKE these medications    acarbose 25 MG tablet Commonly known as: PRECOSE TAKE 1 TABLET BY MOUTH 3 TIMES A DAY WITH MEALS   Accu-Chek FastClix Lancets Misc 1 each by Does not apply route daily. E11.9   Accu-Chek Guide test strip Generic drug: glucose blood USE TO TEST BLOOD GLUCOSE 2 TIMES DAILY, AS INSTRUCTED. DX CODE: 250.00   acetaminophen 325 MG tablet Commonly known as: TYLENOL Take 650 mg by mouth every 6 (six) hours as needed for pain.   alprazolam 2 MG tablet Commonly known as: XANAX Take 2 mg by mouth 2 (two) times daily.   atenolol 25 MG tablet Commonly known as: TENORMIN Take 1 tablet (25 mg total) by mouth daily.   CALCIUM CITRATE + D PO Take 1 tablet by mouth daily.   Crestor 5 MG tablet Generic drug: rosuvastatin Take 5 mg by mouth daily.   diclofenac sodium 1 % Gel Commonly known as: VOLTAREN Apply 4 g topically 4 (four) times daily as needed (LEG PAIN).   famotidine 20 MG tablet Commonly known as: PEPCID Take  20 mg by mouth daily.   gabapentin 300 MG capsule Commonly known as: NEURONTIN Take 300 mg by mouth 3 (three) times daily.   hydrochlorothiazide 12.5 MG capsule Commonly known as: MICROZIDE Take 1 capsule (12.5 mg total) by mouth daily.   levothyroxine 50 MCG tablet Commonly known as: SYNTHROID Take 1 tablet (50 mcg total) by mouth daily.   metFORMIN 500 MG 24 hr tablet Commonly known as: GLUCOPHAGE-XR Take 4 tablets (2,000 mg total) by mouth daily with breakfast. What changed:  how much to take when to take this   methocarbamol 500 MG tablet Commonly known as: ROBAXIN Take 1 tablet (500 mg total) by mouth every 8 (eight) hours as needed for muscle spasms. What changed: when to take this   mirtazapine 15 MG tablet Commonly known as: REMERON Take 15 mg by mouth at bedtime.   olmesartan 5 MG tablet Commonly known as: BENICAR Take 5 mg by mouth daily.   oxyCODONE-acetaminophen 5-325 MG tablet Commonly known as: PERCOCET/ROXICET Take 1-2 tablets by mouth every 4 (four) hours as needed for severe pain or moderate pain. What changed:  how much to take reasons to take this   promethazine 25 MG tablet Commonly known as: PHENERGAN Take 25 mg by mouth every 6 (six) hours as needed for nausea or vomiting.   repaglinide 2 MG tablet Commonly known as: PRANDIN Take 1 tablet (2 mg total) by mouth 3 (three) times daily before meals.   Rybelsus 14 MG Tabs Generic drug: Semaglutide Take 1 tablet (14 mg total) by mouth daily.   Vitamin D (Ergocalciferol) 1.25 MG (50000 UNIT) Caps capsule Commonly known as: DRISDOL Take 50,000 Units by mouth every 7 (seven) days.          Signed: Augustin Schooling 04/22/2022, 7:50 AM  [facility] is now [facility] [address] [place], [place], [place] [address] Phone: [place]

## 2022-04-22 NOTE — Plan of Care (Signed)
DC orders received, all DC instructions given and questions answered. Prune/ apple juice, + PR dulcolax administered with great results. PIV removed previously. Pt awaiting ride home.   Problem: Education: Goal: Knowledge of General Education information will improve Description: Including pain rating scale, medication(s)/side effects and non-pharmacologic comfort measures Outcome: Adequate for Discharge   Problem: Health Behavior/Discharge Planning: Goal: Ability to manage health-related needs will improve Outcome: Adequate for Discharge   Problem: Clinical Measurements: Goal: Ability to maintain clinical measurements within normal limits will improve Outcome: Adequate for Discharge Goal: Will remain free from infection Outcome: Adequate for Discharge Goal: Diagnostic test results will improve Outcome: Adequate for Discharge Goal: Respiratory complications will improve Outcome: Adequate for Discharge Goal: Cardiovascular complication will be avoided Outcome: Adequate for Discharge   Problem: Activity: Goal: Risk for activity intolerance will decrease Outcome: Adequate for Discharge   Problem: Nutrition: Goal: Adequate nutrition will be maintained Outcome: Adequate for Discharge   Problem: Coping: Goal: Level of anxiety will decrease Outcome: Adequate for Discharge   Problem: Elimination: Goal: Will not experience complications related to bowel motility Outcome: Adequate for Discharge Goal: Will not experience complications related to urinary retention Outcome: Adequate for Discharge   Problem: Pain Managment: Goal: General experience of comfort will improve Outcome: Adequate for Discharge   Problem: Safety: Goal: Ability to remain free from injury will improve Outcome: Adequate for Discharge   Problem: Skin Integrity: Goal: Risk for impaired skin integrity will decrease Outcome: Adequate for Discharge   Problem: Education: Goal: Knowledge of the prescribed  therapeutic regimen will improve Outcome: Adequate for Discharge Goal: Understanding of activity limitations/precautions following surgery will improve Outcome: Adequate for Discharge Goal: Individualized Educational Video(s) Outcome: Adequate for Discharge   Problem: Activity: Goal: Ability to tolerate increased activity will improve Outcome: Adequate for Discharge   Problem: Pain Management: Goal: Pain level will decrease with appropriate interventions Outcome: Adequate for Discharge

## 2022-04-23 ENCOUNTER — Encounter (HOSPITAL_COMMUNITY): Payer: Self-pay | Admitting: Orthopedic Surgery

## 2022-04-30 DIAGNOSIS — Z4789 Encounter for other orthopedic aftercare: Secondary | ICD-10-CM | POA: Diagnosis not present

## 2022-05-29 DIAGNOSIS — Z4789 Encounter for other orthopedic aftercare: Secondary | ICD-10-CM | POA: Diagnosis not present

## 2022-06-10 DIAGNOSIS — M25512 Pain in left shoulder: Secondary | ICD-10-CM | POA: Diagnosis not present

## 2022-06-10 DIAGNOSIS — M25612 Stiffness of left shoulder, not elsewhere classified: Secondary | ICD-10-CM | POA: Diagnosis not present

## 2022-06-17 DIAGNOSIS — M25512 Pain in left shoulder: Secondary | ICD-10-CM | POA: Diagnosis not present

## 2022-06-17 DIAGNOSIS — M25612 Stiffness of left shoulder, not elsewhere classified: Secondary | ICD-10-CM | POA: Diagnosis not present

## 2022-06-24 DIAGNOSIS — M25612 Stiffness of left shoulder, not elsewhere classified: Secondary | ICD-10-CM | POA: Diagnosis not present

## 2022-06-24 DIAGNOSIS — M25512 Pain in left shoulder: Secondary | ICD-10-CM | POA: Diagnosis not present

## 2022-07-08 DIAGNOSIS — M25512 Pain in left shoulder: Secondary | ICD-10-CM | POA: Diagnosis not present

## 2022-07-08 DIAGNOSIS — M25612 Stiffness of left shoulder, not elsewhere classified: Secondary | ICD-10-CM | POA: Diagnosis not present

## 2022-07-15 DIAGNOSIS — Z4789 Encounter for other orthopedic aftercare: Secondary | ICD-10-CM | POA: Diagnosis not present

## 2022-07-25 IMAGING — US US FNA BIOPSY THYROID 1ST LESION
1 series · 13 of 24 positions shown · non-contrast
Comparison: US Thyroid 09/06/21

MEDICATIONS:
10 cc 1% lidocaine

COMPLICATIONS:
None immediate.

INDICATION: Indeterminate thyroid nodules.

EXAM:
ULTRASOUND GUIDED FINE NEEDLE ASPIRATION OF INDETERMINATE THYROID
NODULE X 2
TECHNIQUE: Informed written consent was obtained from the patient after a
discussion of the risks, benefits and alternatives to treatment.
Questions regarding the procedure were encouraged and answered. A
timeout was performed prior to the initiation of the procedure.

[Series 1: us fna biopsy thyroid 1st lesion · 0.06mm/px · 24 acquisitions, 13 frames shown]
[im 1/24]
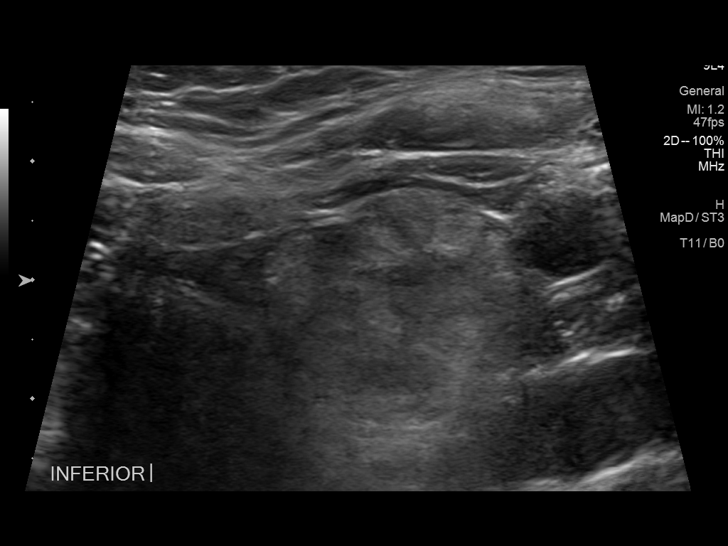
[im 3/24]
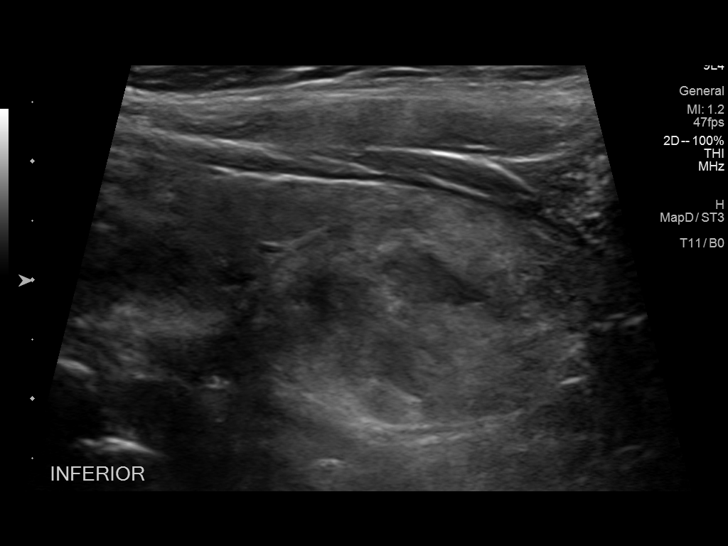
[im 5/24]
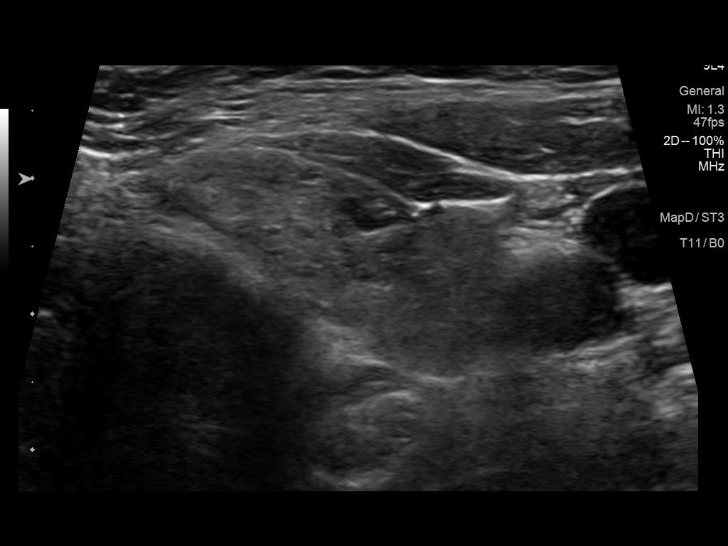
[im 7/24]
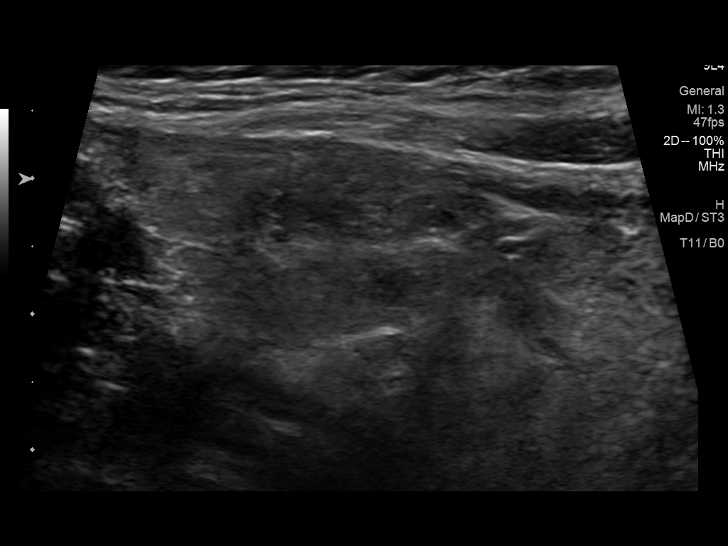
[im 9/24]
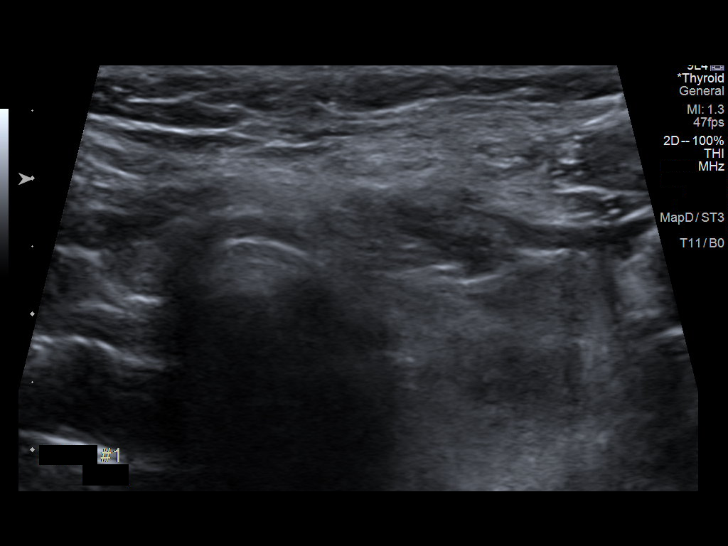
[im 11/24]
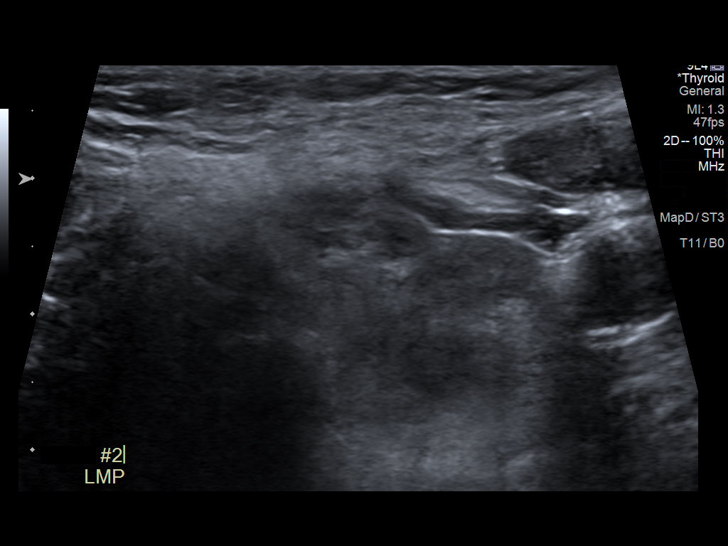
[im 13/24]
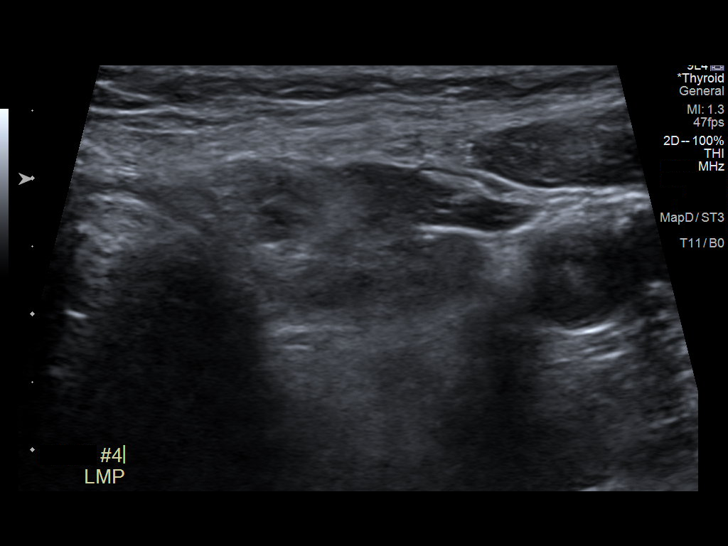
[im 14/24]
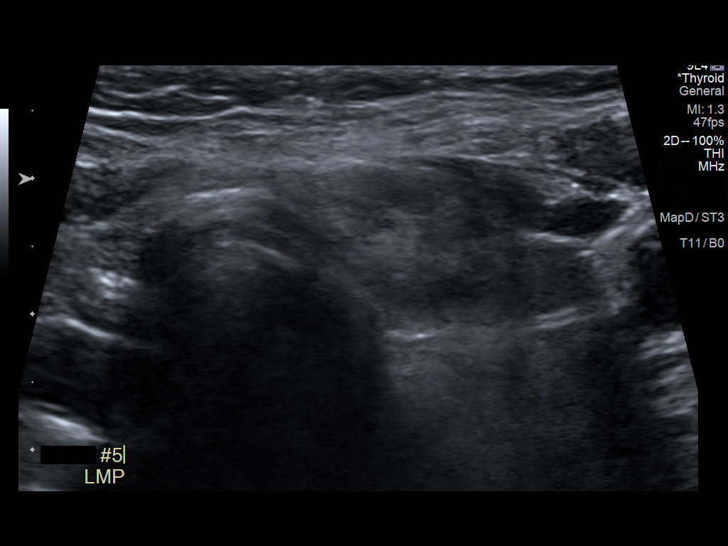
[im 16/24]
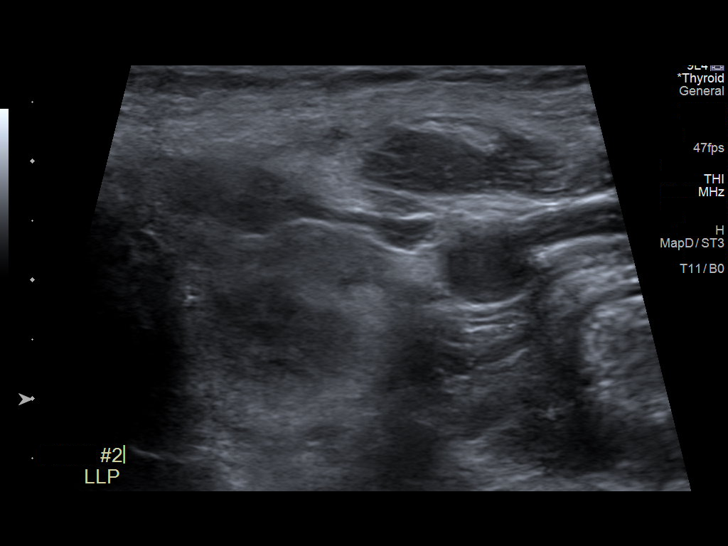
[im 18/24]
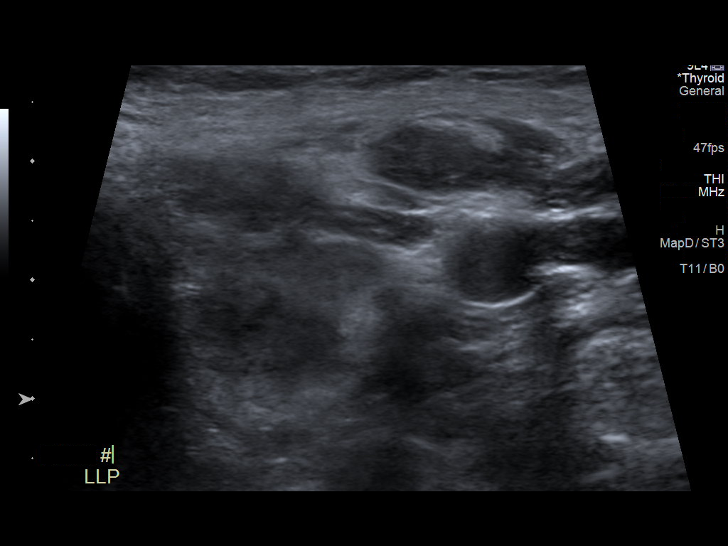
[im 20/24]
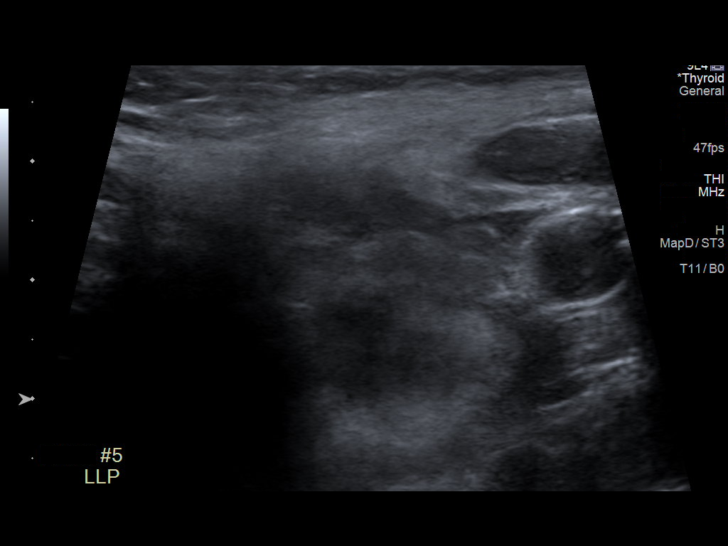
[im 22/24]
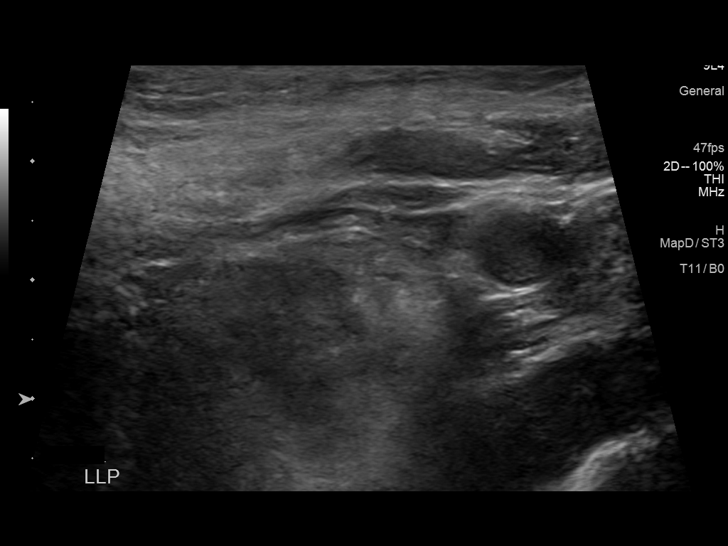
[im 24/24]
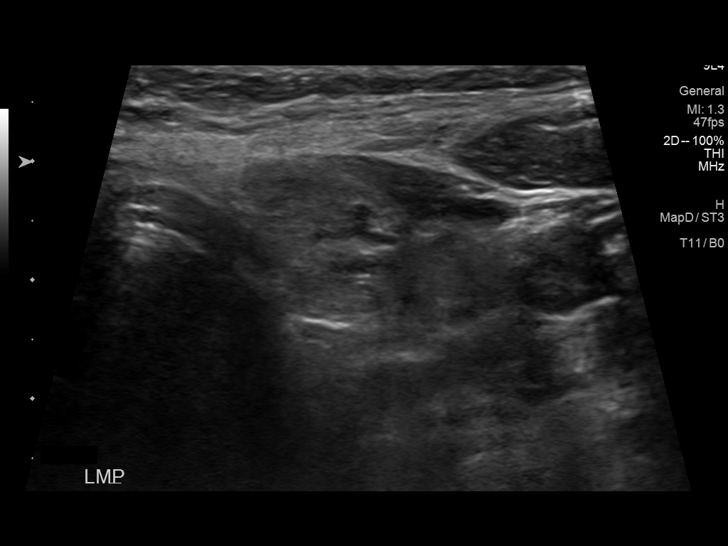

[13 of 24 positions shown; findings below may reference images not displayed]

Pre-procedural ultrasound scanning demonstrated unchanged size and
appearance of the indeterminate nodules within the left thyroid.

The procedure was planned. The neck was prepped in the usual sterile
fashion, and a sterile drape was applied covering the operative
field. A timeout was performed prior to the initiation of the
procedure. Local anesthesia was provided with 1% lidocaine.

Under direct ultrasound guidance, 5 FNA biopsies were performed of
the left mid lobe thyroid nodule with 27 gauge needles.

2 of these samples were obtained for AFIRMA

Multiple ultrasound images were saved for procedural documentation
purposes. The samples were prepared and submitted to pathology.

Under direct ultrasound guidance, 5 FNA biopsies were performed of
the left inferior thyroid nodule with 27 gauge needles.

2 of these samples were obtained for AFIRMA

Multiple ultrasound images were saved for procedural documentation
purposes. The samples were prepared and submitted to pathology.

Limited post procedural scanning was negative for hematoma or
additional complication. Dressings were placed. The patient
tolerated the above procedures procedure well without immediate
postprocedural complication.
FINDINGS: Nodule reference number based on prior diagnostic ultrasound: 2

Maximum size: 2.1 cm

Location: Left; Mid

ACR TI-RADS risk category: TR3 (3 points)

Reason for biopsy: meets ACR TI-RADS criteria

_________________________________________________________

Nodule reference number based on prior diagnostic ultrasound: 3

Maximum size: 2.5 cm

Location: Left; Inferior

ACR TI-RADS risk category: TR4 (4-6 points)

Reason for biopsy: meets ACR TI-RADS criteria

Ultrasound imaging confirms appropriate placement of the needles
within the thyroid nodule.
IMPRESSION: 1. Technically successful ultrasound guided fine needle aspiration
of left mid lobe thyroid nodule
2. Technically successful ultrasound guided fine needle aspiration
of left inferior thyroid nodule

Read by

Raouaa Ajari

## 2022-07-29 DIAGNOSIS — M25512 Pain in left shoulder: Secondary | ICD-10-CM | POA: Diagnosis not present

## 2022-07-29 DIAGNOSIS — M25612 Stiffness of left shoulder, not elsewhere classified: Secondary | ICD-10-CM | POA: Diagnosis not present

## 2022-08-05 DIAGNOSIS — M25512 Pain in left shoulder: Secondary | ICD-10-CM | POA: Diagnosis not present

## 2022-08-05 DIAGNOSIS — M25612 Stiffness of left shoulder, not elsewhere classified: Secondary | ICD-10-CM | POA: Diagnosis not present

## 2022-08-12 ENCOUNTER — Ambulatory Visit (INDEPENDENT_AMBULATORY_CARE_PROVIDER_SITE_OTHER): Payer: Medicare Other | Admitting: Internal Medicine

## 2022-08-12 ENCOUNTER — Encounter: Payer: Self-pay | Admitting: Internal Medicine

## 2022-08-12 ENCOUNTER — Telehealth: Payer: Self-pay | Admitting: Internal Medicine

## 2022-08-12 VITALS — BP 132/88 | HR 87 | Ht 61.0 in | Wt 162.4 lb

## 2022-08-12 DIAGNOSIS — E1142 Type 2 diabetes mellitus with diabetic polyneuropathy: Secondary | ICD-10-CM

## 2022-08-12 DIAGNOSIS — E1165 Type 2 diabetes mellitus with hyperglycemia: Secondary | ICD-10-CM | POA: Diagnosis not present

## 2022-08-12 DIAGNOSIS — E039 Hypothyroidism, unspecified: Secondary | ICD-10-CM | POA: Diagnosis not present

## 2022-08-12 DIAGNOSIS — E042 Nontoxic multinodular goiter: Secondary | ICD-10-CM

## 2022-08-12 LAB — POCT GLYCOSYLATED HEMOGLOBIN (HGB A1C): Hemoglobin A1C: 6.2 % — AB (ref 4.0–5.6)

## 2022-08-12 MED ORDER — METFORMIN HCL ER 500 MG PO TB24: 500.0000 mg | ORAL_TABLET | Freq: Two times a day (BID) | ORAL | 3 refills | Status: DC

## 2022-08-12 NOTE — Telephone Encounter (Signed)
Pt contacted and advised meds were removed from med list. Pt was confused regarding new medication regimen and asked questions to confirm when she should take thyroid medication. Pt was advised how to space out her morning medication and thyroid medication. Pt was advised to move Calcium and Pepcid to evening time. Pt confirmed understanding and will call back with any further questions.

## 2022-08-12 NOTE — Patient Instructions (Addendum)
Please continue: - Metformin ER 500 mg daily - Acarbose 25 mg 3x a day - Rybelsus 14 mg in am  Stop Repaglinide.  Please continue levothyroxine 50 mcg daily  Take the thyroid hormone every day, with water, at least 30 minutes before breakfast, separated by at least 4 hours from: - acid reflux medications - calcium - iron - multivitamins  Move calcium and acid reflux medicines in the afternoon.  Please return in 4 months with your sugar log.   PATIENT INSTRUCTIONS FOR TYPE 2 DIABETES:  **Please join MyChart!** - see attached instructions about how to join if you have not done so already.  DIET AND EXERCISE Diet and exercise is an important part of diabetic treatment.  We recommended aerobic exercise in the form of brisk walking (working between 40-60% of maximal aerobic capacity, similar to brisk walking) for 150 minutes per week (such as 30 minutes five days per week) along with 3 times per week performing 'resistance' training (using various gauge rubber tubes with handles) 5-10 exercises involving the major muscle groups (upper body, lower body and core) performing 10-15 repetitions (or near fatigue) each exercise. Start at half the above goal but build slowly to reach the above goals. If limited by weight, joint pain, or disability, we recommend daily walking in a swimming pool with water up to waist to reduce pressure from joints while allow for adequate exercise.    BLOOD GLUCOSES Monitoring your blood glucoses is important for continued management of your diabetes. Please check your blood glucoses 2-4 times a day: fasting, before meals and at bedtime (you can rotate these measurements - e.g. one day check before the 3 meals, the next day check before 2 of the meals and before bedtime, etc.).   HYPOGLYCEMIA (low blood sugar) Hypoglycemia is usually a reaction to not eating, exercising, or taking too much insulin/ other diabetes drugs.  Symptoms include tremors, sweating, hunger,  confusion, headache, etc. Treat IMMEDIATELY with 15 grams of Carbs: 4 glucose tablets  cup regular juice/soda 2 tablespoons raisins 4 teaspoons sugar 1 tablespoon honey Recheck blood glucose in 15 mins and repeat above if still symptomatic/blood glucose <100.  RECOMMENDATIONS TO REDUCE YOUR RISK OF DIABETIC COMPLICATIONS: * Take your prescribed MEDICATION(S) * Follow a DIABETIC diet: Complex carbs, fiber rich foods, (monounsaturated and polyunsaturated) fats * AVOID saturated/trans fats, high fat foods, >2,300 mg salt per day. * EXERCISE at least 5 times a week for 30 minutes or preferably daily.  * DO NOT SMOKE OR DRINK more than 1 drink a day. * Check your FEET every day. Do not wear tightfitting shoes. Contact us if you develop an ulcer * See your EYE doctor once a year or more if needed * Get a FLU shot once a year * Get a PNEUMONIA vaccine once before and once after age 88 years  GOALS:  * Your Hemoglobin A1c of <7%  * fasting sugars need to be <130 * after meals sugars need to be <180 (2h after you start eating) * Your Systolic BP should be 161 or lower  * Your Diastolic BP should be 80 or lower  * Your HDL (Good Cholesterol) should be 40 or higher  * Your LDL (Bad Cholesterol) should be 100 or lower. * Your Triglycerides should be 150 or lower  * Your Urine microalbumin (kidney function) should be <30 * Your Body Mass Index should be 25 or lower    Please consider the following ways to cut down carbs and  fat and increase fiber and micronutrients in your diet: - substitute whole grain for white bread or pasta - substitute brown rice for white rice - substitute 90-calorie flat bread pieces for slices of bread when possible - substitute sweet potatoes or yams for white potatoes - substitute humus for margarine - substitute tofu for cheese when possible - substitute almond or rice milk for regular milk (would not drink soy milk daily due to concern for soy estrogen  influence on breast cancer risk) - substitute dark chocolate for other sweets when possible - substitute water - can add lemon or orange slices for taste - for diet sodas (artificial sweeteners will trick your body that you can eat sweets without getting calories and will lead you to overeating and weight gain in the long run) - do not skip breakfast or other meals (this will slow down the metabolism and will result in more weight gain over time)  - can try smoothies made from fruit and almond/rice milk in am instead of regular breakfast - can also try old-fashioned (not instant) oatmeal made with almond/rice milk in am - order the dressing on the side when eating salad at a restaurant (pour less than half of the dressing on the salad) - eat as little meat as possible - can try juicing, but should not forget that juicing will get rid of the fiber, so would alternate with eating raw veg./fruits or drinking smoothies - use as little oil as possible, even when using olive oil - can dress a salad with a mix of balsamic vinegar and lemon juice, for e.g. - use agave nectar, stevia sugar, or regular sugar rather than artificial sweateners - steam or broil/roast veggies  - snack on veggies/fruit/nuts (unsalted, preferably) when possible, rather than processed foods - reduce or eliminate aspartame in diet (it is in diet sodas, chewing gum, etc) Read the labels!  Try to read Dr. Janene Harvey book: "Program for Reversing Diabetes" for other ideas for healthy eating.    Marland Kitchen

## 2022-08-12 NOTE — Progress Notes (Signed)
Patient ID: [name], female   DOB: [birth year], 81 y.o.   MRN: [id number]  HPI: [name] is an 81 y.o.-year-old female, returning for follow-up for DM2, dx in 1993, non-insulin-dependent, uncontrolled, with complications (PN). Pt. previously saw Dr. Loanne [name], last visit 4 months ago.  She had L shoulder surgery 4 mo ago >> her L arm is in a sling.  She is in physical therapy.  She has not been checking sugars consistently.    She describes episodes of weakness and dizziness along with swelling in the back of her head occasionally, with exertion. She did not check blood sugars during these episodes.  Reviewed HbA1c: Lab Results  Component Value Date   HGBA1C 7.0 (A) 04/07/2022   HGBA1C 7.9 (A) 02/18/2022   HGBA1C 9.6 (A) 12/17/2021   HGBA1C 8.3 (A) 10/11/2021   HGBA1C 7.0 (A) 04/25/2021   HGBA1C 7.0 (A) 01/24/2021   HGBA1C 7.7 (A) 02/06/2020   HGBA1C 7.5 (A) 11/04/2019   HGBA1C 6.3 (A) 12/07/2018   HGBA1C 10.2 (A) 09/15/2018   Pt is on a regimen of: - Metformin ER 500 mg 2x daily - Prandin 2 mg 3x a day - Acarbose 25 mg 3x a day - Rybelsus 14 mg in am She tried bromocriptine >> glycemia. She tried Invokana >> dizziness. She tried [place] >> hypotension. She tried pioglitazone >> edema.  Pt checks her sugars seldom (L arm in sling) a day - no recent sugars. - am: n/c - 2h after b'fast: n/c - before lunch: n/c - 2h after lunch: n/c - before dinner: n/c - 2h after dinner: n/c - bedtime: n/c - nighttime: n/c Lowest sugar was ? (Episodes of weak and sweating, no tremors); she has hypoglycemia awareness at 70.  Highest sugar was 160.  Glucometer: Accu Check guide  Pt's meals are: - Breakfast: egg and toast; sometimes oatmeal, sometimes grits - Lunch: pimento or bologna cheese sandwich - Dinner: TV dinner - Snacks: sugar free jello  - no CKD, last BUN/creatinine:  Lab Results  Component Value Date   BUN 12 04/21/2022   BUN 10 04/19/2022   CREATININE 0.68  04/21/2022   CREATININE 0.62 04/19/2022  She is is on Benicar 5 mg daily.  - + HL; last set of lipids: No results found for: "CHOL", "HDL", "LDLCALC", "LDLDIRECT", "TRIG", "CHOLHDL" She is on Crestor 5 mg daily.  - last eye exam was >2 years ago. No DR.   - no numbness and tingling in her feet.  On gabapentin 300 mg 3 times a day.  Last foot exam 03/2021.  Hypothyroidism:  Pt is on levothyroxine 50 mcg daily, taken: - in am - fasting - immediately from b'fast -+ calcium either in am or in the pm - no iron - no multivitamins - no PPIs, + Pepcid in am - not on Biotin  Reviewed TSH levels: Lab Results  Component Value Date   TSH 4.34 12/17/2021   TSH 2.21 04/25/2021   Thyroid nodules:  Reviewed the report of her Thyroid U/S (09/06/2021): Parenchymal Echotexture: Mildly heterogeneous  Isthmus: 0.3 cm  Right lobe: 3.0 x 1.3 x 1.5 cm  Left lobe: 5.3 x 2.3 x 2.3 cm  _________________________________________________________   Nodule # 1:  Location: Right; inferior  Maximum size: 1.2 cm; Other 2 dimensions: 0.8 x 0.9 cm  Composition: solid/almost completely solid (2)  Echogenicity: isoechoic (1)  Given size (<1.4 cm) and appearance, this nodule does NOT meet TI-RADS criteria for biopsy or dedicated follow-up.  _________________________________________________________  Nodule # 2:  Location: Left; mid  Maximum size: 2.1 cm; Other 2 dimensions: 1.1 x 1.5 cm  Composition: solid/almost completely solid (2)  Echogenicity: hypoechoic (2)  **Given size (>/= 1.5 cm) and appearance, fine needle aspiration of this moderately suspicious nodule should be considered based on TI-RADS criteria.  _________________________________________________________   Nodule # 3:  Location: Left; inferior  Maximum size: 2.5 cm; Other 2 dimensions: 2.0 x 2.4 cm  Composition: solid/almost completely solid (2)  Echogenicity: isoechoic (1) **Given size (>/= 2.5 cm) and appearance, fine needle  aspiration of this mildly suspicious nodule should be considered based on TI-RADS criteria.  _________________________________________________________   IMPRESSION: 1. Nodule 2 (TI-RADS 4) located in the mid left thyroid lobe meets criteria for FNA. 2. Nodule 3 (TI-RADS 3) located in the inferior left thyroid lobe meets criteria for FNA.  FNA of the 2 nodules (10/02/2021): Clinical History: Left mid 2.1cm; Other 2 dimensions: 1.1 x 1.5cm, Solid  / almost completely solid, Hypoechoic, TI-RADS total points 4  Specimen Submitted:  A. THYROID, LEFT MID, FINE NEEDLE ASPIRATION:  FINAL MICROSCOPIC DIAGNOSIS:  - Follicular lesion of undetermined significance (Bethesda category III)  SPECIMEN ADEQUACY:  Satisfactory for evaluation   Afirma: benign  Clinical History: Left inferior 2.5cm; Other 2 dimensions:2.0 x 2.4cm,  Solid / almost completely solid, Isoechoic, TI-RADS total 3  Specimen Submitted:  A. THYROID, LEFT INFERIOR, FINE NEEDLE ASPIRATION:  FINAL MICROSCOPIC DIAGNOSIS:  - Atypia of undetermined significance (Bethesda category III)  SPECIMEN ADEQUACY:  Satisfactory for evaluation   Afirma: benign  She also has a history of HTN, depression/anxiety, osteoarthritis.  ROS: + see HPI No increased urination, blurry vision, nausea, chest pain.  Past Medical History:  Diagnosis Date   Anxiety    Arthritis    Cancer ([place])    Depression    Diabetes mellitus without complication ([place])    Hypertension    Hypothyroidism    Past Surgical History:  Procedure Laterality Date   ABDOMINAL HYSTERECTOMY     BACK SURGERY  2006   EXCISION MASS LOWER EXTREMETIES Left 07/11/2019   Procedure: EXCISION MASS foot;  Surgeon: Netta Cedars, MD;  Location: [facility];  Service: Orthopedics;  Laterality: Left;   GALLBLADDER SURGERY  2005   HERNIA REPAIR     KNEE SURGERY  2001/2005   REVERSE SHOULDER ARTHROPLASTY Left 04/18/2022   Procedure: REVERSE SHOULDER ARTHROPLASTY;   Surgeon: Netta Cedars, MD;  Location: [facility];  Service: Orthopedics;  Laterality: Left;  with ISB   Social History   Socioeconomic History   Marital status: Unknown    Spouse name: Not on file   Number of children: Not on file   Years of education: Not on file   Highest education level: Not on file  Occupational History   Not on file  Tobacco Use   Smoking status: Former    Packs/day: 0.05    Years: 3.00    Total pack years: 0.15    Types: Cigarettes    Quit date: 32    Years since quitting: 43.7   Smokeless tobacco: Never  Vaping Use   Vaping Use: Never used  Substance and Sexual Activity   Alcohol use: Never   Drug use: Never   Sexual activity: Not Currently  Other Topics Concern   Not on file  Social History Narrative   ** Merged History Encounter **       Social Determinants of Health   Financial Resource Strain: Not on file  Food Insecurity: Not on file  Transportation Needs: Not on file  Physical Activity: Not on file  Stress: Not on file  Social Connections: Not on file  Intimate Partner Violence: Not on file   Current Outpatient Medications on File Prior to Visit  Medication Sig Dispense Refill   acarbose (PRECOSE) 25 MG tablet TAKE 1 TABLET BY MOUTH 3 TIMES A DAY WITH MEALS 270 tablet 2   Accu-Chek FastClix Lancets MISC 1 each by Does not apply route daily. E11.9 90 each 3   ACCU-CHEK GUIDE test strip USE TO TEST BLOOD GLUCOSE 2 TIMES DAILY, AS INSTRUCTED. DX CODE: 250.00 200 strip 2   acetaminophen (TYLENOL) 325 MG tablet Take 650 mg by mouth every 6 (six) hours as needed for pain.     alprazolam (XANAX) 2 MG tablet Take 2 mg by mouth 2 (two) times daily.     atenolol (TENORMIN) 25 MG tablet Take 1 tablet (25 mg total) by mouth daily. (Patient not taking: Reported on 04/01/2022) 90 tablet 3   Calcium Citrate-Vitamin D (CALCIUM CITRATE + D PO) Take 1 tablet by mouth daily.     CRESTOR 5 MG tablet Take 5 mg by mouth daily.     diclofenac sodium (VOLTAREN)  1 % GEL Apply 4 g topically 4 (four) times daily as needed (LEG PAIN).     famotidine (PEPCID) 20 MG tablet Take 20 mg by mouth daily.     gabapentin (NEURONTIN) 300 MG capsule Take 300 mg by mouth 3 (three) times daily.     hydrochlorothiazide (MICROZIDE) 12.5 MG capsule Take 1 capsule (12.5 mg total) by mouth daily. (Patient not taking: Reported on 04/01/2022) 90 capsule 3   levothyroxine (SYNTHROID) 50 MCG tablet Take 1 tablet (50 mcg total) by mouth daily. 90 tablet 3   metFORMIN (GLUCOPHAGE-XR) 500 MG 24 hr tablet Take 4 tablets (2,000 mg total) by mouth daily with breakfast. (Patient taking differently: Take 1,000 mg by mouth in the morning and at bedtime.) 360 tablet 3   methocarbamol (ROBAXIN) 500 MG tablet Take 1 tablet (500 mg total) by mouth every 8 (eight) hours as needed for muscle spasms. 60 tablet 1   mirtazapine (REMERON) 15 MG tablet Take 15 mg by mouth at bedtime.     olmesartan (BENICAR) 5 MG tablet Take 5 mg by mouth daily.     oxyCODONE-acetaminophen (PERCOCET/ROXICET) 5-325 MG tablet Take 1-2 tablets by mouth every 4 (four) hours as needed for severe pain or moderate pain. 40 tablet 0   promethazine (PHENERGAN) 25 MG tablet Take 25 mg by mouth every 6 (six) hours as needed for nausea or vomiting.     repaglinide (PRANDIN) 2 MG tablet Take 1 tablet (2 mg total) by mouth 3 (three) times daily before meals. 270 tablet 3   Semaglutide (RYBELSUS) 14 MG TABS Take 1 tablet (14 mg total) by mouth daily. 90 tablet 3   Vitamin D, Ergocalciferol, (DRISDOL) 50000 UNITS CAPS capsule Take 50,000 Units by mouth every 7 (seven) days.     No current facility-administered medications on file prior to visit.   Allergies  Allergen Reactions   Aspirin Other (See Comments)    Pt states her PCP told her not to take aspirin but she can not remember exactly why.   Codeine Nausea And Vomiting   Hydrocodone Nausea And Vomiting   Latex Itching   Morphine And Related Nausea And Vomiting   Darvon  [Propoxyphene] Nausea And Vomiting   Family History  Problem Relation  Age of Onset   Arthritis Mother    Hyperlipidemia Mother    Heart disease Mother    Hypertension Mother    Diabetes Mother    Arthritis Father    Hyperlipidemia Father    Heart disease Father    Hypertension Father    Diabetes Father    Alcohol abuse Sister    Hypertension Sister    Cancer Sister    PE: BP 132/88 (BP Location: Right Arm, Patient Position: Sitting, Cuff Size: Normal)   Pulse 87   Ht '5\' 1"'$  (1.549 m)   Wt 162 lb 6.4 oz (73.7 kg)   SpO2 97%   BMI 30.69 kg/m  Wt Readings from Last 3 Encounters:  08/12/22 162 lb 6.4 oz (73.7 kg)  04/18/22 175 lb (79.4 kg)  04/07/22 175 lb (79.4 kg)   Constitutional: overweight, in NAD Eyes: no exophthalmos ENT: moist mucous membranes, no thyromegaly, no cervical lymphadenopathy Cardiovascular: RRR, No MRG Respiratory: CTA B Musculoskeletal: no deformities Skin: moist, warm, no rashes Neurological: no tremor with outstretched hands Diabetic Foot Exam - Simple   Simple Foot Form Diabetic Foot exam was performed with the following findings: Yes 08/12/2022 11:22 AM  Visual Inspection No deformities, no ulcerations, no other skin breakdown bilaterally: Yes Sensation Testing Intact to touch and monofilament testing bilaterally: Yes Pulse Check Posterior Tibialis and Dorsalis pulse intact bilaterally: Yes Comments    ASSESSMENT: 1. DM2, non-insulin-dependent, uncontrolled, with complications - PN  2.  Thyroid nodules  3.  Hypothyroidism  PLAN:  1. Patient with long-standing, uncontrolled diabetes, on oral antidiabetic regimen with metformin, meglitinide, acarbose, and GLP-1 receptor agonist, with improved control at last visit with Dr. Loanne [name], when HbA1c returned 7.0%.  At today's visit, HbA1c is 6.2% (improved). -At this visit patient is not checking blood sugars as it is difficult for her to do so with her left arm being in a sling.  She has been  more active as she is going to physical therapy.  Also she is not really hungry so she is eating less.  Indeed, she lost 13 pounds in the last 4 months.  She also describes episodes of weakness and swelling in the back of her head if she goes shopping but sometimes even moving around her house.  These episodes can happen at anytime of the day but not during the night.  We reviewed her meals and these are usually small with the exception of dinner, which is a TV dinner usually.  However, since she describes such episodes after dinner, also, at today's visit, I advised her to stop the repaglinide.  I advised her to check blood sugars when these episodes happen to see how low the sugars are. -Otherwise, for now, we could continue her acarbose, metformin, and Rybelsus.  She tolerates these well but she does have some nausea which she attributes to pain and pain medications. - I suggested to:  Patient Instructions  Please continue: - Metformin ER 500 mg daily - Acarbose 25 mg 3x a day - Rybelsus 14 mg in am  Stop Repaglinide.  Please continue levothyroxine 50 mcg daily  Take the thyroid hormone every day, with water, at least 30 minutes before breakfast, separated by at least 4 hours from: - acid reflux medications - calcium - iron - multivitamins  Move calcium and acid reflux medicines in the afternoon.  Please return in 4 months with your sugar log.   - check sugars at different times of the day - check 1x  a day, rotating checks - discussed about CBG targets for treatment: 80-130 mg/dL before meals and <180 mg/dL after meals; target HbA1c <7%. - given foot care handout  - given instructions for hypoglycemia management "15-15 rule"  - advised for yearly eye exams  - Return to clinic in 4 mo with sugar log   2.  Thyroid nodules -Patient with history of 3 thyroid nodules, without concerning features, but with 2 dominant nodules larger than 2 cm on the thyroid ultrasound from 2022 -The 2  dominant larger nodules were biopsied in 10/2021 with indeterminant results, however, the Afirma molecular marker returned benign for both nodules -No neck compression symptoms -We will continue to follow her expectantly for now  3.  Hypothyroidism - latest thyroid labs reviewed with pt. >> normal: Lab Results  Component Value Date   TSH 4.34 12/17/2021  - she continues on LT4 50 mcg daily - pt feels good on this dose. - we discussed about taking the thyroid hormone every day, with water, >30 minutes before breakfast, separated by >4 hours from acid reflux medications, calcium, iron, multivitamins. Pt. is not taking it correctly.  She is taking calcium and her Pepcid to close to levothyroxine in the morning >> advised her to move these in the afternoon. - will check thyroid tests at next OV  - Total time spent for the visit: 40 min, in precharting, reviewing Dr. Cordelia Pen last note, obtaining medical information from the chart and from the pt, reviewing her  previous labs, evaluations, and treatments, reviewing her symptoms, counseling her about her diabetes and thyroid conditions (please see the discussed topics above), and developing a plan to further treat them; she had a number of questions which I addressed  Jennifer Kingdom, MD PhD [facility]

## 2022-08-12 NOTE — Telephone Encounter (Signed)
Patient called in to let Dr. Cruzita Lederer know that on her med list , she is no longer taking :  Atenolol '25mg'$   2. Microzide 12.5 mg &. Remeron 15 mg

## 2022-08-13 DIAGNOSIS — M25612 Stiffness of left shoulder, not elsewhere classified: Secondary | ICD-10-CM | POA: Diagnosis not present

## 2022-08-13 DIAGNOSIS — M25512 Pain in left shoulder: Secondary | ICD-10-CM | POA: Diagnosis not present

## 2022-08-19 DIAGNOSIS — E782 Mixed hyperlipidemia: Secondary | ICD-10-CM | POA: Diagnosis not present

## 2022-08-19 DIAGNOSIS — E1169 Type 2 diabetes mellitus with other specified complication: Secondary | ICD-10-CM | POA: Diagnosis not present

## 2022-08-19 DIAGNOSIS — R634 Abnormal weight loss: Secondary | ICD-10-CM | POA: Diagnosis not present

## 2022-08-19 DIAGNOSIS — E039 Hypothyroidism, unspecified: Secondary | ICD-10-CM | POA: Diagnosis not present

## 2022-08-19 DIAGNOSIS — Z23 Encounter for immunization: Secondary | ICD-10-CM | POA: Diagnosis not present

## 2022-08-19 DIAGNOSIS — I10 Essential (primary) hypertension: Secondary | ICD-10-CM | POA: Diagnosis not present

## 2022-08-19 DIAGNOSIS — Z7984 Long term (current) use of oral hypoglycemic drugs: Secondary | ICD-10-CM | POA: Diagnosis not present

## 2022-08-19 DIAGNOSIS — E538 Deficiency of other specified B group vitamins: Secondary | ICD-10-CM | POA: Diagnosis not present

## 2022-08-19 DIAGNOSIS — J309 Allergic rhinitis, unspecified: Secondary | ICD-10-CM | POA: Diagnosis not present

## 2022-08-19 DIAGNOSIS — M25512 Pain in left shoulder: Secondary | ICD-10-CM | POA: Diagnosis not present

## 2022-08-19 DIAGNOSIS — E559 Vitamin D deficiency, unspecified: Secondary | ICD-10-CM | POA: Diagnosis not present

## 2022-08-19 DIAGNOSIS — M85852 Other specified disorders of bone density and structure, left thigh: Secondary | ICD-10-CM | POA: Diagnosis not present

## 2022-08-19 DIAGNOSIS — M25612 Stiffness of left shoulder, not elsewhere classified: Secondary | ICD-10-CM | POA: Diagnosis not present

## 2022-08-21 ENCOUNTER — Telehealth: Payer: Self-pay

## 2022-08-21 NOTE — Telephone Encounter (Signed)
Pt called to advise she had labs done at her PCP and she was advised her Thyroid levels may require adjustment in medication. Requested labs be faxed over. Contacted PCP office and provided correct fax number.

## 2022-08-25 ENCOUNTER — Other Ambulatory Visit: Payer: Self-pay | Admitting: Internal Medicine

## 2022-08-25 DIAGNOSIS — E039 Hypothyroidism, unspecified: Secondary | ICD-10-CM

## 2022-08-26 ENCOUNTER — Telehealth: Payer: Self-pay | Admitting: Internal Medicine

## 2022-08-26 DIAGNOSIS — M542 Cervicalgia: Secondary | ICD-10-CM | POA: Diagnosis not present

## 2022-08-26 DIAGNOSIS — Z4789 Encounter for other orthopedic aftercare: Secondary | ICD-10-CM | POA: Diagnosis not present

## 2022-08-26 NOTE — Telephone Encounter (Signed)
Patient has called for about 2 weeks. She has been asking if we received results of thyroid test done by primary. Went and spoke with Dr. Cruzita Lederer and she told me to tell her to stay on current dosage of Levothyroxine until her next appt with her. Patient seemed very confused and I called her friend on her DPR and explained . Friend stated her children have nothing to do with her. Patient called back at the end of the day and yelled at me and stated I never spoke to her. Was very aggravated and angry and exhibited extreme signs of memory loss. Roylene Reason

## 2022-09-01 NOTE — Telephone Encounter (Signed)
Error

## 2022-09-02 DIAGNOSIS — M25512 Pain in left shoulder: Secondary | ICD-10-CM | POA: Diagnosis not present

## 2022-09-02 DIAGNOSIS — M25612 Stiffness of left shoulder, not elsewhere classified: Secondary | ICD-10-CM | POA: Diagnosis not present

## 2022-09-09 DIAGNOSIS — M25512 Pain in left shoulder: Secondary | ICD-10-CM | POA: Diagnosis not present

## 2022-09-09 DIAGNOSIS — M25612 Stiffness of left shoulder, not elsewhere classified: Secondary | ICD-10-CM | POA: Diagnosis not present

## 2022-09-15 DIAGNOSIS — M79672 Pain in left foot: Secondary | ICD-10-CM | POA: Diagnosis not present

## 2022-09-16 DIAGNOSIS — M25512 Pain in left shoulder: Secondary | ICD-10-CM | POA: Diagnosis not present

## 2022-09-16 DIAGNOSIS — M25612 Stiffness of left shoulder, not elsewhere classified: Secondary | ICD-10-CM | POA: Diagnosis not present

## 2022-09-18 DIAGNOSIS — M25572 Pain in left ankle and joints of left foot: Secondary | ICD-10-CM | POA: Diagnosis not present

## 2022-09-18 DIAGNOSIS — M79672 Pain in left foot: Secondary | ICD-10-CM | POA: Diagnosis not present

## 2022-09-23 DIAGNOSIS — M25612 Stiffness of left shoulder, not elsewhere classified: Secondary | ICD-10-CM | POA: Diagnosis not present

## 2022-09-23 DIAGNOSIS — M25512 Pain in left shoulder: Secondary | ICD-10-CM | POA: Diagnosis not present

## 2022-09-24 ENCOUNTER — Other Ambulatory Visit: Payer: Medicare Other

## 2022-09-29 ENCOUNTER — Other Ambulatory Visit (INDEPENDENT_AMBULATORY_CARE_PROVIDER_SITE_OTHER): Payer: Medicare Other

## 2022-09-29 DIAGNOSIS — E039 Hypothyroidism, unspecified: Secondary | ICD-10-CM | POA: Diagnosis not present

## 2022-09-29 LAB — TSH: TSH: 1.59 u[IU]/mL (ref 0.35–5.50)

## 2022-09-29 LAB — T4, FREE: Free T4: 1.16 ng/dL (ref 0.60–1.60)

## 2022-09-30 ENCOUNTER — Telehealth: Payer: Self-pay | Admitting: Internal Medicine

## 2022-09-30 NOTE — Telephone Encounter (Signed)
Patient is calling for lab results from last visit.

## 2022-09-30 NOTE — Telephone Encounter (Signed)
As you mentioned, her thyroid tests are perfect.  Please continue the same dose of levothyroxine.  She def. needs to check her blood sugars, especially when feeling poorly.

## 2022-09-30 NOTE — Telephone Encounter (Signed)
Pt contacted and advised Thyroid tests are normal.  Please continue the same dose of levothyroxine. Pt states she has been having  some fatigue and unsure if its because of her thyroid or diabetes. PT states she was told her thyroid medication will have to be increased by her former provider and was expected an adjustment when her labs came back from Dr Moreen Fowler office. She was reminded her recent labs from 09/29/2022 show her results were back in range and did not require any adjustments. Pt can not identify when she is feeling unwell if its due to low blood sugars or not as she is not checking her blood sugar. Pt wants to adjust thyroid medication. Confirmed she is taking Rybelsus, Metformin, and Acarbose 3x daily.

## 2022-10-01 NOTE — Telephone Encounter (Signed)
Pt contacted the office to confirm she is to take the same dose of Levothyroxine. Confirmed she should be taking 50 mcg first thing in the morning separated from her other medications by at least 30 minutes. Pt confirmed understanding.

## 2022-10-05 DIAGNOSIS — M25572 Pain in left ankle and joints of left foot: Secondary | ICD-10-CM | POA: Diagnosis not present

## 2022-10-09 DIAGNOSIS — M25572 Pain in left ankle and joints of left foot: Secondary | ICD-10-CM | POA: Diagnosis not present

## 2022-10-09 DIAGNOSIS — M19072 Primary osteoarthritis, left ankle and foot: Secondary | ICD-10-CM | POA: Diagnosis not present

## 2022-11-06 DIAGNOSIS — M25572 Pain in left ankle and joints of left foot: Secondary | ICD-10-CM | POA: Diagnosis not present

## 2022-11-26 ENCOUNTER — Telehealth: Payer: Self-pay

## 2022-11-26 NOTE — Telephone Encounter (Signed)
Pt received notification prior auth would be required for Rybelsus for 2024.

## 2022-11-28 DIAGNOSIS — J209 Acute bronchitis, unspecified: Secondary | ICD-10-CM | POA: Diagnosis not present

## 2022-12-02 ENCOUNTER — Other Ambulatory Visit (HOSPITAL_COMMUNITY): Payer: Self-pay

## 2022-12-02 NOTE — Telephone Encounter (Signed)
Pharmacy Patient Advocate Encounter  Received notification from that prior authorization for Rybelsus is required/requested.  Per Test Claim: too soon to fill.   Messaged RMA to see if the pt received a letter from ins about a change.

## 2022-12-17 ENCOUNTER — Ambulatory Visit: Payer: Medicare Other | Admitting: Internal Medicine

## 2023-01-06 NOTE — Telephone Encounter (Signed)
F/u   The patient is checking on status of Rybelsus.

## 2023-01-07 ENCOUNTER — Other Ambulatory Visit (HOSPITAL_COMMUNITY): Payer: Self-pay

## 2023-01-07 DIAGNOSIS — M25572 Pain in left ankle and joints of left foot: Secondary | ICD-10-CM | POA: Diagnosis not present

## 2023-01-07 DIAGNOSIS — M542 Cervicalgia: Secondary | ICD-10-CM | POA: Diagnosis not present

## 2023-01-07 DIAGNOSIS — M47812 Spondylosis without myelopathy or radiculopathy, cervical region: Secondary | ICD-10-CM | POA: Diagnosis not present

## 2023-01-07 NOTE — Telephone Encounter (Signed)
Per test claim refill too soon until 02/25, no notice of non-formulary requiring PA at this time.

## 2023-01-12 ENCOUNTER — Ambulatory Visit: Payer: Medicare Other | Admitting: Internal Medicine

## 2023-01-20 ENCOUNTER — Encounter: Payer: Self-pay | Admitting: Internal Medicine

## 2023-01-20 ENCOUNTER — Ambulatory Visit: Payer: 59 | Admitting: Internal Medicine

## 2023-01-20 VITALS — BP 140/88 | HR 90 | Ht 61.0 in | Wt 151.4 lb

## 2023-01-20 DIAGNOSIS — E039 Hypothyroidism, unspecified: Secondary | ICD-10-CM

## 2023-01-20 DIAGNOSIS — E1142 Type 2 diabetes mellitus with diabetic polyneuropathy: Secondary | ICD-10-CM

## 2023-01-20 DIAGNOSIS — E1165 Type 2 diabetes mellitus with hyperglycemia: Secondary | ICD-10-CM | POA: Diagnosis not present

## 2023-01-20 DIAGNOSIS — E042 Nontoxic multinodular goiter: Secondary | ICD-10-CM

## 2023-01-20 LAB — POCT GLUCOSE (DEVICE FOR HOME USE): POC Glucose: 310 mg/dl — AB (ref 70–99)

## 2023-01-20 LAB — POCT GLYCOSYLATED HEMOGLOBIN (HGB A1C): Hemoglobin A1C: 9.8 % — AB (ref 4.0–5.6)

## 2023-01-20 MED ORDER — REPAGLINIDE 2 MG PO TABS: 2.0000 mg | ORAL_TABLET | Freq: Three times a day (TID) | ORAL | 3 refills | Status: DC

## 2023-01-20 NOTE — Progress Notes (Signed)
Patient ID: [name], female   DOB: 03-Aug-1941, 82 y.o.   MRN: [id number]  HPI: [name] is a 82 y.o.-year-old female, returning for follow-up for DM2, dx in 1993, non-insulin-dependent, uncontrolled, with complications (PN). Pt. previously saw Dr. Loanne Drilling, but last visit with me 5 months ago.  Interim history: No increased urination, blurry vision, nausea, chest pain. She still has L shoulder pain - was in PT, now does PT at home. She had steroid inj in neck since last OV.  She had a URI + bronchitis >> on ABx and steroids. She is eating mints frequently.  At last visit, she described episodes of weakness and dizziness along with swelling in the back of her head occasionally, with exertion. She did not check blood sugars during these episodes.  We stopped her Prandin at that time.  Reviewed HbA1c: Lab Results  Component Value Date   HGBA1C 6.2 (A) 08/12/2022   HGBA1C 7.0 (A) 04/07/2022   HGBA1C 7.9 (A) 02/18/2022   HGBA1C 9.6 (A) 12/17/2021   HGBA1C 8.3 (A) 10/11/2021   HGBA1C 7.0 (A) 04/25/2021   HGBA1C 7.0 (A) 01/24/2021   HGBA1C 7.7 (A) 02/06/2020   HGBA1C 7.5 (A) 11/04/2019   HGBA1C 6.3 (A) 12/07/2018   Pt is on a regimen of: - Metformin ER 500 mg 2x daily - Prandin 2 mg 3x a day >> stopped 08/2022 - Acarbose 25 mg 3x a day - Rybelsus 14 mg in am She tried bromocriptine >> glycemia. She tried Invokana >> dizziness. She tried [place] >> hypotension. She tried pioglitazone >> edema.  Pt checks her sugars seldom  a day - checked sugars seldom - not always: - am: 95-140s - 2h after b'fast: n/c - before lunch: n/c - 2h after lunch: n/c - before dinner: 145-150 - 2h after dinner: n/c - bedtime: n/c - nighttime: n/c Lowest sugar was ? (Episodes of weak and sweating, no tremors); she has hypoglycemia awareness at 70.  Highest sugar was 160 >> ?Marland Kitchen  Glucometer: Accu Check guide  Pt's meals are: - Breakfast: egg and toast; sometimes oatmeal, sometimes grits -  Lunch: pimento or bologna cheese sandwich - Dinner: TV dinner - Snacks: sugar free jello, mints  - no CKD, last BUN/creatinine:  Lab Results  Component Value Date   BUN 12 04/21/2022   BUN 10 04/19/2022   CREATININE 0.68 04/21/2022   CREATININE 0.62 04/19/2022  She is is on Benicar 5 mg daily.  - + HL; last set of lipids: No results found for: "CHOL", "HDL", "LDLCALC", "LDLDIRECT", "TRIG", "CHOLHDL" She is on Crestor 5 mg daily.  - last eye exam was >2 years ago. No DR.   - no numbness and tingling in her feet.  On gabapentin 300 mg 3 times a day.  Last foot exam 08/12/2022.  Hypothyroidism:  Pt is on levothyroxine 50 mcg daily, taken: - in am - fasting - immediately from b'fast -+ calcium either in am or in the pm - no iron - no multivitamins - no PPIs, + Pepcid in am >> pm - not on Biotin  Reviewed TSH levels: Lab Results  Component Value Date   TSH 1.59 09/29/2022   TSH 4.34 12/17/2021   TSH 2.21 04/25/2021  08/18/2022: TSH 5.43 (0.34-4.5)  Thyroid nodules:  Reviewed the report of her Thyroid U/S (09/06/2021): Parenchymal Echotexture: Mildly heterogeneous  Isthmus: 0.3 cm  Right lobe: 3.0 x 1.3 x 1.5 cm  Left lobe: 5.3 x 2.3 x 2.3 cm  _________________________________________________________   Nodule #  1:  Location: Right; inferior  Maximum size: 1.2 cm; Other 2 dimensions: 0.8 x 0.9 cm  Composition: solid/almost completely solid (2)  Echogenicity: isoechoic (1)  Given size (<1.4 cm) and appearance, this nodule does NOT meet TI-RADS criteria for biopsy or dedicated follow-up.  _________________________________________________________   Nodule # 2:  Location: Left; mid  Maximum size: 2.1 cm; Other 2 dimensions: 1.1 x 1.5 cm  Composition: solid/almost completely solid (2)  Echogenicity: hypoechoic (2)  **Given size (>/= 1.5 cm) and appearance, fine needle aspiration of this moderately suspicious nodule should be considered based on TI-RADS criteria.   _________________________________________________________   Nodule # 3:  Location: Left; inferior  Maximum size: 2.5 cm; Other 2 dimensions: 2.0 x 2.4 cm  Composition: solid/almost completely solid (2)  Echogenicity: isoechoic (1) **Given size (>/= 2.5 cm) and appearance, fine needle aspiration of this mildly suspicious nodule should be considered based on TI-RADS criteria.  _________________________________________________________   IMPRESSION: 1. Nodule 2 (TI-RADS 4) located in the mid left thyroid lobe meets criteria for FNA. 2. Nodule 3 (TI-RADS 3) located in the inferior left thyroid lobe meets criteria for FNA.  FNA of the 2 nodules (10/02/2021): Clinical History: Left mid 2.1cm; Other 2 dimensions: 1.1 x 1.5cm, Solid  / almost completely solid, Hypoechoic, TI-RADS total points 4  Specimen Submitted:  A. THYROID, LEFT MID, FINE NEEDLE ASPIRATION:  FINAL MICROSCOPIC DIAGNOSIS:  - Follicular lesion of undetermined significance (Bethesda category III)  SPECIMEN ADEQUACY:  Satisfactory for evaluation   Afirma: benign  Clinical History: Left inferior 2.5cm; Other 2 dimensions:2.0 x 2.4cm,  Solid / almost completely solid, Isoechoic, TI-RADS total 3  Specimen Submitted:  A. THYROID, LEFT INFERIOR, FINE NEEDLE ASPIRATION:  FINAL MICROSCOPIC DIAGNOSIS:  - Atypia of undetermined significance (Bethesda category III)  SPECIMEN ADEQUACY:  Satisfactory for evaluation   Afirma: benign  She also has a history of HTN, depression/anxiety, osteoarthritis.  ROS: + see HPI  Past Medical History:  Diagnosis Date   Anxiety    Arthritis    Cancer ([place])    Depression    Diabetes mellitus without complication ([place])    Hypertension    Hypothyroidism    Past Surgical History:  Procedure Laterality Date   ABDOMINAL HYSTERECTOMY     BACK SURGERY  2006   EXCISION MASS LOWER EXTREMETIES Left 07/11/2019   Procedure: EXCISION MASS foot;  Surgeon: Netta Cedars, MD;  Location: [facility];  Service: Orthopedics;  Laterality: Left;   GALLBLADDER SURGERY  2005   HERNIA REPAIR     KNEE SURGERY  2001/2005   REVERSE SHOULDER ARTHROPLASTY Left 04/18/2022   Procedure: REVERSE SHOULDER ARTHROPLASTY;  Surgeon: Netta Cedars, MD;  Location: [facility];  Service: Orthopedics;  Laterality: Left;  with ISB   Social History   Socioeconomic History   Marital status: Unknown    Spouse name: Not on file   Number of children: Not on file   Years of education: Not on file   Highest education level: Not on file  Occupational History   Not on file  Tobacco Use   Smoking status: Former    Packs/day: 0.05    Years: 3.00    Total pack years: 0.15    Types: Cigarettes    Quit date: 47    Years since quitting: 44.1   Smokeless tobacco: Never  Vaping Use   Vaping Use: Never used  Substance and Sexual Activity   Alcohol use: Never   Drug use: Never  Sexual activity: Not Currently  Other Topics Concern   Not on file  Social History Narrative   ** Merged History Encounter **       Social Determinants of Health   Financial Resource Strain: Not on file  Food Insecurity: Not on file  Transportation Needs: Not on file  Physical Activity: Not on file  Stress: Not on file  Social Connections: Not on file  Intimate Partner Violence: Not on file   Current Outpatient Medications on File Prior to Visit  Medication Sig Dispense Refill   acarbose (PRECOSE) 25 MG tablet TAKE 1 TABLET BY MOUTH 3 TIMES A DAY WITH MEALS 270 tablet 2   Accu-Chek FastClix Lancets MISC 1 each by Does not apply route daily. E11.9 90 each 3   ACCU-CHEK GUIDE test strip USE TO TEST BLOOD GLUCOSE 2 TIMES DAILY, AS INSTRUCTED. DX CODE: 250.00 200 strip 2   acetaminophen (TYLENOL) 325 MG tablet Take 650 mg by mouth every 6 (six) hours as needed for pain.     alprazolam (XANAX) 2 MG tablet Take 2 mg by mouth 2 (two) times daily.     Calcium Citrate-Vitamin D (CALCIUM CITRATE + D PO) Take 1 tablet by  mouth daily.     CRESTOR 5 MG tablet Take 5 mg by mouth daily.     diclofenac sodium (VOLTAREN) 1 % GEL Apply 4 g topically 4 (four) times daily as needed (LEG PAIN).     famotidine (PEPCID) 20 MG tablet Take 20 mg by mouth daily.     gabapentin (NEURONTIN) 300 MG capsule Take 300 mg by mouth 3 (three) times daily.     levothyroxine (SYNTHROID) 50 MCG tablet Take 1 tablet (50 mcg total) by mouth daily. 90 tablet 3   metFORMIN (GLUCOPHAGE-XR) 500 MG 24 hr tablet Take 1 tablet (500 mg total) by mouth in the morning and at bedtime. 180 tablet 3   methocarbamol (ROBAXIN) 500 MG tablet Take 1 tablet (500 mg total) by mouth every 8 (eight) hours as needed for muscle spasms. 60 tablet 1   olmesartan (BENICAR) 5 MG tablet Take 5 mg by mouth daily.     oxyCODONE-acetaminophen (PERCOCET/ROXICET) 5-325 MG tablet Take 1-2 tablets by mouth every 4 (four) hours as needed for severe pain or moderate pain. 40 tablet 0   promethazine (PHENERGAN) 25 MG tablet Take 25 mg by mouth every 6 (six) hours as needed for nausea or vomiting.     Semaglutide (RYBELSUS) 14 MG TABS Take 1 tablet (14 mg total) by mouth daily. 90 tablet 3   Vitamin D, Ergocalciferol, (DRISDOL) 50000 UNITS CAPS capsule Take 50,000 Units by mouth every 7 (seven) days.     No current facility-administered medications on file prior to visit.   Allergies  Allergen Reactions   Aspirin Other (See Comments)    Pt states her PCP told her not to take aspirin but she can not remember exactly why.   Codeine Nausea And Vomiting   Hydrocodone Nausea And Vomiting   Latex Itching   Morphine And Related Nausea And Vomiting   Darvon [Propoxyphene] Nausea And Vomiting   Family History  Problem Relation Age of Onset   Arthritis Mother    Hyperlipidemia Mother    Heart disease Mother    Hypertension Mother    Diabetes Mother    Arthritis Father    Hyperlipidemia Father    Heart disease Father    Hypertension Father    Diabetes Father    Alcohol  abuse Sister    Hypertension Sister    Cancer Sister    PE: BP (!) 140/88 (BP Location: Right Arm, Patient Position: Sitting, Cuff Size: Normal)   Pulse 90   Ht 5' 1"$  (1.549 m)   Wt 151 lb 6.4 oz (68.7 kg)   SpO2 98%   BMI 28.61 kg/m  Wt Readings from Last 3 Encounters:  01/20/23 151 lb 6.4 oz (68.7 kg)  08/12/22 162 lb 6.4 oz (73.7 kg)  04/18/22 175 lb (79.4 kg)   Constitutional: overweight, in NAD Eyes: no exophthalmos ENT: no thyromegaly, no cervical lymphadenopathy Cardiovascular: RRR, No MRG Respiratory: CTA B Musculoskeletal: no deformities Skin:  no rashes Neurological: no tremor with outstretched hands  ASSESSMENT: 1. DM2, non-insulin-dependent, uncontrolled, with complications - PN  2.  Thyroid nodules  3.  Hypothyroidism  PLAN:  1. Patient with longstanding, uncontrolled, type 2 diabetes, on oral antidiabetic regimen with metformin, glucosidase inhibitor, and GLP-1 receptor agonist, with great control at last visit, when HbA1c returned 6.2%.  At that time, she just lost 13 pounds and sugars were better.  I advised her to stop repaglinide. -However, at today's visit, she is not checking sugars consistently, and diabetes mellitus sugars are likely from before the holidays.  She recently had bronchitis and an upper respiratory infection and was on antibiotics and steroids.  She is also getting steroid injections.  She also eating mints, which can also increase her blood sugars.  We checked her blood sugar in the office today and this was in the 300s.  At this point, I suggested insulin, but she adamantly refuses.  In that case, I advised her to start back on repaglinide before every meal and check sugars consistently for the next month, after which we need to have her back for another visit.  I strongly encouraged her to stop the mints. - I suggested to:  Patient Instructions  Please continue: - Metformin ER 500 mg daily - Acarbose 25 mg 3x a day - Rybelsus 14 mg in  am  Restart: - Repaglinide 2 mg before the meals  Check blood sugars before meals and at bedtime.  Please continue levothyroxine 50 mcg daily  Take the thyroid hormone every day, with water, at least 30 minutes before breakfast, separated by at least 4 hours from: - acid reflux medications - calcium - iron - multivitamins  Please return in 1 month with your sugar log.   - we checked her HbA1c: 9.8% (much higher) - advised to check sugars at different times of the day - 3x a day, rotating check times - advised for yearly eye exams >> she is not UTD - return to clinic in 1 months  2.  Thyroid nodules -Patient with history of 3 thyroid nodules, without concerning features, but with 2 dominant nodules larger than 2 cm on the thyroid ultrasound from 2022 -The 2 dominant larger nodules were biopsied in 10/2021 with indeterminant results, however, the Afirma molecular marker returned benign for both nodules -No neck compression symptoms -Plan to repeat another ultrasound  at next OV  3.  Hypothyroidism - latest thyroid labs reviewed with pt. >> normal: Lab Results  Component Value Date   TSH 1.59 09/29/2022  - she continues on LT4 50 mcg daily - pt feels good on this dose. - we discussed about taking the thyroid hormone every day, with water, >30 minutes before breakfast, separated by >4 hours from acid reflux medications, calcium, iron, multivitamins. Pt. is taking it correctly now.  At last visit, I advised him to move the calcium and PPIs in the afternoon as she was taking them in the morning - will check thyroid tests at next OV  Philemon Kingdom, MD PhD [facility] Endocrinology

## 2023-01-20 NOTE — Patient Instructions (Addendum)
Please continue: - Metformin ER 500 mg daily - Acarbose 25 mg 3x a day - Rybelsus 14 mg in am  Restart: - Repaglinide 2 mg before the meals  Check blood sugars before meals and at bedtime.  Please continue levothyroxine 50 mcg daily  Take the thyroid hormone every day, with water, at least 30 minutes before breakfast, separated by at least 4 hours from: - acid reflux medications - calcium - iron - multivitamins  Please return in 1 month with your sugar log.     Marland Kitchen

## 2023-01-22 ENCOUNTER — Telehealth: Payer: Self-pay | Admitting: Internal Medicine

## 2023-01-22 DIAGNOSIS — E119 Type 2 diabetes mellitus without complications: Secondary | ICD-10-CM

## 2023-01-22 MED ORDER — ACCU-CHEK FASTCLIX LANCETS MISC: 3.0000 | Freq: Every day | 3 refills | Status: DC

## 2023-01-22 MED ORDER — ACCU-CHEK GUIDE VI STRP: ORAL_STRIP | 3 refills | Status: DC

## 2023-01-22 NOTE — Telephone Encounter (Signed)
MEDICATION: 1.) Accu-Chek Guide ACCU-CHEK GUIDE test strip 2.)  Accu-Chek FastClix Lancets Accu-Chek FastClix Lancets MISC  PHARMACY:    [facility] [id number] - [place], [place] - [address] AT [address] (Ph: [phone])    HAS THE PATIENT CONTACTED THEIR PHARMACY?  No  IS THIS A 90 DAY SUPPLY : Yes  IS PATIENT OUT OF MEDICATION: Yes  IF NOT; HOW MUCH IS LEFT:   LAST APPOINTMENT DATE: @2$ /20/2024  NEXT APPOINTMENT DATE:@3$ /22/2024  DO WE HAVE YOUR PERMISSION TO LEAVE A DETAILED MESSAGE?: Yes  OTHER COMMENTS:    **Let patient know to contact pharmacy at the end of the day to make sure medication is ready. **  ** Please notify patient to allow 48-72 hours to process**  **Encourage patient to contact the pharmacy for refills or they can request refills through [facility]**

## 2023-01-22 NOTE — Telephone Encounter (Signed)
Rx sent 

## 2023-02-17 ENCOUNTER — Other Ambulatory Visit: Payer: Self-pay

## 2023-02-17 ENCOUNTER — Telehealth: Payer: Self-pay

## 2023-02-17 NOTE — Telephone Encounter (Signed)
Inbound call from insurance advising PA needed for Rybelsus 14 mg.

## 2023-02-19 ENCOUNTER — Other Ambulatory Visit: Payer: Self-pay

## 2023-02-19 DIAGNOSIS — E782 Mixed hyperlipidemia: Secondary | ICD-10-CM | POA: Diagnosis not present

## 2023-02-19 DIAGNOSIS — E559 Vitamin D deficiency, unspecified: Secondary | ICD-10-CM | POA: Diagnosis not present

## 2023-02-19 DIAGNOSIS — M85852 Other specified disorders of bone density and structure, left thigh: Secondary | ICD-10-CM | POA: Diagnosis not present

## 2023-02-19 DIAGNOSIS — M79671 Pain in right foot: Secondary | ICD-10-CM | POA: Diagnosis not present

## 2023-02-19 DIAGNOSIS — E039 Hypothyroidism, unspecified: Secondary | ICD-10-CM | POA: Diagnosis not present

## 2023-02-19 DIAGNOSIS — E119 Type 2 diabetes mellitus without complications: Secondary | ICD-10-CM

## 2023-02-19 DIAGNOSIS — K219 Gastro-esophageal reflux disease without esophagitis: Secondary | ICD-10-CM | POA: Diagnosis not present

## 2023-02-19 DIAGNOSIS — I10 Essential (primary) hypertension: Secondary | ICD-10-CM | POA: Diagnosis not present

## 2023-02-19 DIAGNOSIS — E538 Deficiency of other specified B group vitamins: Secondary | ICD-10-CM | POA: Diagnosis not present

## 2023-02-19 MED ORDER — ACARBOSE 25 MG PO TABS: ORAL_TABLET | ORAL | 3 refills | Status: DC

## 2023-02-20 ENCOUNTER — Ambulatory Visit: Payer: 59 | Admitting: Internal Medicine

## 2023-02-20 ENCOUNTER — Encounter: Payer: Self-pay | Admitting: Internal Medicine

## 2023-02-20 VITALS — BP 148/90 | HR 96 | Ht 61.0 in | Wt 155.4 lb

## 2023-02-20 DIAGNOSIS — E042 Nontoxic multinodular goiter: Secondary | ICD-10-CM | POA: Diagnosis not present

## 2023-02-20 DIAGNOSIS — E119 Type 2 diabetes mellitus without complications: Secondary | ICD-10-CM

## 2023-02-20 DIAGNOSIS — E039 Hypothyroidism, unspecified: Secondary | ICD-10-CM

## 2023-02-20 MED ORDER — REPAGLINIDE 1 MG PO TABS: 1.0000 mg | ORAL_TABLET | Freq: Three times a day (TID) | ORAL | 3 refills | Status: DC

## 2023-02-20 NOTE — Progress Notes (Signed)
Patient ID: [name], female   DOB: 28-May-1941, 82 y.o.   MRN: [id number]  HPI: [name] is a 82 y.o.-year-old female, returning for follow-up for DM2, dx in 1993, non-insulin-dependent, uncontrolled, with complications (PN). Pt. previously saw Dr. Loanne Drilling, but last visit with me 1 month ago.  Interim history: No increased urination, blurry vision, nausea, chest pain. She still has L shoulder pain - was in PT, now does PT at home.  No more steroid injections. She stopped eating regular mints since last visit. Now eating sugars free mints.  At last visit, she described episodes of weakness and dizziness along with swelling in the back of her head occasionally, with exertion. She did not check blood sugars during these episodes.  We stopped her Prandin at that time.  However we had to restart afterwards.  Reviewed HbA1c: Lab Results  Component Value Date   HGBA1C 9.8 (A) 01/20/2023   HGBA1C 6.2 (A) 08/12/2022   HGBA1C 7.0 (A) 04/07/2022   HGBA1C 7.9 (A) 02/18/2022   HGBA1C 9.6 (A) 12/17/2021   HGBA1C 8.3 (A) 10/11/2021   HGBA1C 7.0 (A) 04/25/2021   HGBA1C 7.0 (A) 01/24/2021   HGBA1C 7.7 (A) 02/06/2020   HGBA1C 7.5 (A) 11/04/2019   Pt is on a regimen of: - Metformin ER 500 mg 2x daily - Prandin 2 mg 3x a day >> stopped 08/2022 >> restarted 01/2023 - feels this is lowering BGs too much - Acarbose 25 mg 3x a day - Rybelsus 14 mg in am She tried bromocriptine >> glycemia. She tried Invokana >> dizziness. She tried [place] >> hypotension. She tried pioglitazone >> edema.  Pt checks her sugars seldom  a day - checked sugars seldom - not always: - am: 95-140s >> 97-171 - 2h after b'fast: n/c - before lunch: n/c >> 119-178, 273 - 2h after lunch: n/c - before dinner: 145-150 >> 143-167, 200 - 2h after dinner: n/c - bedtime: n/c>> 116-184 - nighttime: n/c Lowest sugar was ? (Episodes of weak and sweating, no tremors) >> 97; she has hypoglycemia awareness at 70.  Highest sugar  was 160 >> 273  Glucometer: Accu Check guide  Pt's meals are: - Breakfast: egg and toast; sometimes oatmeal, sometimes grits - Lunch: pimento or bologna cheese sandwich - Dinner: TV dinner - Snacks: sugar free jello, mints  - no CKD, last BUN/creatinine:  Lab Results  Component Value Date   BUN 12 04/21/2022   BUN 10 04/19/2022   CREATININE 0.68 04/21/2022   CREATININE 0.62 04/19/2022  She is is on Benicar 5 mg daily.  - + HL; last set of lipids: No results found for: "CHOL", "HDL", "LDLCALC", "LDLDIRECT", "TRIG", "CHOLHDL" She is on Crestor 5 mg daily.  - last eye exam was >2 years ago. No DR.   - no numbness and tingling in her feet.  On gabapentin 300 mg 3 times a day.  Last foot exam 08/12/2022.  Hypothyroidism:  Pt is on levothyroxine 50 mcg daily, taken: - in am - fasting - immediately from b'fast -+ calcium either in am or in the pm - no iron - no multivitamins - no PPIs, + Pepcid in am >> pm - not on Biotin  Reviewed TSH levels: Lab Results  Component Value Date   TSH 1.59 09/29/2022   TSH 4.34 12/17/2021   TSH 2.21 04/25/2021  08/18/2022: TSH 5.43 (0.34-4.5)  Thyroid nodules:  Reviewed the report of her Thyroid U/S (09/06/2021): Parenchymal Echotexture: Mildly heterogeneous  Isthmus: 0.3 cm  Right  lobe: 3.0 x 1.3 x 1.5 cm  Left lobe: 5.3 x 2.3 x 2.3 cm  _________________________________________________________   Nodule # 1:  Location: Right; inferior  Maximum size: 1.2 cm; Other 2 dimensions: 0.8 x 0.9 cm  Composition: solid/almost completely solid (2)  Echogenicity: isoechoic (1)  Given size (<1.4 cm) and appearance, this nodule does NOT meet TI-RADS criteria for biopsy or dedicated follow-up.  _________________________________________________________   Nodule # 2:  Location: Left; mid  Maximum size: 2.1 cm; Other 2 dimensions: 1.1 x 1.5 cm  Composition: solid/almost completely solid (2)  Echogenicity: hypoechoic (2)  **Given size (>/= 1.5  cm) and appearance, fine needle aspiration of this moderately suspicious nodule should be considered based on TI-RADS criteria.  _________________________________________________________   Nodule # 3:  Location: Left; inferior  Maximum size: 2.5 cm; Other 2 dimensions: 2.0 x 2.4 cm  Composition: solid/almost completely solid (2)  Echogenicity: isoechoic (1) **Given size (>/= 2.5 cm) and appearance, fine needle aspiration of this mildly suspicious nodule should be considered based on TI-RADS criteria.  _________________________________________________________   IMPRESSION: 1. Nodule 2 (TI-RADS 4) located in the mid left thyroid lobe meets criteria for FNA. 2. Nodule 3 (TI-RADS 3) located in the inferior left thyroid lobe meets criteria for FNA.  FNA of the 2 nodules (10/02/2021): Clinical History: Left mid 2.1cm; Other 2 dimensions: 1.1 x 1.5cm, Solid  / almost completely solid, Hypoechoic, TI-RADS total points 4  Specimen Submitted:  A. THYROID, LEFT MID, FINE NEEDLE ASPIRATION:  FINAL MICROSCOPIC DIAGNOSIS:  - Follicular lesion of undetermined significance (Bethesda category III)  SPECIMEN ADEQUACY:  Satisfactory for evaluation   Afirma: benign  Clinical History: Left inferior 2.5cm; Other 2 dimensions:2.0 x 2.4cm,  Solid / almost completely solid, Isoechoic, TI-RADS total 3  Specimen Submitted:  A. THYROID, LEFT INFERIOR, FINE NEEDLE ASPIRATION:  FINAL MICROSCOPIC DIAGNOSIS:  - Atypia of undetermined significance (Bethesda category III)  SPECIMEN ADEQUACY:  Satisfactory for evaluation   Afirma: benign  She also has a history of HTN, depression/anxiety, osteoarthritis.  ROS: + see HPI  Past Medical History:  Diagnosis Date   Anxiety    Arthritis    Cancer ([place])    Depression    Diabetes mellitus without complication ([place])    Hypertension    Hypothyroidism    Past Surgical History:  Procedure Laterality Date   ABDOMINAL HYSTERECTOMY     BACK SURGERY  2006    EXCISION MASS LOWER EXTREMETIES Left 07/11/2019   Procedure: EXCISION MASS foot;  Surgeon: Netta Cedars, MD;  [facility];  Service: Orthopedics;  Laterality: Left;   GALLBLADDER SURGERY  2005   HERNIA REPAIR     KNEE SURGERY  2001/2005   REVERSE SHOULDER ARTHROPLASTY Left 04/18/2022   Procedure: REVERSE SHOULDER ARTHROPLASTY;  Surgeon: Netta Cedars, MD;  Location: [facility];  Service: Orthopedics;  Laterality: Left;  with ISB   Social History   Socioeconomic History   Marital status: Unknown    Spouse name: Not on file   Number of children: Not on file   Years of education: Not on file   Highest education level: Not on file  Occupational History   Not on file  Tobacco Use   Smoking status: Former    Packs/day: 0.05    Years: 3.00    Additional pack years: 0.00    Total pack years: 0.15    Types: Cigarettes    Quit date: 1980    Years since quitting: 46.2  Smokeless tobacco: Never  Vaping Use   Vaping Use: Never used  Substance and Sexual Activity   Alcohol use: Never   Drug use: Never   Sexual activity: Not Currently  Other Topics Concern   Not on file  Social History Narrative   ** Merged History Encounter **       Social Determinants of Health   Financial Resource Strain: Not on file  Food Insecurity: Not on file  Transportation Needs: Not on file  Physical Activity: Not on file  Stress: Not on file  Social Connections: Not on file  Intimate Partner Violence: Not on file   Current Outpatient Medications on File Prior to Visit  Medication Sig Dispense Refill   acarbose (PRECOSE) 25 MG tablet TAKE 1 TABLET BY MOUTH 3 TIMES A DAY WITH MEALS 270 tablet 3   Accu-Chek FastClix Lancets MISC 3 each by Does not apply route daily. E11.9 204 each 3   acetaminophen (TYLENOL) 325 MG tablet Take 650 mg by mouth every 6 (six) hours as needed for pain.     alprazolam (XANAX) 2 MG tablet Take 2 mg by mouth 2 (two) times daily.     Calcium  Citrate-Vitamin D (CALCIUM CITRATE + D PO) Take 1 tablet by mouth daily.     CRESTOR 5 MG tablet Take 5 mg by mouth daily.     diclofenac sodium (VOLTAREN) 1 % GEL Apply 4 g topically 4 (four) times daily as needed (LEG PAIN).     famotidine (PEPCID) 20 MG tablet Take 20 mg by mouth daily.     gabapentin (NEURONTIN) 300 MG capsule Take 300 mg by mouth 3 (three) times daily.     glucose blood (ACCU-CHEK GUIDE) test strip USE TO TEST BLOOD GLUCOSE 3 TIMES DAILY, AS INSTRUCTED. DX CODE: E11.9 300 strip 3   levothyroxine (SYNTHROID) 50 MCG tablet Take 1 tablet (50 mcg total) by mouth daily. 90 tablet 3   metFORMIN (GLUCOPHAGE-XR) 500 MG 24 hr tablet Take 1 tablet (500 mg total) by mouth in the morning and at bedtime. 180 tablet 3   methocarbamol (ROBAXIN) 500 MG tablet Take 1 tablet (500 mg total) by mouth every 8 (eight) hours as needed for muscle spasms. 60 tablet 1   olmesartan (BENICAR) 5 MG tablet Take 5 mg by mouth daily.     oxyCODONE-acetaminophen (PERCOCET/ROXICET) 5-325 MG tablet Take 1-2 tablets by mouth every 4 (four) hours as needed for severe pain or moderate pain. 40 tablet 0   promethazine (PHENERGAN) 25 MG tablet Take 25 mg by mouth every 6 (six) hours as needed for nausea or vomiting.     repaglinide (PRANDIN) 2 MG tablet Take 1 tablet (2 mg total) by mouth 3 (three) times daily before meals. 270 tablet 3   Semaglutide (RYBELSUS) 14 MG TABS Take 1 tablet (14 mg total) by mouth daily. 90 tablet 3   Vitamin D, Ergocalciferol, (DRISDOL) 50000 UNITS CAPS capsule Take 50,000 Units by mouth every 7 (seven) days.     No current facility-administered medications on file prior to visit.   Allergies  Allergen Reactions   Aspirin Other (See Comments)    Pt states her PCP told her not to take aspirin but she can not remember exactly why.   Codeine Nausea And Vomiting   Hydrocodone Nausea And Vomiting   Latex Itching   Morphine And Related Nausea And Vomiting   Darvon [Propoxyphene] Nausea  And Vomiting   Family History  Problem Relation Age of Onset  Arthritis Mother    Hyperlipidemia Mother    Heart disease Mother    Hypertension Mother    Diabetes Mother    Arthritis Father    Hyperlipidemia Father    Heart disease Father    Hypertension Father    Diabetes Father    Alcohol abuse Sister    Hypertension Sister    Cancer Sister    PE: BP (!) 148/90 (BP Location: Right Arm, Patient Position: Sitting, Cuff Size: Normal)   Pulse 96   Ht 5\' 1"  (1.549 m)   Wt 155 lb 6.4 oz (70.5 kg)   SpO2 99%   BMI 29.36 kg/m  Wt Readings from Last 3 Encounters:  02/20/23 155 lb 6.4 oz (70.5 kg)  01/20/23 151 lb 6.4 oz (68.7 kg)  08/12/22 162 lb 6.4 oz (73.7 kg)   Constitutional: overweight, in NAD Eyes: no exophthalmos ENT: no thyromegaly, no cervical lymphadenopathy Cardiovascular: RRR, No MRG Respiratory: CTA B Musculoskeletal: no deformities Skin:  no rashes Neurological: no tremor with outstretched hands  ASSESSMENT: 1. DM2, non-insulin-dependent, uncontrolled, with complications - PN  2.  Thyroid nodules  3.  Hypothyroidism  PLAN:  1. Patient with longstanding, uncontrolled, type 2 diabetes, on oral antidiabetic regimen with metformin, glucosidase inhibitor, GLP-1 receptor agonist, and meglitinide added at last visit.  At that time, sugars were much worse and HbA1c was 9.8%.  She was not checking sugars consistently and we discussed about starting to do so.  She also had bronchitis and respiratory infection and was on antibiotics and steroids.  She also getting steroid injections.  We discussed that this could have increased her blood sugars also.  She was eating mints, which could greatly increase her blood sugars and I advised him to stop.  Blood sugar in the office at that time was in the 300s.  At that point, I suggested insulin but she adamantly refused.  In that case, I advised her to make the above changes and to start back on repaglinide before every meal,  while checking sugars consistently for 1 month and then to return for another visit. -At today's visit, she brings a good blood sugar log and sugars appear to be improved compared to last visit.  The projected A1c is between 7 and 7.5%.  She does complain of feeling that her blood sugars are dropping after meals.  She did not actually catch low blood sugars, but I suspect that this may be the reason, since she did have this problem in the past.  Therefore, at today's visit, I advised her to reduce the Prandin doses to 1 mg before each meal.  Otherwise, we will continue the same regimen. - I suggested to:  Patient Instructions  Please continue: - Metformin ER 500 mg daily - Acarbose 25 mg 3x a day - Rybelsus 14 mg in am   Please reduce: - Prandin 1 mg 3x before meals  Please continue levothyroxine 50 mcg daily  Take the thyroid hormone every day, with water, at least 30 minutes before breakfast, separated by at least 4 hours from: - acid reflux medications - calcium - iron - multivitamins  Please return in 2-3 month with your sugar log.   - advised to check sugars at different times of the day - 3x a day, rotating check times - advised for yearly eye exams >> she is UTD - she had labs with PCP yesterday-will need to get the records - return to clinic in 2-3 months  2.  Thyroid  nodules -Patient with history of 3 thyroid nodules, without concerning features, but with 2 dominant nodules larger than 2 cm on the thyroid ultrasound from 2022 -The 2 dominant larger nodules were biopsied in 10/2021 with indeterminant results, however, the Afirma molecular marker returned benign for both nodules -No neck compression symptoms -We can recheck another ultrasound at the end of the year  3.  Hypothyroidism - latest thyroid labs reviewed with pt. >> normal: Lab Results  Component Value Date   TSH 1.59 09/29/2022  - she continues on LT4 50 mcg daily - pt feels good on this dose. - we discussed  about taking the thyroid hormone every day, with water, >30 minutes before breakfast, separated by >4 hours from acid reflux medications, calcium, iron, multivitamins. Pt. is taking it correctly.  Philemon Kingdom, MD PhD [facility]

## 2023-02-20 NOTE — Patient Instructions (Addendum)
Please continue: - Metformin ER 500 mg daily - Acarbose 25 mg 3x a day - Rybelsus 14 mg in am   Please reduce: - Prandin 1 mg 3x before meals  Please continue levothyroxine 50 mcg daily  Take the thyroid hormone every day, with water, at least 30 minutes before breakfast, separated by at least 4 hours from: - acid reflux medications - calcium - iron - multivitamins  Please return in 2-3 month with your sugar log.

## 2023-02-22 ENCOUNTER — Other Ambulatory Visit: Payer: Self-pay

## 2023-02-22 DIAGNOSIS — E119 Type 2 diabetes mellitus without complications: Secondary | ICD-10-CM

## 2023-02-22 MED ORDER — RYBELSUS 14 MG PO TABS: 14.0000 mg | ORAL_TABLET | Freq: Every day | ORAL | 3 refills | Status: DC

## 2023-02-26 ENCOUNTER — Other Ambulatory Visit (HOSPITAL_COMMUNITY): Payer: Self-pay

## 2023-03-10 DIAGNOSIS — M25512 Pain in left shoulder: Secondary | ICD-10-CM | POA: Diagnosis not present

## 2023-03-10 DIAGNOSIS — M25511 Pain in right shoulder: Secondary | ICD-10-CM | POA: Diagnosis not present

## 2023-03-10 DIAGNOSIS — M25572 Pain in left ankle and joints of left foot: Secondary | ICD-10-CM | POA: Diagnosis not present

## 2023-03-10 DIAGNOSIS — M7912 Myalgia of auxiliary muscles, head and neck: Secondary | ICD-10-CM | POA: Diagnosis not present

## 2023-04-01 ENCOUNTER — Telehealth: Payer: Self-pay | Admitting: Internal Medicine

## 2023-04-01 DIAGNOSIS — E119 Type 2 diabetes mellitus without complications: Secondary | ICD-10-CM

## 2023-04-01 MED ORDER — ACCU-CHEK GUIDE VI STRP: ORAL_STRIP | 5 refills | Status: DC

## 2023-04-01 NOTE — Telephone Encounter (Signed)
Pt contacted and advised her insurance may not pay for the supplies to check her blood sugar 4X a day when she is not on insulin. Pt asked when should she check her blood sugars and advised per provider instructions 3X daily alternating times. Pt advised that was too confusing for her and requested a time to check her sugars. Advised pt to check fasting blood sugar and check before bed and alternate before lunch and supper. Pt confirmed understanding.

## 2023-04-01 NOTE — Telephone Encounter (Signed)
MEDICATION:  Accu-Chek Guide glucose blood (ACCU-CHEK GUIDE) test strip  PHARMACY:  [id number] - [place], [place] - [address] AT [facility] OF [address] (Ph: [phone])   HAS THE PATIENT CONTACTED THEIR PHARMACY?  yes  IS THIS A 90 DAY SUPPLY : yes  IS PATIENT OUT OF MEDICATION: No  IF NOT; HOW MUCH IS LEFT: Will be out tomorrow  LAST APPOINTMENT DATE: @3 /22/2024  NEXT APPOINTMENT DATE:@7 /08/2023  DO WE HAVE YOUR PERMISSION TO LEAVE A DETAILED MESSAGE?: Yes  OTHER COMMENTS: Patients needs prescription to be for 4 times a day.  Patient states that Dr. Elvera Lennox changed her to checking blood 4 times a day.  Patient will be out running errands tomorrow and is requesting prescription filled for tomorrow.   **Let patient know to contact pharmacy at the end of the day to make sure medication is ready. **  ** Please notify patient to allow 48-72 hours to process**  **Encourage patient to contact the pharmacy for refills or they can request refills through [facility]**

## 2023-04-01 NOTE — Telephone Encounter (Signed)
Patient wants to speak with Dr. Elvera Lennox concerning checking her blood.  Patient is saying that she was told at her last visit that she was to check her blood 4 times a day-at breakfast, at lunch, at dinner, and at bed time.  Per clinical staff the chart notes say 3 times daily with rotating times.  Patient is very adamant and upset saying that the chart notes are wrong.

## 2023-04-01 NOTE — Telephone Encounter (Signed)
Rx sent to pharmacy   

## 2023-04-22 ENCOUNTER — Telehealth: Payer: Self-pay | Admitting: Internal Medicine

## 2023-04-22 DIAGNOSIS — E119 Type 2 diabetes mellitus without complications: Secondary | ICD-10-CM

## 2023-04-22 MED ORDER — RYBELSUS 14 MG PO TABS: 14.0000 mg | ORAL_TABLET | Freq: Every day | ORAL | 3 refills | Status: DC

## 2023-04-22 NOTE — Telephone Encounter (Signed)
MEDICATION: Rybelsus Semaglutide (RYBELSUS) 14 MG TABS  PHARMACY:  [id number] - [place],  - [address] AT [facility] OF [address] (Ph: [phone])   HAS THE PATIENT CONTACTED THEIR PHARMACY?  Yes  IS THIS A 90 DAY SUPPLY : Yes  IS PATIENT OUT OF MEDICATION:  No  IF NOT; HOW MUCH IS LEFT: "Has few pills left"  LAST APPOINTMENT DATE: @3 /22/2024  NEXT APPOINTMENT DATE:@7 /08/2023  DO WE HAVE YOUR PERMISSION TO LEAVE A DETAILED MESSAGE?:  Yes  OTHER COMMENTS: Patient states that pharmacy filled the last prescription back in March 2024 for 60 pills and no refills.  Patient states also that she would like this sent in ASAP as her sister will be making a trip to the pharmacy this afternoon.  I explained the below to her about allowing 48-72 hours.   **Let patient know to contact pharmacy at the end of the day to make sure medication is ready. **  ** Please notify patient to allow 48-72 hours to process**  **Encourage patient to contact the pharmacy for refills or they can request refills through [facility]**

## 2023-04-22 NOTE — Telephone Encounter (Signed)
Done

## 2023-05-04 ENCOUNTER — Telehealth: Payer: Self-pay | Admitting: Internal Medicine

## 2023-05-04 NOTE — Telephone Encounter (Signed)
Patient asking for a refill of her Metformin HCL ER 5 mg to be sent to [facility] on [address]. Please advie

## 2023-05-05 DIAGNOSIS — M47812 Spondylosis without myelopathy or radiculopathy, cervical region: Secondary | ICD-10-CM | POA: Diagnosis not present

## 2023-05-05 DIAGNOSIS — Z96612 Presence of left artificial shoulder joint: Secondary | ICD-10-CM | POA: Diagnosis not present

## 2023-05-05 DIAGNOSIS — M542 Cervicalgia: Secondary | ICD-10-CM | POA: Diagnosis not present

## 2023-05-07 ENCOUNTER — Other Ambulatory Visit: Payer: Self-pay

## 2023-05-07 MED ORDER — LEVOTHYROXINE SODIUM 50 MCG PO TABS: 50.0000 ug | ORAL_TABLET | Freq: Every day | ORAL | 0 refills | Status: DC

## 2023-06-05 ENCOUNTER — Ambulatory Visit: Payer: 59 | Admitting: Internal Medicine

## 2023-06-09 ENCOUNTER — Encounter: Payer: Self-pay | Admitting: Internal Medicine

## 2023-06-09 ENCOUNTER — Ambulatory Visit: Payer: 59 | Admitting: Internal Medicine

## 2023-06-09 VITALS — BP 122/70 | HR 84 | Ht 61.0 in | Wt 156.4 lb

## 2023-06-09 DIAGNOSIS — E042 Nontoxic multinodular goiter: Secondary | ICD-10-CM | POA: Diagnosis not present

## 2023-06-09 DIAGNOSIS — E119 Type 2 diabetes mellitus without complications: Secondary | ICD-10-CM | POA: Diagnosis not present

## 2023-06-09 DIAGNOSIS — E039 Hypothyroidism, unspecified: Secondary | ICD-10-CM

## 2023-06-09 DIAGNOSIS — Z7984 Long term (current) use of oral hypoglycemic drugs: Secondary | ICD-10-CM | POA: Diagnosis not present

## 2023-06-09 LAB — HEMOGLOBIN A1C: Hemoglobin A1C: 6.8

## 2023-06-09 MED ORDER — INSULIN LISPRO (1 UNIT DIAL) 100 UNIT/ML (KWIKPEN): 5.0000 [IU] | PEN_INJECTOR | Freq: Three times a day (TID) | SUBCUTANEOUS | 11 refills | Status: DC

## 2023-06-09 MED ORDER — INSULIN PEN NEEDLE 32G X 4 MM MISC: 3 refills | Status: DC

## 2023-06-09 NOTE — Progress Notes (Signed)
Patient ID: [name], female   DOB: Mar 12, 1941, 82 y.o.o.   MRN: [id number]  HPI: [name] is a 82 y.o.-year-old female, returning for follow-up for DM2, dx in 1993, non-insulin-dependent, uncontrolled, with complications (PN). Pt. previously saw Dr. Everardo All, but last visit with me 4 months ago.  Interim history: No increased urination, blurry vision, nausea, chest pain.  She is getting steroid injections. Sugars are higher after these. She is wondering if she needs to take insulin for these.  She brings an empty Lantus box with her. She feels her sugars are dropping too low after taking Prandin. She is very stressed as her sister and her nephew, which are her only family, both have stage IV cancer.  At last visit, she described episodes of weakness and dizziness along with swelling in the back of her head occasionally, with exertion. She did not check blood sugars during these episodes.  We stopped her Prandin at that time.  However we had to restart afterwards.  Reviewed HbA1c: Lab Results  Component Value Date   HGBA1C 9.8 (A) 01/20/2023   HGBA1C 6.2 (A) 08/12/2022   HGBA1C 7.0 (A) 04/07/2022   HGBA1C 7.9 (A) 02/18/2022   HGBA1C 9.6 (A) 12/17/2021   HGBA1C 8.3 (A) 10/11/2021   HGBA1C 7.0 (A) 04/25/2021   HGBA1C 7.0 (A) 01/24/2021   HGBA1C 7.7 (A) 02/06/2020   HGBA1C 7.5 (A) 11/04/2019   Pt is on a regimen of: - Metformin ER 500 mg 2x daily - Prandin 2 mg 3x a day >> stopped 08/2022 >> restarted 01/2023 >> 1 mg 3x a day - Acarbose 25 mg 3x a day - Rybelsus 14 mg in am She tried bromocriptine >> glycemia. She tried Invokana >> dizziness. She tried [place] >> hypotension. She tried pioglitazone >> edema.  Pt checks her sugars 0-1x a day: - am: 95-140s >> 97-171 >> 103-158, 163 - 2h after b'fast: n/c >> 158, 228 - before lunch: 119-178, 273 >> 116-163 >> 116-163 - 2h after lunch: n/c - before dinner: 145-150 >> 143-167, 200 >> 102-177, 201-263 (steroid) - 2h after  dinner: n/c - bedtime: n/c>> 116-184>> 127-180 - nighttime: n/c Lowest sugar was ? (Episodes of weak and sweating, no tremors) >> 97; she has hypoglycemia awareness at 70.  Highest sugar was 160 >> 273  Glucometer: Accu Check guide  Pt's meals are: - Breakfast: egg and toast; sometimes oatmeal, sometimes grits - Lunch: pimento or bologna cheese sandwich - Dinner: TV dinner - Snacks: sugar free jello, mints  - no CKD, last BUN/creatinine:  Lab Results  Component Value Date   BUN 12 04/21/2022   BUN 10 04/19/2022   CREATININE 0.68 04/21/2022   CREATININE 0.62 04/19/2022  She is is on Benicar 5 mg daily.  - + HL; last set of lipids: No results found for: "CHOL", "HDL", "LDLCALC", "LDLDIRECT", "TRIG", "CHOLHDL" She is on Crestor 5 mg daily.  - last eye exam was >2 years ago. No DR.   - no numbness and tingling in her feet.  On gabapentin 300 mg 3 times a day.  Last foot exam 08/12/2022.  Hypothyroidism:  Pt is on levothyroxine 50 mcg daily, taken: - in am - fasting - immediately from b'fast -+ calcium either in am or in the pm - no iron - no multivitamins - no PPIs, + Pepcid in am >> pm - not on Biotin  Reviewed TSH levels: Lab Results  Component Value Date   TSH 1.59 09/29/2022   TSH 4.34 12/17/2021  TSH 2.21 04/25/2021  08/18/2022: TSH 5.43 (0.34-4.5)  Thyroid nodules:  Reviewed the report of her Thyroid U/S (09/06/2021): Parenchymal Echotexture: Mildly heterogeneous  Isthmus: 0.3 cm  Right lobe: 3.0 x 1.3 x 1.5 cm  Left lobe: 5.3 x 2.3 x 2.3 cm  _________________________________________________________   Nodule # 1:  Location: Right; inferior  Maximum size: 1.2 cm; Other 2 dimensions: 0.8 x 0.9 cm  Composition: solid/almost completely solid (2)  Echogenicity: isoechoic (1)  Given size (<1.4 cm) and appearance, this nodule does NOT meet TI-RADS criteria for biopsy or dedicated follow-up.  _________________________________________________________    Nodule # 2:  Location: Left; mid  Maximum size: 2.1 cm; Other 2 dimensions: 1.1 x 1.5 cm  Composition: solid/almost completely solid (2)  Echogenicity: hypoechoic (2)  **Given size (>/= 1.5 cm) and appearance, fine needle aspiration of this moderately suspicious nodule should be considered based on TI-RADS criteria.  _________________________________________________________   Nodule # 3:  Location: Left; inferior  Maximum size: 2.5 cm; Other 2 dimensions: 2.0 x 2.4 cm  Composition: solid/almost completely solid (2)  Echogenicity: isoechoic (1) **Given size (>/= 2.5 cm) and appearance, fine needle aspiration of this mildly suspicious nodule should be considered based on TI-RADS criteria.  _________________________________________________________   IMPRESSION: 1. Nodule 2 (TI-RADS 4) located in the mid left thyroid lobe meets criteria for FNA. 2. Nodule 3 (TI-RADS 3) located in the inferior left thyroid lobe meets criteria for FNA.  FNA of the 2 nodules (10/02/2021): Clinical History: Left mid 2.1cm; Other 2 dimensions: 1.1 x 1.5cm, Solid  / almost completely solid, Hypoechoic, TI-RADS total points 4  Specimen Submitted:  A. THYROID, LEFT MID, FINE NEEDLE ASPIRATION:  FINAL MICROSCOPIC DIAGNOSIS:  - Follicular lesion of undetermined significance (Bethesda category III)  SPECIMEN ADEQUACY:  Satisfactory for evaluation   Afirma: benign  Clinical History: Left inferior 2.5cm; Other 2 dimensions:2.0 x 2.4cm,  Solid / almost completely solid, Isoechoic, TI-RADS total 3  Specimen Submitted:  A. THYROID, LEFT INFERIOR, FINE NEEDLE ASPIRATION:  FINAL MICROSCOPIC DIAGNOSIS:  - Atypia of undetermined significance (Bethesda category III)  SPECIMEN ADEQUACY:  Satisfactory for evaluation   Afirma: benign  She also has a history of HTN, depression/anxiety, osteoarthritis.  ROS: + see HPI  Past Medical History:  Diagnosis Date   Anxiety    Arthritis    Cancer (HCC)     Depression    Diabetes mellitus without complication (HCC)    Hypertension    Hypothyroidism    Past Surgical History:  Procedure Laterality Date   ABDOMINAL HYSTERECTOMY     BACK SURGERY  2006   EXCISION MASS LOWER EXTREMETIES Left 07/11/2019   Procedure: EXCISION MASS foot;  Surgeon: Beverely Low, MD;  Location: [facility];  Service: Orthopedics;  Laterality: Left;   GALLBLADDER SURGERY  2005   HERNIA REPAIR     KNEE SURGERY  2001/2005   REVERSE SHOULDER ARTHROPLASTY Left 04/18/2022   Procedure: REVERSE SHOULDER ARTHROPLASTY;  Surgeon: Beverely Low, MD;  Location: [facility];  Service: Orthopedics;  Laterality: Left;  with ISB   Social History   Socioeconomic History   Marital status: Unknown    Spouse name: Not on file   Number of children: Not on file   Years of education: Not on file   Highest education level: Not on file  Occupational History   Not on file  Tobacco Use   Smoking status: Former    Packs/day: 0.05    Years: 3.00    Additional  pack years: 0.00    Total pack years: 0.15    Types: Cigarettes    Quit date: 45    Years since quitting: 44.5   Smokeless tobacco: Never  Vaping Use   Vaping Use: Never used  Substance and Sexual Activity   Alcohol use: Never   Drug use: Never   Sexual activity: Not Currently  Other Topics Concern   Not on file  Social History Narrative   ** Merged History Encounter **       Social Determinants of Health   Financial Resource Strain: Not on file  Food Insecurity: Not on file  Transportation Needs: Not on file  Physical Activity: Not on file  Stress: Not on file  Social Connections: Not on file  Intimate Partner Violence: Not on file   Current Outpatient Medications on File Prior to Visit  Medication Sig Dispense Refill   acarbose (PRECOSE) 25 MG tablet TAKE 1 TABLET BY MOUTH 3 TIMES A DAY WITH MEALS 270 tablet 3   Accu-Chek FastClix Lancets MISC 3 each by Does not apply route daily. E11.9 204 each 3    acetaminophen (TYLENOL) 325 MG tablet Take 650 mg by mouth every 6 (six) hours as needed for pain.     alprazolam (XANAX) 2 MG tablet Take 2 mg by mouth 2 (two) times daily.     Calcium Citrate-Vitamin D (CALCIUM CITRATE + D PO) Take 1 tablet by mouth daily.     CRESTOR 5 MG tablet Take 5 mg by mouth daily.     diclofenac sodium (VOLTAREN) 1 % GEL Apply 4 g topically 4 (four) times daily as needed (LEG PAIN).     famotidine (PEPCID) 20 MG tablet Take 20 mg by mouth daily.     gabapentin (NEURONTIN) 300 MG capsule Take 300 mg by mouth 3 (three) times daily.     glucose blood (ACCU-CHEK GUIDE) test strip USE TO TEST BLOOD GLUCOSE 3 TIMES DAILY, AS INSTRUCTED. DX CODE: E11.9 300 strip 5   levothyroxine (SYNTHROID) 50 MCG tablet Take 1 tablet (50 mcg total) by mouth daily. 90 tablet 0   metFORMIN (GLUCOPHAGE-XR) 500 MG 24 hr tablet Take 1 tablet (500 mg total) by mouth in the morning and at bedtime. 180 tablet 3   methocarbamol (ROBAXIN) 500 MG tablet Take 1 tablet (500 mg total) by mouth every 8 (eight) hours as needed for muscle spasms. 60 tablet 1   olmesartan (BENICAR) 5 MG tablet Take 5 mg by mouth daily.     oxyCODONE-acetaminophen (PERCOCET/ROXICET) 5-325 MG tablet Take 1-2 tablets by mouth every 4 (four) hours as needed for severe pain or moderate pain. 40 tablet 0   promethazine (PHENERGAN) 25 MG tablet Take 25 mg by mouth every 6 (six) hours as needed for nausea or vomiting.     repaglinide (PRANDIN) 1 MG tablet Take 1 tablet (1 mg total) by mouth 3 (three) times daily before meals. 270 tablet 3   Semaglutide (RYBELSUS) 14 MG TABS Take 1 tablet (14 mg total) by mouth daily. 90 tablet 3   Vitamin D, Ergocalciferol, (DRISDOL) 50000 UNITS CAPS capsule Take 50,000 Units by mouth every 7 (seven) days.     No current facility-administered medications on file prior to visit.   Allergies  Allergen Reactions   Aspirin Other (See Comments)    Pt states her PCP told her not to take aspirin but  she can not remember exactly why.   Codeine Nausea And Vomiting   Hydrocodone Nausea And  Vomiting   Latex Itching   Morphine And Codeine Nausea And Vomiting   Darvon [Propoxyphene] Nausea And Vomiting   Family History  Problem Relation Age of Onset   Arthritis Mother    Hyperlipidemia Mother    Heart disease Mother    Hypertension Mother    Diabetes Mother    Arthritis Father    Hyperlipidemia Father    Heart disease Father    Hypertension Father    Diabetes Father    Alcohol abuse Sister    Hypertension Sister    Cancer Sister    PE: BP 122/70   Pulse 84   Ht 5\' 1"  (1.549 m)   Wt 156 lb 6.4 oz (70.9 kg)   SpO2 99%   BMI 29.55 kg/m  Wt Readings from Last 3 Encounters:  06/09/23 156 lb 6.4 oz (70.9 kg)  02/20/23 155 lb 6.4 oz (70.5 kg)  01/20/23 151 lb 6.4 oz (68.7 kg)   Constitutional: overweight, in NAD Eyes: no exophthalmos ENT: no thyromegaly, no cervical lymphadenopathy Cardiovascular: RRR, No MRG Respiratory: CTA B Musculoskeletal: no deformities Skin:  no rashes Neurological: no tremor with outstretched hands  ASSESSMENT: 1. DM2, non-insulin-dependent, uncontrolled, with complications - PN  2.  Thyroid nodules  3.  Hypothyroidism  PLAN:  1. Patient with standing, uncontrolled, type 2 diabetes, on oral antidiabetic regimen with metformin, glucosidase inhibitor, GLP-1 receptor agonist and meglitinide.  At last visit, per review of her blood sugar log, sugars are much improved, with the projected HbA1c between 7 and 7.5%, which would have been lower than previous visit, when it was 9.8%.  At that time, he felt that her sugars were dropping after meals and I advised her to reduce the dose of Prandin from 2 to 1 mg before meals.  We did not change the rest of the regimen.  Of note, I did suggest insulin when her HbA1c was 9.8% but she adamantly refused. -At today's visit, sugars are at or slightly above target, we do lows blood sugars in the low 100s, however,  she mentions that she does feel hypoglycemic when her blood sugars are 102 or 103.  Therefore, at today's visit, we will try to stop Prandin completely.  Sugars do appear to increase to 200s when she takes a steroid injection and I advised her that she does need insulin, but not long-acting insulin, but rapid acting insulin before meals after an injection.  She agrees to try this.  I advised that the dose can be changed based on the sugars after the meals. - I suggested to:  Patient Instructions  Please continue: - Metformin ER 500 mg 2x daily - Acarbose 25 mg 3x a day - Rybelsus 14 mg in am  Stop: - Prandin (Repaglinide)  When you have a steroid injection, use: - Humalog 5-6 units before meals after you get the steroid injections.   Please continue levothyroxine 50 mcg daily   Take the thyroid hormone every day, with water, at least 30 minutes before breakfast, separated by at least 4 hours from: - acid reflux medications - calcium - iron - multivitamins   Please return in 3 month with your sugar log.   - we checked her HbA1c: 6.8% (lower) - advised to check sugars at different times of the day - 1x a day, rotating check times - advised for yearly eye exams >> she is UTD - she has labs pending at the time of her annual physical exam with PCP, which is coming  up - return to clinic in 3 months  2.  Thyroid nodules -Patient with history of 3 thyroid nodules, without concerning features, but with 2 dominant nodules larger than 2 cm on the thyroid ultrasound from 2022 -The 2 dominant larger nodules were biopsied in 10/2021 with indeterminant results, however, Afirma molecular marker returned benign for both nodules -No neck compression symptoms -We can recheck another ultrasound at next visit  3.  Hypothyroidism - latest thyroid labs reviewed with pt. >> normal: Lab Results  Component Value Date   TSH 1.59 09/29/2022  - she continues on LT4 50 mcg daily - pt feels good on this  dose. - we discussed about taking the thyroid hormone every day, with water, >30 minutes before breakfast, separated by >4 hours from acid reflux medications, calcium, iron, multivitamins. Pt. is taking it correctly. - will check thyroid tests at next visit  Carlus Pavlov, MD PhD [facility]

## 2023-06-09 NOTE — Patient Instructions (Addendum)
Please continue: - Metformin ER 500 mg 2x daily - Acarbose 25 mg 3x a day - Rybelsus 14 mg in am  Stop: - Prandin (Repaglinide)  When you have a steroid injection, use: - Humalog 5-6 units before meals after you get the steroid injections.   Please continue levothyroxine 50 mcg daily   Take the thyroid hormone every day, with water, at least 30 minutes before breakfast, separated by at least 4 hours from: - acid reflux medications - calcium - iron - multivitamins   Please return in 3 month with your sugar log.

## 2023-06-12 ENCOUNTER — Other Ambulatory Visit: Payer: Self-pay

## 2023-06-12 ENCOUNTER — Telehealth: Payer: Self-pay | Admitting: Internal Medicine

## 2023-06-12 DIAGNOSIS — E119 Type 2 diabetes mellitus without complications: Secondary | ICD-10-CM

## 2023-06-12 MED ORDER — INSULIN LISPRO (1 UNIT DIAL) 100 UNIT/ML (KWIKPEN): 5.0000 [IU] | PEN_INJECTOR | Freq: Three times a day (TID) | SUBCUTANEOUS | 11 refills | Status: DC

## 2023-06-12 NOTE — Telephone Encounter (Signed)
Patient is calling to say that the prescription for  insulin lispro insulin lispro (HUMALOG KWIKPEN) 100 UNIT/ML KwikPen Was sent in to the wrong pharmacy.  Patient states that she uses   [id number] - [place], [place] - [address] AT [facility] OF [address] (Ph: [phone])   And NOT  [id number] - [place],  - [address] AT [address] (Ph: [phone])    Patient states that she needs the prescription before this weekend.

## 2023-06-12 NOTE — Telephone Encounter (Signed)
Humalog has been sent to [facility]

## 2023-06-12 NOTE — Telephone Encounter (Signed)
Requested Prescriptions   Signed Prescriptions Disp Refills   insulin lispro (HUMALOG KWIKPEN) 100 UNIT/ML KwikPen 15 mL 11    Sig: Inject 5-6 Units into the skin 3 (three) times daily.    Authorizing Provider: Carlus Pavlov    Ordering User: Pollie Meyer   Rx sent to [facility].

## 2023-07-07 DIAGNOSIS — M542 Cervicalgia: Secondary | ICD-10-CM | POA: Diagnosis not present

## 2023-07-07 DIAGNOSIS — M47812 Spondylosis without myelopathy or radiculopathy, cervical region: Secondary | ICD-10-CM | POA: Diagnosis not present

## 2023-08-05 ENCOUNTER — Other Ambulatory Visit: Payer: Self-pay | Admitting: Internal Medicine

## 2023-08-11 ENCOUNTER — Other Ambulatory Visit: Payer: Self-pay | Admitting: Internal Medicine

## 2023-09-08 DIAGNOSIS — M25512 Pain in left shoulder: Secondary | ICD-10-CM | POA: Diagnosis not present

## 2023-09-08 DIAGNOSIS — M25511 Pain in right shoulder: Secondary | ICD-10-CM | POA: Diagnosis not present

## 2023-09-08 DIAGNOSIS — M791 Myalgia, unspecified site: Secondary | ICD-10-CM | POA: Diagnosis not present

## 2023-09-08 DIAGNOSIS — M542 Cervicalgia: Secondary | ICD-10-CM | POA: Diagnosis not present

## 2023-09-16 ENCOUNTER — Telehealth: Payer: Self-pay | Admitting: Internal Medicine

## 2023-09-16 NOTE — Telephone Encounter (Signed)
MEDICATION: Metformin HCLER 500mg  b.I.d.  PHARMACY:    [id number] - [place], [place] - [address] AT [facility] OF [address] (Ph: [phone])    HAS THE PATIENT CONTACTED THEIR PHARMACY?  Yes  IS THIS A 90 DAY SUPPLY : Yes  IS PATIENT OUT OF MEDICATION: Yes  IF NOT; HOW MUCH IS LEFT:   LAST APPOINTMENT DATE: @7 /08/2023  NEXT APPOINTMENT DATE:@11 /13/2024  DO WE HAVE YOUR PERMISSION TO LEAVE A DETAILED MESSAGE?:Yes  OTHER COMMENTS: Patient states that Dr. Everardo All prescribed this medication & she had some left over & has been taking it & now she is out so she wants a refill.   **Let patient know to contact pharmacy at the end of the day to make sure medication is ready. **  ** Please notify patient to allow 48-72 hours to process**  **Encourage patient to contact the pharmacy for refills or they can request refills through [facility]**

## 2023-09-17 ENCOUNTER — Other Ambulatory Visit: Payer: Self-pay

## 2023-09-17 ENCOUNTER — Other Ambulatory Visit: Payer: Self-pay | Admitting: Family Medicine

## 2023-09-17 DIAGNOSIS — Z23 Encounter for immunization: Secondary | ICD-10-CM | POA: Diagnosis not present

## 2023-09-17 DIAGNOSIS — E538 Deficiency of other specified B group vitamins: Secondary | ICD-10-CM | POA: Diagnosis not present

## 2023-09-17 DIAGNOSIS — E1165 Type 2 diabetes mellitus with hyperglycemia: Secondary | ICD-10-CM | POA: Diagnosis not present

## 2023-09-17 DIAGNOSIS — Z0000 Encounter for general adult medical examination without abnormal findings: Secondary | ICD-10-CM | POA: Diagnosis not present

## 2023-09-17 DIAGNOSIS — M85852 Other specified disorders of bone density and structure, left thigh: Secondary | ICD-10-CM | POA: Diagnosis not present

## 2023-09-17 DIAGNOSIS — E559 Vitamin D deficiency, unspecified: Secondary | ICD-10-CM | POA: Diagnosis not present

## 2023-09-17 DIAGNOSIS — E039 Hypothyroidism, unspecified: Secondary | ICD-10-CM | POA: Diagnosis not present

## 2023-09-17 DIAGNOSIS — I10 Essential (primary) hypertension: Secondary | ICD-10-CM | POA: Diagnosis not present

## 2023-09-17 DIAGNOSIS — E782 Mixed hyperlipidemia: Secondary | ICD-10-CM | POA: Diagnosis not present

## 2023-09-17 DIAGNOSIS — R002 Palpitations: Secondary | ICD-10-CM | POA: Diagnosis not present

## 2023-09-17 MED ORDER — METFORMIN HCL ER 500 MG PO TB24: 500.0000 mg | ORAL_TABLET | Freq: Two times a day (BID) | ORAL | Status: DC

## 2023-09-17 NOTE — Telephone Encounter (Signed)
MEDICATION: Metformin HCLER 500mg  b.I.d.   PHARMACY:    [id number] - [place], [place] - [address] AT [facility] OF [address] (Ph: [phone])    Metformin has been sent to [facility]

## 2023-10-01 ENCOUNTER — Telehealth: Payer: Self-pay | Admitting: Cardiovascular Disease

## 2023-10-01 NOTE — Telephone Encounter (Signed)
Spoke with pt, she reports the episodes are coming closer together. She saw dr Eldridge Dace about 15 years ago.she was appointment was moved up to tomorrow. She will bring all her medications and blood pressure machine to that appointment. She was advised to go to the ER if she were to pass out.

## 2023-10-01 NOTE — Telephone Encounter (Signed)
STAT if patient feels like he/she is going to faint   1. Are you feeling dizzy, lightheaded, or faint right now? At time  2. Have you passed out?    No    (If yes move to .SYNCOPECHMG)  3. Do you have any other symptoms?  A little headache, sweating and weakness  4. Have you checked your HR and BP (record if available)?   BP 122/69  HR 98  Caller stated patient's HR has got up to as high as 100.  Caller stated patient's episodes are occurring more frequently.

## 2023-10-01 NOTE — Progress Notes (Signed)
Cardiology Office Note:  .   Date:  10/02/2023  ID:  [name], DOB 10-Apr-1941, MRN [id number] PCP: Tally Joe, MD  [facility] Providers Cardiologist:  Reatha Harps, MD { History of Present Illness: .   [name] is a 82 y.o. female with history of DM, HTN, HLD who presents for the evaluation of palpitations at the request of Tally Joe, MD.   Discussed the use of AI scribe software for clinical note transcription with the patient, who gave verbal consent to proceed.  History of Present Illness   [name], an 82 year old with a history of diabetes, hypertension, and hyperlipidemia, presents with increasing episodes of palpitations over the past few months. These episodes are characterized by lightheadedness, a sensation of impending syncope, sweating, and a feeling of her heart racing. The frequency of these episodes has been increasing, with the patient experiencing three episodes in one day recently. The patient denies any chest pain but does describe a sensation of heaviness on her chest during these episodes. She also reports shortness of breath. No syncope. Symptoms worse over 2-3 months.   The patient's diabetes control has been suboptimal recently, with her most recent HbA1c being 9.8. She also has a history of thyroid issues and is on several medications including Xanax and gabapentin. The patient has not had any recent surgeries but has a history of hysterectomy, back surgery, knee surgery, and hernia operation. She denies any recent fevers or chills.          Problem List DM -A1c 9.8 HLD -T chol 183, HDL 61, LDL 89, TG 202 HTN    ROS: All other ROS reviewed and negative. Pertinent positives noted in the HPI.     Studies Reviewed: Marland Kitchen   EKG Interpretation Date/Time:  Friday October 02 2023 09:57:43 EDT Ventricular Rate:  83 PR Interval:  162 QRS Duration:  76 QT Interval:  376 QTC Calculation: 441 R Axis:   -7  Text Interpretation: Normal  sinus rhythm Low voltage QRS Confirmed by Lennie Odor [phone]) on 10/02/2023 10:03:27 AM   Physical Exam:   VS:  Pulse 83   Ht 5\' 1"  (1.549 m)   Wt 155 lb 9.6 oz (70.6 kg)   SpO2 99%   BMI 29.40 kg/m    Wt Readings from Last 3 Encounters:  10/02/23 155 lb 9.6 oz (70.6 kg)  06/09/23 156 lb 6.4 oz (70.9 kg)  02/20/23 155 lb 6.4 oz (70.5 kg)    GEN: Well nourished, well developed in no acute distress NECK: No JVD; No carotid bruits CARDIAC: RRR, no murmurs, rubs, gallops RESPIRATORY:  Clear to auscultation without rales, wheezing or rhonchi  ABDOMEN: Soft, non-tender, non-distended EXTREMITIES:  No edema; No deformity  ASSESSMENT AND PLAN: .   Assessment and Plan    Palpitations and Presyncope Episodes of lightheadedness, weakness, and palpitations with a sensation of heaviness on the chest. No loss of consciousness. Symptoms could be multifactorial related to medication use and hyperglycemia. EKG normal. -Order 2-week heart monitor to record heart rhythm. -Order echocardiogram. -Order Lexi scan nuclear medicine stress test. -Advise patient to refrain from driving until heart condition is confirmed to be stable. -check A1c and TSH -discuss with PCP adjusting xanax and gabapentin   Diabetes Mellitus Recent HbA1c of 9.8 indicating poor glycemic control. Hyperglycemia could be contributing to symptoms. -Recheck HbA1c today.  Hypothyroidism Could potentially contribute to symptoms. -Recheck TSH today.  Hyperlipidemia LDL 89, controlled on Crestor 5mg  daily. -Continue Crestor 5mg  daily.  Hypertension Blood pressure slightly elevated in the 150s. No evidence of orthostatic hypotension. -Continue current regimen and reassess in 3 months.          Informed Consent   Shared Decision Making/Informed Consent The risks [chest pain, shortness of breath, cardiac arrhythmias, dizziness, blood pressure fluctuations, myocardial infarction, stroke/transient ischemic attack, nausea,  vomiting, allergic reaction, radiation exposure, metallic taste sensation and life-threatening complications (estimated to be 1 in 10,000)], benefits (risk stratification, diagnosing coronary artery disease, treatment guidance) and alternatives of a nuclear stress test were discussed in detail with [name] and she agrees to proceed.      Follow-up: Return in about 3 months (around 01/02/2024).  Signed, Lenna Gilford. Flora Lipps, MD, [facility]  [facility]  [address], [address] [place], [place] [address] [phone]  11:09 AM

## 2023-10-02 ENCOUNTER — Encounter: Payer: Self-pay | Admitting: Cardiovascular Disease

## 2023-10-02 ENCOUNTER — Ambulatory Visit: Payer: 59 | Attending: Cardiovascular Disease | Admitting: Cardiovascular Disease

## 2023-10-02 ENCOUNTER — Ambulatory Visit (INDEPENDENT_AMBULATORY_CARE_PROVIDER_SITE_OTHER): Payer: 59

## 2023-10-02 VITALS — BP 143/82 | HR 83 | Ht 61.0 in | Wt 155.6 lb

## 2023-10-02 DIAGNOSIS — R7303 Prediabetes: Secondary | ICD-10-CM | POA: Diagnosis not present

## 2023-10-02 DIAGNOSIS — R002 Palpitations: Secondary | ICD-10-CM | POA: Diagnosis not present

## 2023-10-02 DIAGNOSIS — R0602 Shortness of breath: Secondary | ICD-10-CM

## 2023-10-02 DIAGNOSIS — R079 Chest pain, unspecified: Secondary | ICD-10-CM | POA: Diagnosis not present

## 2023-10-02 DIAGNOSIS — R42 Dizziness and giddiness: Secondary | ICD-10-CM | POA: Diagnosis not present

## 2023-10-02 DIAGNOSIS — R55 Syncope and collapse: Secondary | ICD-10-CM

## 2023-10-02 NOTE — Patient Instructions (Addendum)
Medication Instructions:  No changes    *If you need a refill on your cardiac medications before your next appointment, please call your pharmacy*   Lab Work: TSH Hemoglobin A1c    If you have labs (blood work) drawn today and your tests are completely normal, you will receive your results only by: MyChart Message (if you have MyChart) OR A paper copy in the mail If you have any lab test that is abnormal or we need to change your treatment, we will call you to review the results.   Testing/Procedures: Echo will be scheduled at [address].  Your physician has requested that you have an echocardiogram. Echocardiography is a painless test that uses sound waves to create images of your heart. It provides your doctor with information about the size and shape of your heart and how well your heart's chambers and valves are working. This procedure takes approximately one hour. There are no restrictions for this procedure. Please do NOT wear cologne, perfume, aftershave, or lotions (deodorant is allowed). Please arrive 15 minutes prior to your appointment time.     Dr. Scharlene Gloss has ordered a Lexiscan Myocardial Perfusion Imaging Study.  Please arrive 15 minutes prior to your appointment time for registration and insurance purposes.   The test will take approximately 3 to 4 hours to complete; you may bring reading material.  If someone comes with you to your appointment, they will need to remain in the main lobby due to limited space in the testing area. **If you are pregnant or breastfeeding, please notify the nuclear lab prior to your appointment**   How to prepare for your Myocardial Perfusion Test: Do not eat or drink 3 hours prior to your test, except you may have water. Do not consume products containing caffeine (regular or decaffeinated) 12 hours prior to your test. (ex: coffee, chocolate, sodas, tea). Do wear comfortable clothes (no dresses or overalls) and walking  shoes, tennis shoes preferred (No heels or open toe shoes are allowed). Do NOT wear cologne, perfume, aftershave, or lotions (deodorant is allowed). If you use an inhaler, use it the AM of your test and bring it with you.  If you use a nebulizer, use it the AM of your test.  If these instructions are not followed, your test will have to be rescheduled.   ZIO XT- Long Term Monitor Instructions  Your physician has requested you wear a ZIO patch monitor for 14 days.  This is a single patch monitor. [employer] supplies one patch monitor per enrollment. Additional stickers are not available. Please do not apply patch if you will be having a Nuclear Stress Test,  Echocardiogram, Cardiac CT, MRI, or Chest Xray during the period you would be wearing the  monitor. The patch cannot be worn during these tests. You cannot remove and re-apply the  ZIO XT patch monitor.  Your ZIO patch monitor will be mailed 3 day USPS to your address on file. It may take 3-5 days  to receive your monitor after you have been enrolled.  Once you have received your monitor, please review the enclosed instructions. Your monitor  has already been registered assigning a specific monitor serial # to you.  Billing and Patient Assistance Program Information  We have supplied [employer] with any of your insurance information on file for billing purposes. [employer] offers a sliding scale Patient Assistance Program for patients that do not have  insurance, or whose insurance does not completely cover the cost  of the ZIO monitor.  You must apply for the Patient Assistance Program to qualify for this discounted rate.  To apply, please call [employer] at [phone], select option 4, select option 2, ask to apply for  Patient Assistance Program. Meredeth Ide will ask your household income, and how many people  are in your household. They will quote your out-of-pocket cost based on that information.  [employer] will also be able to set up a 86-month,  interest-free payment plan if needed.  Applying the monitor   Shave hair from upper left chest.  Hold abrader disc by orange tab. Rub abrader in 40 strokes over the upper left chest as  indicated in your monitor instructions.  Clean area with 4 enclosed alcohol pads. Let dry.  Apply patch as indicated in monitor instructions. Patch will be placed under collarbone on left  side of chest with arrow pointing upward.  Rub patch adhesive wings for 2 minutes. Remove white label marked "1". Remove the white  label marked "2". Rub patch adhesive wings for 2 additional minutes.  While looking in a mirror, press and release button in center of patch. A small green light will  flash 3-4 times. This will be your only indicator that the monitor has been turned on.  Do not shower for the first 24 hours. You may shower after the first 24 hours.  Press the button if you feel a symptom. You will hear a small click. Record Date, Time and  Symptom in the Patient Logbook.  When you are ready to remove the patch, follow instructions on the last 2 pages of Patient  Logbook. Stick patch monitor onto the last page of Patient Logbook.  Place Patient Logbook in the blue and white box. Use locking tab on box and tape box closed  securely. The blue and white box has prepaid postage on it. Please place it in the mailbox as  soon as possible. Your physician should have your test results approximately 7 days after the  monitor has been mailed back to [facility].  Call [facility] Customer Care at [phone] if you have questions regarding  your ZIO XT patch monitor. Call them immediately if you see an orange light blinking on your  monitor.  If your monitor falls off in less than 4 days, contact our Monitor department at [phone].  If your monitor becomes loose or falls off after 4 days call [employer] at [phone] for  suggestions on securing your monitor    Follow-Up: At [facility], you and  your health needs are our priority.  As part of our continuing mission to provide you with exceptional heart care, we have created designated Provider Care Teams.  These Care Teams include your primary Cardiologist (physician) and Advanced Practice Providers (APPs -  Physician Assistants and Nurse Practitioners) who all work together to provide you with the care you need, when you need it.  We recommend signing up for the patient portal called "MyChart".  Sign up information is provided on this After Visit Summary.  MyChart is used to connect with patients for Virtual Visits (Telemedicine).  Patients are able to view lab/test results, encounter notes, upcoming appointments, etc.  Non-urgent messages can be sent to your provider as well.   To learn more about what you can do with MyChart, go to [email or web address].    Your next appointment:   3 month(s)  The format for your next appointment:   In Person  Provider:   Reatha Harps, MD  Other Instructions

## 2023-10-02 NOTE — Progress Notes (Unsigned)
Enrolled patient for a 14 day Zio XT  monitor to be mailed to patients home  °

## 2023-10-02 NOTE — Addendum Note (Signed)
Addended by: Sande Rives on: 10/02/2023 03:13 PM   Modules accepted: Orders

## 2023-10-03 LAB — HEMOGLOBIN A1C
Est. average glucose Bld gHb Est-mCnc: 189 mg/dL
Hgb A1c MFr Bld: 8.2 % — ABNORMAL HIGH (ref 4.8–5.6)

## 2023-10-03 LAB — TSH: TSH: 2.17 u[IU]/mL (ref 0.450–4.500)

## 2023-10-05 ENCOUNTER — Telehealth: Payer: Self-pay | Admitting: Cardiovascular Disease

## 2023-10-05 NOTE — Telephone Encounter (Signed)
Patient is wanting to know what meds she can and can't take prior to her Lexiscan on 11/11. Please advise.

## 2023-10-06 NOTE — Telephone Encounter (Signed)
Pt was calling to f/u on her getting a callback regarding her taking her medications before or after the Lexiscan since she's a diabetic. Please advise

## 2023-10-07 ENCOUNTER — Other Ambulatory Visit: Payer: Self-pay

## 2023-10-07 ENCOUNTER — Telehealth: Payer: Self-pay | Admitting: Internal Medicine

## 2023-10-07 DIAGNOSIS — E1165 Type 2 diabetes mellitus with hyperglycemia: Secondary | ICD-10-CM

## 2023-10-07 MED ORDER — METFORMIN HCL ER 500 MG PO TB24: 500.0000 mg | ORAL_TABLET | Freq: Two times a day (BID) | ORAL | Status: DC

## 2023-10-07 NOTE — Telephone Encounter (Signed)
Metformin refill request complete, patient made aware via phone call

## 2023-10-07 NOTE — Telephone Encounter (Signed)
Patient advising that she need a refill on her Metformin. Please call when done. [facility] on [address]

## 2023-10-07 NOTE — Telephone Encounter (Signed)
Called and spoke to patient  Patient states:   -has concerns about instruction for Lexiscan on 11/11  -would like to know what medications she can take & if she can eat  Reviewed instructions with patient per below Confirmed with patient she is not taking any insulin medications  Patient plans to take medications with food at 6:30am  Patient has no further questions or concerns at this time   Dr. Scharlene Gloss has ordered a Lexiscan Myocardial Perfusion Imaging Study.   Please arrive 15 minutes prior to your appointment time for registration and insurance purposes.   The test will take approximately 3 to 4 hours to complete; you may bring reading material.  If someone comes with you to your appointment, they will need to remain in the main lobby due to limited space in the testing area. **If you are pregnant or breastfeeding, please notify the nuclear lab prior to your appointment**   How to prepare for your Myocardial Perfusion Test: Do not eat or drink 3 hours prior to your test, except you may have water. Do not consume products containing caffeine (regular or decaffeinated) 12 hours prior to your test. (ex: coffee, chocolate, sodas, tea). Do wear comfortable clothes (no dresses or overalls) and walking shoes, tennis shoes preferred (No heels or open toe shoes are allowed). Do NOT wear cologne, perfume, aftershave, or lotions (deodorant is allowed). If you use an inhaler, use it the AM of your test and bring it with you.  If you use a nebulizer, use it the AM of your test.  If these instructions are not followed, your test will have to be rescheduled.

## 2023-10-07 NOTE — Telephone Encounter (Signed)
Attempted to call patient, no answer left message requesting a call back.

## 2023-10-08 ENCOUNTER — Telehealth (HOSPITAL_COMMUNITY): Payer: Self-pay | Admitting: *Deleted

## 2023-10-08 NOTE — Telephone Encounter (Signed)
Patient given detailed instructions per Myocardial Perfusion Study Information Sheet for the test on 10/12/2023 at 10:15. Patient notified to arrive 15 minutes early and that it is imperative to arrive on time for appointment to keep from having the test rescheduled.  If you need to cancel or reschedule your appointment, please call the office within 24 hours of your appointment. . Patient verbalized understanding.[name]

## 2023-10-12 ENCOUNTER — Ambulatory Visit (HOSPITAL_COMMUNITY): Payer: 59 | Attending: Cardiovascular Disease

## 2023-10-12 DIAGNOSIS — R002 Palpitations: Secondary | ICD-10-CM | POA: Diagnosis not present

## 2023-10-12 DIAGNOSIS — R0602 Shortness of breath: Secondary | ICD-10-CM | POA: Diagnosis not present

## 2023-10-12 LAB — MYOCARDIAL PERFUSION IMAGING
LV dias vol: 58 mL (ref 46–106)
LV sys vol: 27 mL
Nuc Stress EF: 53 %
Peak HR: 97 {beats}/min
Rest HR: 68 {beats}/min
Rest Nuclear Isotope Dose: 9 mCi
SDS: 2
SRS: 1
SSS: 3
ST Depression (mm): 0 mm
Stress Nuclear Isotope Dose: 27.4 mCi
TID: 1.03

## 2023-10-12 MED ORDER — REGADENOSON 0.4 MG/5ML IV SOLN
0.4000 mg | Freq: Once | INTRAVENOUS | Status: AC
  Administered 2023-10-12: 0.4 mg via INTRAVENOUS

## 2023-10-12 MED ORDER — TECHNETIUM TC 99M TETROFOSMIN IV KIT
27.4000 | PACK | Freq: Once | INTRAVENOUS | Status: AC | PRN
Start: 2023-10-12 — End: 2023-10-12
  Administered 2023-10-12: 27.4 via INTRAVENOUS

## 2023-10-12 MED ORDER — TECHNETIUM TC 99M TETROFOSMIN IV KIT
9.0000 | PACK | Freq: Once | INTRAVENOUS | Status: AC | PRN
  Administered 2023-10-12: 9 via INTRAVENOUS

## 2023-10-14 ENCOUNTER — Ambulatory Visit (INDEPENDENT_AMBULATORY_CARE_PROVIDER_SITE_OTHER): Payer: 59 | Admitting: Internal Medicine

## 2023-10-14 ENCOUNTER — Encounter: Payer: Self-pay | Admitting: Internal Medicine

## 2023-10-14 ENCOUNTER — Telehealth: Payer: Self-pay

## 2023-10-14 VITALS — BP 140/76 | HR 107 | Ht 62.0 in | Wt 155.8 lb

## 2023-10-14 DIAGNOSIS — E119 Type 2 diabetes mellitus without complications: Secondary | ICD-10-CM

## 2023-10-14 DIAGNOSIS — Z7984 Long term (current) use of oral hypoglycemic drugs: Secondary | ICD-10-CM

## 2023-10-14 DIAGNOSIS — E042 Nontoxic multinodular goiter: Secondary | ICD-10-CM

## 2023-10-14 DIAGNOSIS — E1165 Type 2 diabetes mellitus with hyperglycemia: Secondary | ICD-10-CM

## 2023-10-14 DIAGNOSIS — E039 Hypothyroidism, unspecified: Secondary | ICD-10-CM

## 2023-10-14 MED ORDER — REPAGLINIDE 1 MG PO TABS: 1.0000 mg | ORAL_TABLET | Freq: Three times a day (TID) | ORAL | 3 refills | Status: DC

## 2023-10-14 MED ORDER — METFORMIN HCL ER 500 MG PO TB24: 500.0000 mg | ORAL_TABLET | Freq: Two times a day (BID) | ORAL | 3 refills | Status: DC

## 2023-10-14 MED ORDER — LEVOTHYROXINE SODIUM 50 MCG PO TABS: 50.0000 ug | ORAL_TABLET | Freq: Every day | ORAL | 3 refills | Status: DC

## 2023-10-14 NOTE — Telephone Encounter (Signed)
Requested Prescriptions   Signed Prescriptions Disp Refills   metFORMIN (GLUCOPHAGE-XR) 500 MG 24 hr tablet 180 tablet 3    Sig: Take 1 tablet (500 mg total) by mouth in the morning and at bedtime.    Authorizing Provider: Carlus Pavlov    Ordering User: Pollie Meyer

## 2023-10-14 NOTE — Progress Notes (Addendum)
Patient ID: [name], female   DOB: [birth year], 82 y.o.   MRN: [id number]  HPI: [name] is a 82 y.o.-year-old female, returning for follow-up for DM2, dx in 1993, non-insulin-dependent, uncontrolled, with complications (PN). Pt. previously saw Dr. Everardo All, but last visit with me 4 months ago.  She is here with her sister who offers part of the history.  Interim history: No increased urination, blurry vision, nausea, chest pain.  She is very stressed as her sister and her nephew, which are her only family, both have stage IV cancer. She is undergoing cardiac investigation for weakness and dizziness.  She just had a stress test 2 days ago and is currently on a ZIO monitor.  She has a 2D echo coming up.  She has been quite stressed.  Sugars are higher.  Reviewed HbA1c: Lab Results  Component Value Date   HGBA1C 8.2 (H) 10/02/2023   HGBA1C 6.8 06/09/2023   HGBA1C 9.8 (A) 01/20/2023   HGBA1C 6.2 (A) 08/12/2022   HGBA1C 7.0 (A) 04/07/2022   HGBA1C 7.9 (A) 02/18/2022   HGBA1C 9.6 (A) 12/17/2021   HGBA1C 8.3 (A) 10/11/2021   HGBA1C 7.0 (A) 04/25/2021   HGBA1C 7.0 (A) 01/24/2021   Pt is on a regimen of: - Metformin ER 500 mg 2x daily - Prandin 2 mg 3x a day >> stopped 08/2022 >> restarted 01/2023 >> 1 mg 3x a day >> stopped 06/2023 - Acarbose 25 mg 3x a day - Rybelsus 14 mg in am When you have a steroid injection, use: - Humalog 5-6 units before meals after you get the steroid injections.  She tried bromocriptine >> glycemia. She tried Invokana >> dizziness. She tried [place] >> hypotension. She tried pioglitazone >> edema.  Pt checks her sugars 0-1x a day: - am: 95-140s >> 97-171 >> 103-158, 163 >> 135-230, 286 - 2h after b'fast: n/c >> 158, 228 - before lunch: 119-178, 273 >> 116-163 >> 116-163 >> n/c - 2h after lunch: n/c - before dinner: 143-167, 200 >> 102-177, 201-263 (steroid) >> 152-275, 309 - 2h after dinner: n/c - bedtime: n/c>> 116-184>> 127-180 >> 137-265 -  nighttime: n/c Lowest sugar was ? (Episodes of weak and sweating, no tremors) >> 97 >> 135; she has hypoglycemia awareness at 70.  Highest sugar was 160 >> 273 >> 309  Glucometer: Accu Check guide  Pt's meals are: - Breakfast: egg and toast; sometimes oatmeal, sometimes grits - Lunch: pimento or bologna cheese sandwich - Dinner: TV dinner - Snacks: sugar free jello, mints  - no CKD, last BUN/creatinine:  09/17/2023: 23/0.68, GFR 75, ACR 1.47 Lab Results  Component Value Date   BUN 12 04/21/2022   BUN 10 04/19/2022   CREATININE 0.68 04/21/2022   CREATININE 0.62 04/19/2022   Lab Results  Component Value Date   MICRALBCREAT 1.6 07/19/2014   MICRALBCREAT 1.6 05/18/2013  She is is on Benicar 5 mg daily.  - + HL; last set of lipids: 09/17/2023: 183/202/61/89 No results found for: "CHOL", "HDL", "LDLCALC", "LDLDIRECT", "TRIG", "CHOLHDL" She is on Crestor 5 mg daily.  - last eye exam was several years ago. No DR.   - no numbness and tingling in her feet.  On gabapentin 300 mg 3 times a day.  Last foot exam 08/12/2022.  Hypothyroidism:  Pt is on levothyroxine 50 mcg daily, taken: - in am - fasting - immediately from b'fast -+ calcium either in am or in the pm - no iron - no multivitamins - no PPIs, +  Pepcid in am >> pm - not on Biotin  Reviewed TSH levels: Lab Results  Component Value Date   TSH 2.170 10/02/2023   TSH 1.59 09/29/2022   TSH 4.34 12/17/2021   TSH 2.21 04/25/2021  08/18/2022: TSH 5.43 (0.34-4.5)  Thyroid nodules:  Reviewed the report of her Thyroid U/S (09/06/2021): Parenchymal Echotexture: Mildly heterogeneous  Isthmus: 0.3 cm  Right lobe: 3.0 x 1.3 x 1.5 cm  Left lobe: 5.3 x 2.3 x 2.3 cm  _________________________________________________________   Nodule # 1:  Location: Right; inferior  Maximum size: 1.2 cm; Other 2 dimensions: 0.8 x 0.9 cm  Composition: solid/almost completely solid (2)  Echogenicity: isoechoic (1)  Given size (<1.4 cm) and  appearance, this nodule does NOT meet TI-RADS criteria for biopsy or dedicated follow-up.  _________________________________________________________   Nodule # 2:  Location: Left; mid  Maximum size: 2.1 cm; Other 2 dimensions: 1.1 x 1.5 cm  Composition: solid/almost completely solid (2)  Echogenicity: hypoechoic (2)  **Given size (>/= 1.5 cm) and appearance, fine needle aspiration of this moderately suspicious nodule should be considered based on TI-RADS criteria.  _________________________________________________________   Nodule # 3:  Location: Left; inferior  Maximum size: 2.5 cm; Other 2 dimensions: 2.0 x 2.4 cm  Composition: solid/almost completely solid (2)  Echogenicity: isoechoic (1) **Given size (>/= 2.5 cm) and appearance, fine needle aspiration of this mildly suspicious nodule should be considered based on TI-RADS criteria.  _________________________________________________________   IMPRESSION: 1. Nodule 2 (TI-RADS 4) located in the mid left thyroid lobe meets criteria for FNA. 2. Nodule 3 (TI-RADS 3) located in the inferior left thyroid lobe meets criteria for FNA.  FNA of the 2 nodules (10/02/2021): Clinical History: Left mid 2.1cm; Other 2 dimensions: 1.1 x 1.5cm, Solid  / almost completely solid, Hypoechoic, TI-RADS total points 4  Specimen Submitted:  A. THYROID, LEFT MID, FINE NEEDLE ASPIRATION:  FINAL MICROSCOPIC DIAGNOSIS:  - Follicular lesion of undetermined significance (Bethesda category III)  SPECIMEN ADEQUACY:  Satisfactory for evaluation   Afirma: benign  Clinical History: Left inferior 2.5cm; Other 2 dimensions:2.0 x 2.4cm,  Solid / almost completely solid, Isoechoic, TI-RADS total 3  Specimen Submitted:  A. THYROID, LEFT INFERIOR, FINE NEEDLE ASPIRATION:  FINAL MICROSCOPIC DIAGNOSIS:  - Atypia of undetermined significance (Bethesda category III)  SPECIMEN ADEQUACY:  Satisfactory for evaluation   Afirma: benign  She also has a history of  HTN, depression/anxiety, osteoarthritis.  ROS: + see HPI  Past Medical History:  Diagnosis Date   Anxiety    Arthritis    Cancer (HCC)    Depression    Diabetes mellitus without complication (HCC)    Hypertension    Hypothyroidism    Past Surgical History:  Procedure Laterality Date   ABDOMINAL HYSTERECTOMY     BACK SURGERY  12/01/2004   EXCISION MASS LOWER EXTREMETIES Left 07/11/2019   Procedure: EXCISION MASS foot;  Surgeon: Beverely Low, MD;  Location: [facility];  Service: Orthopedics;  Laterality: Left;   GALLBLADDER SURGERY  12/02/2003   HERNIA REPAIR     KNEE SURGERY  2001/2005   REVERSE SHOULDER ARTHROPLASTY Left 04/18/2022   Procedure: REVERSE SHOULDER ARTHROPLASTY;  Surgeon: Beverely Low, MD;  Location: [facility];  Service: Orthopedics;  Laterality: Left;  with ISB   Social History   Socioeconomic History   Marital status: Single    Spouse name: Not on file   Number of children: 4   Years of education: Not on file   Highest education  level: Not on file  Occupational History   Occupation: Retired  Tobacco Use   Smoking status: Former    Current packs/day: 0.00    Average packs/day: 0.1 packs/day for 3.0 years (0.2 ttl pk-yrs)    Types: Cigarettes    Start date: 24    Quit date: 1980    Years since quitting: 44.8   Smokeless tobacco: Never  Vaping Use   Vaping status: Never Used  Substance and Sexual Activity   Alcohol use: Never   Drug use: Never   Sexual activity: Not Currently  Other Topics Concern   Not on file  Social History Narrative   ** Merged History Encounter **       Social Determinants of Health   Financial Resource Strain: Not on file  Food Insecurity: Not on file  Transportation Needs: Not on file  Physical Activity: Not on file  Stress: Not on file  Social Connections: Not on file  Intimate Partner Violence: Not on file   Current Outpatient Medications on File Prior to Visit  Medication Sig Dispense Refill    acarbose (PRECOSE) 25 MG tablet TAKE 1 TABLET BY MOUTH 3 TIMES A DAY WITH MEALS 270 tablet 3   Accu-Chek FastClix Lancets MISC 3 each by Does not apply route daily. E11.9 204 each 3   acetaminophen (TYLENOL) 325 MG tablet Take 500 mg by mouth every 6 (six) hours as needed for pain.     alprazolam (XANAX) 2 MG tablet Take 2 mg by mouth 2 (two) times daily.     Calcium Citrate-Vitamin D (CALCIUM CITRATE + D PO) Take 1 tablet by mouth daily.     Cholecalciferol 50 MCG (2000 UT) TABS Take 2 tablets by mouth daily at 6 (six) AM.     CRESTOR 5 MG tablet Take 5 mg by mouth daily.     cyanocobalamin (VITAMIN B12) 500 MCG tablet Take 500 mcg by mouth daily.     diclofenac sodium (VOLTAREN) 1 % GEL Apply 4 g topically 4 (four) times daily as needed (LEG PAIN).     famotidine (PEPCID) 20 MG tablet Take 20 mg by mouth daily.     gabapentin (NEURONTIN) 300 MG capsule Take 300 mg by mouth 3 (three) times daily.     glucose blood (ACCU-CHEK GUIDE) test strip USE TO TEST BLOOD GLUCOSE 3 TIMES DAILY, AS INSTRUCTED. DX CODE: E11.9 300 strip 5   insulin lispro (HUMALOG KWIKPEN) 100 UNIT/ML KwikPen Inject 5-6 Units into the skin 3 (three) times daily. (Patient not taking: Reported on 10/02/2023) 15 mL 11   Insulin Pen Needle 32G X 4 MM MISC Use 3x a day before meals (Patient not taking: Reported on 10/02/2023) 100 each 3   levothyroxine (SYNTHROID) 50 MCG tablet TAKE 1 TABLET(50 MCG) BY MOUTH DAILY 90 tablet 0   metFORMIN (GLUCOPHAGE-XR) 500 MG 24 hr tablet Take 1 tablet (500 mg total) by mouth in the morning and at bedtime.     methocarbamol (ROBAXIN) 500 MG tablet Take 1 tablet (500 mg total) by mouth every 8 (eight) hours as needed for muscle spasms. 60 tablet 1   olmesartan (BENICAR) 5 MG tablet Take 5 mg by mouth daily.     oxyCODONE-acetaminophen (PERCOCET/ROXICET) 5-325 MG tablet Take 1-2 tablets by mouth every 4 (four) hours as needed for severe pain or moderate pain. (Patient not taking: Reported on  10/02/2023) 40 tablet 0   promethazine (PHENERGAN) 25 MG tablet Take 25 mg by mouth every 6 (six) hours  as needed for nausea or vomiting. (Patient not taking: Reported on 10/02/2023)     repaglinide (PRANDIN) 1 MG tablet Take 1 tablet (1 mg total) by mouth 3 (three) times daily before meals. (Patient not taking: Reported on 10/02/2023) 270 tablet 3   Semaglutide (RYBELSUS) 14 MG TABS Take 1 tablet (14 mg total) by mouth daily. 90 tablet 3   traMADol (ULTRAM) 50 MG tablet Take 50 mg by mouth every 6 (six) hours as needed.     Vitamin D, Ergocalciferol, (DRISDOL) 50000 UNITS CAPS capsule Take 50,000 Units by mouth every 7 (seven) days. (Patient not taking: Reported on 10/02/2023)     No current facility-administered medications on file prior to visit.   Allergies  Allergen Reactions   Aspirin Other (See Comments)    Pt states her PCP told her not to take aspirin but she can not remember exactly why.   Codeine Nausea And Vomiting   Hydrocodone Nausea And Vomiting   Latex Itching   Morphine And Codeine Nausea And Vomiting   Darvon [Propoxyphene] Nausea And Vomiting   Family History  Problem Relation Age of Onset   Arthritis Mother    Hyperlipidemia Mother    Heart disease Mother    Hypertension Mother    Diabetes Mother    Arthritis Father    Hyperlipidemia Father    Heart disease Father    Hypertension Father    Diabetes Father    Alcohol abuse Sister    Hypertension Sister    Cancer Sister    PE: BP (!) 140/76 Comment: Currently Undergoing heart test, BP has been running high  Pulse (!) 107   Ht 5\' 2"  (1.575 m)   Wt 155 lb 12.8 oz (70.7 kg)   SpO2 98%   BMI 28.50 kg/m  Wt Readings from Last 3 Encounters:  10/14/23 155 lb 12.8 oz (70.7 kg)  10/12/23 155 lb (70.3 kg)  10/02/23 155 lb 9.6 oz (70.6 kg)   Constitutional: overweight, in NAD Eyes: no exophthalmos ENT: no thyromegaly, no cervical lymphadenopathy Cardiovascular: tachycardia, RR, No MRG Respiratory: CTA  B Musculoskeletal: no deformities Skin:  no rashes Neurological: + Head and hands tremors tremor with outstretched hands  ASSESSMENT: 1. DM2, non-insulin-dependent, uncontrolled, with complications - PN  2.  Thyroid nodules  3.  Hypothyroidism  PLAN:  1. Patient with longstanding, uncontrolled type 2 diabetes, on oral antidiabetic regimen, with improvement in control at last visit, when an HbA1c returned 6.8%.  At that time, sugars were at or slightly above target and she described that she was feeling hypoglycemic when the blood sugars were 102-103 and she also felt that Prandin was dropping her sugars too low.  Therefore, I advised her to stop Prandin.  Since sugars were higher in the 200s after steroid injections, I advised her to take Humalog before meals after an injection. -However, 2 weeks ago, she had another HbA1c which increased to 8.2% -At today's visit, sugars are variable but she does have more hyperglycemic values, occasionally in the 200s.  This could be related to the anxiety related to her cardiac investigation (she appears quite anxious today), but also, due to stopping repaglinide and possibly also using expired metformin.  At today's visit we discussed about restarting repaglinide and we sent a new prescription for metformin to her pharmacy.  Will continue the rest of the regimen. - I suggested to:  Patient Instructions  Please continue: - Metformin ER 500 mg 2x daily - Acarbose 25 mg 3x a  day - Rybelsus 14 mg in am  Please restart: - Repaglinide 1 mg 3x a day before meals  When you have a steroid injection, use: - Humalog 5-6 units before meals after you get the steroid injections.   Please continue levothyroxine 50 mcg daily   Take the thyroid hormone every day, with water, at least 30 minutes before breakfast, separated by at least 4 hours from: - acid reflux medications - calcium - iron - multivitamins   Please return in 3-4 month with your sugar log.    - advised to check sugars at different times of the day - 1x a day, rotating check times - advised for yearly eye exams >> she is UTD - return to clinic in 3-4 months  2.  Thyroid nodules -Patient with history of 3 thyroid nodules, without concerning features, but with 2 dominant nodules larger than 2 cm on the thyroid ultrasound from 2022 -The 2 dominant larger nodules were biopsied in 10/2021 with indeterminant results, however, Afirma molecular marker returned benign for both nodules -No neck compression symptoms. -W will recheck another ultrasound now.  If stable, no further investigation needed.  3.  Hypothyroidism - latest thyroid labs reviewed with pt. >> normal 2 weeks ago: Lab Results  Component Value Date   TSH 2.170 10/02/2023  - she continues on LT4 50 mcg daily - pt feels good on this dose. - we discussed about taking the thyroid hormone every day, with water, >30 minutes before breakfast, separated by >4 hours from acid reflux medications, calcium, iron, multivitamins. Pt. is taking it correctly.  Thyroid U/S (10/21/2023): Parenchymal Echotexture: Mildly heterogenous  Isthmus: 0.3 cm  Right lobe: 2.9 x 1.4 x 1.1 cm  Left lobe: 4.1 x 1.3 x 1.2 cm  _________________________________________________________   Estimated total number of nodules >/= 1 cm: 3 _________________________________________________________   Nodule # 1: Small isoechoic solid nodule in the right lower gland remains stable at 1.2 cm. Given size (<1.4 cm) and appearance, this nodule does NOT meet TI-RADS criteria for biopsy or dedicated follow-up.   Nodules # 2 and # 3: Small subcentimeter nodules in the left upper and mid gland. No further follow-up.   Nodule # 4: Previously biopsied ill-defined nodule in the left mid gland is unchanged at 2.0 x 1.6 x 0.7 cm.   Nodule # 5: Previously biopsied nodule in the left inferior gland measures slightly smaller at 2.2 x 2.2 x 1.8 cm.    IMPRESSION: 1. Decreasing size of previously biopsied nodule # 5 in the left inferior gland. Involution over time is consistent with benignity. Recommend correlation with prior biopsy results. 2. No interval change in the size or appearance of the previously biopsied nodule # 4 in the left mid gland. 3. Additional small nodules are again identified. As before, these do not meet criteria to recommend biopsy or for imaging surveillance. No follow-up imaging recommended.  Carlus Pavlov, MD PhD [facility] Endocrinology

## 2023-10-14 NOTE — Patient Instructions (Addendum)
Please continue: - Metformin ER 500 mg 2x daily - Acarbose 25 mg 3x a day - Rybelsus 14 mg in am  Please restart: - Repaglinide 1 mg 3x a day before meals  When you have a steroid injection, use: - Humalog 5-6 units before meals after you get the steroid injections.   Please continue levothyroxine 50 mcg daily   Take the thyroid hormone every day, with water, at least 30 minutes before breakfast, separated by at least 4 hours from: - acid reflux medications - calcium - iron - multivitamins   Please return in 3-4 month with your sugar log.

## 2023-10-21 ENCOUNTER — Ambulatory Visit
Admission: RE | Admit: 2023-10-21 | Discharge: 2023-10-21 | Disposition: A | Discharge status: Home / Self Care | Payer: 59 | Source: Ambulatory Visit | Attending: Internal Medicine | Admitting: Internal Medicine

## 2023-10-21 DIAGNOSIS — E041 Nontoxic single thyroid nodule: Secondary | ICD-10-CM | POA: Diagnosis not present

## 2023-10-21 DIAGNOSIS — E042 Nontoxic multinodular goiter: Secondary | ICD-10-CM

## 2023-11-05 ENCOUNTER — Telehealth: Payer: Self-pay | Admitting: Cardiovascular Disease

## 2023-11-05 ENCOUNTER — Ambulatory Visit: Payer: 59 | Admitting: Cardiovascular Disease

## 2023-11-05 ENCOUNTER — Ambulatory Visit (HOSPITAL_COMMUNITY): Payer: 59

## 2023-11-05 ENCOUNTER — Ambulatory Visit (HOSPITAL_COMMUNITY): Payer: 59 | Attending: Cardiovascular Disease

## 2023-11-05 DIAGNOSIS — R55 Syncope and collapse: Secondary | ICD-10-CM | POA: Diagnosis not present

## 2023-11-05 DIAGNOSIS — R079 Chest pain, unspecified: Secondary | ICD-10-CM | POA: Diagnosis not present

## 2023-11-05 DIAGNOSIS — R42 Dizziness and giddiness: Secondary | ICD-10-CM | POA: Insufficient documentation

## 2023-11-05 DIAGNOSIS — R002 Palpitations: Secondary | ICD-10-CM | POA: Diagnosis not present

## 2023-11-05 DIAGNOSIS — R0602 Shortness of breath: Secondary | ICD-10-CM | POA: Diagnosis not present

## 2023-11-05 LAB — ECHOCARDIOGRAM COMPLETE
Area-P 1/2: 3.7 cm2
S' Lateral: 2.3 cm

## 2023-11-05 NOTE — Telephone Encounter (Signed)
Attempted to call patient with no answer. No voicemail unable to leave message.    Cardiac Monitor Alert  Date of alert:  11/05/2023   Patient Name: [name]  DOB: [birth year]  MRN: [id number]   [facility] Cardiologist: Reatha Harps, MD  [facility] EP:  None    Monitor Information: Long Term Monitor [ZioXT]  Reason:  Palpitations Ordering provider:  O'Neal   Alert Supraventricular Tachycardia - fastest HR:  200 bpm This is the 1st alert for this rhythm.   Next Cardiology Appointment   Date:  01/08/24  Provider:  Dr Flora Lipps  The patient could NOT be reached by telephone today.  11/05/23 at 1315 Arrhythmia, symptoms and history reviewed with Dr Rennis Golden.   Plan:  No changes at this time   Other: Episode of SVT 200 bpm for 60 sec  Kurtis Bushman, RN  11/05/2023 1:21 PM

## 2023-11-05 NOTE — Telephone Encounter (Signed)
Caller [name]) is reported abnormal results.  Ref# [id number].

## 2023-11-10 MED ORDER — METOPROLOL SUCCINATE ER 25 MG PO TB24: 25.0000 mg | ORAL_TABLET | Freq: Every evening | ORAL | 3 refills | Status: DC

## 2023-11-10 NOTE — Telephone Encounter (Signed)
Follow Up:       Patient said she was calling back about her Monitor results

## 2023-11-10 NOTE — Telephone Encounter (Signed)
Jennifer Rives, MD 11/09/2023  9:49 PM EST     Monitor shows atrial fibrillation. Can we start her on metoprolol succinate 25 mg daily and have her see me on Monday 12/16 to discuss next steps. I would  like to discuss anticoagulation with her in person.   [name] T. Flora Lipps, MD, [facility]  [facility] [address] [place], [place] [address] [phone] 9:49 PM   Patient identification verified by 2 forms. Jennifer Rail, RN    Called and spoke to patient  Relayed provider result message  Reviewed Rx instruction/education  Patient scheduled for OV 12/16 at 4:30pm  Patient verbalized understanding, no questions at this time

## 2023-11-15 NOTE — Progress Notes (Unsigned)
Cardiology Office Note:  .   Date:  11/16/2023  ID:  [name], DOB [birth year], MRN [id number] PCP: Tally Joe, MD  [facility] Providers Cardiologist:  Reatha Harps, MD { History of Present Illness: .   [name] is a 82 y.o. female with history of DM, HTN, Hld, pAF who presents for follow-up.    History of Present Illness   The patient, an 82 year old with a history of diabetes, hypertension, hyperlipidemia, and recently diagnosed paroxysmal atrial fibrillation, presents for follow-up. She was recently evaluated for presyncope and found to have paroxysmal Afib and frequent PVCs. Her nuclear medicine stress test and echo were normal. She reports feeling much better since starting metoprolol, with a significant reduction in her dizziness and presyncope. However, she still experiences these symptoms occasionally, about three times a day, particularly when up and walking. She also reports feeling her heart rate speed up and slow down, which she attributes to her atrial fibrillation.  The patient also has a history of diabetes, which she is working on Clinical cytogeneticist. She reports that her blood sugar has been doing well, but she does require steroid shots every two months, which can cause her blood sugar to rise. She has not had to use her insulin pen yet and manages her diabetes with medication.  The patient also has hypertension, which is currently managed with medication. She reports that her blood pressure is well controlled at home, but it was slightly elevated during the visit. She monitors her blood pressure at home and records the readings.  The patient also has hyperlipidemia, which is managed with Crestor. She was previously on a higher dose, but it was reduced due to side effects. Despite this, her cholesterol levels have been good.          Problem List DM -A1c 8.2 HLD -T chol 183, HDL 61, LDL 89, TG 202 HTN Paroxysmal atrial fibrillation  -negative MPI  10/12/2023 -TTE 60-65% 11/05/2023 PVCs  -3.2% burden     ROS: All other ROS reviewed and negative. Pertinent positives noted in the HPI.     Studies Reviewed: Marland Kitchen        TTE 11/05/2023  1. Left ventricular ejection fraction, by estimation, is 60 to 65%. The  left ventricle has normal function. The left ventricle has no regional  wall motion abnormalities. Left ventricular diastolic parameters are  consistent with Grade I diastolic  dysfunction (impaired relaxation).   2. Right ventricular systolic function is normal. The right ventricular  size is normal. Tricuspid regurgitation signal is inadequate for assessing  PA pressure.   3. The mitral valve is degenerative. No evidence of mitral valve  regurgitation. Moderate mitral annular calcification.   4. The aortic valve is grossly normal. Aortic valve regurgitation is not  visualized.   5. The inferior vena cava is normal in size with greater than 50%  respiratory variability, suggesting right atrial pressure of 3 mmHg.  Physical Exam:   VS:  BP (!) 150/81 (BP Location: Right Arm, Patient Position: Sitting, Cuff Size: Normal)   Pulse 75   Ht 5\' 1"  (1.549 m)   Wt 153 lb 6.4 oz (69.6 kg)   SpO2 98%   BMI 28.98 kg/m    Wt Readings from Last 3 Encounters:  11/16/23 153 lb 6.4 oz (69.6 kg)  10/14/23 155 lb 12.8 oz (70.7 kg)  10/12/23 155 lb (70.3 kg)    GEN: Well nourished, well developed in no acute distress NECK: No JVD; No carotid  bruits CARDIAC: RRR, no murmurs, rubs, gallops RESPIRATORY:  Clear to auscultation without rales, wheezing or rhonchi  ABDOMEN: Soft, non-tender, non-distended EXTREMITIES:  No edema; No deformity  ASSESSMENT AND PLAN: .   Assessment and Plan    Paroxysmal Atrial Fibrillation and Rapid SVT Improved symptoms with Metoprolol 25mg  daily, but still experiencing dizziness. Heart rate can reach above 200 bpm. Normal echo and stress tests. -Continue Metoprolol 25mg  daily. -Start Eliquis 5mg  BID for  stroke prevention. -Refer to electrophysiology for further management, potential benefit from ablation.  Hypertension Blood pressure elevated in office, but reported as well-controlled at home. -Continue current regimen. -Patient to monitor blood pressure at home.  Hyperlipidemia On Crestor 5mg  daily. -Increase Crestor to 10mg  daily.  Diabetes Patient is working on Insurance account manager. -Continue current Insurance account manager.  Follow-up Patient has an appointment scheduled in February.              Follow-up: Return in about 4 months (around 03/16/2024).  Time Spent with Patient: I have spent a total of 35 minutes caring for this patient today face to face, ordering and reviewing labs/tests, reviewing prior records/medical history, examining the patient, establishing an assessment and plan, communicating results/findings to the patient/family, and documenting in the medical record.   Signed, Lenna Gilford. Flora Lipps, MD, [facility]  [facility]  [address], [address] [place], [place] [address] [phone]  8:17 PM

## 2023-11-16 ENCOUNTER — Ambulatory Visit: Payer: 59 | Attending: Cardiovascular Disease | Admitting: Cardiovascular Disease

## 2023-11-16 ENCOUNTER — Encounter: Payer: Self-pay | Admitting: Cardiovascular Disease

## 2023-11-16 VITALS — BP 150/81 | HR 75 | Ht 61.0 in | Wt 153.4 lb

## 2023-11-16 DIAGNOSIS — I10 Essential (primary) hypertension: Secondary | ICD-10-CM | POA: Diagnosis not present

## 2023-11-16 DIAGNOSIS — I493 Ventricular premature depolarization: Secondary | ICD-10-CM | POA: Diagnosis not present

## 2023-11-16 DIAGNOSIS — I480 Paroxysmal atrial fibrillation: Secondary | ICD-10-CM | POA: Diagnosis not present

## 2023-11-16 DIAGNOSIS — E782 Mixed hyperlipidemia: Secondary | ICD-10-CM | POA: Diagnosis not present

## 2023-11-16 DIAGNOSIS — I471 Supraventricular tachycardia, unspecified: Secondary | ICD-10-CM

## 2023-11-16 MED ORDER — APIXABAN 5 MG PO TABS: 5.0000 mg | ORAL_TABLET | Freq: Two times a day (BID) | ORAL | 3 refills | Status: DC

## 2023-11-16 MED ORDER — ROSUVASTATIN CALCIUM 5 MG PO TABS: 5.0000 mg | ORAL_TABLET | Freq: Every day | ORAL | 3 refills | Status: DC

## 2023-11-16 MED ORDER — APIXABAN 5 MG PO TABS: 5.0000 mg | ORAL_TABLET | Freq: Two times a day (BID) | ORAL | 0 refills | Status: DC

## 2023-11-16 NOTE — Patient Instructions (Signed)
Medication Instructions:  - START ELIQUIS 5mg  twice a day  - Start CRESTOR 10MG  DAILY    *If you need a refill on your cardiac medications before your next appointment, please call your pharmacy*   Lab Work: NONE    If you have labs (blood work) drawn today and your tests are completely normal, you will receive your results only by: MyChart Message (if you have MyChart) OR A paper copy in the mail If you have any lab test that is abnormal or we need to change your treatment, we will call you to review the results.   Testing/Procedures: NONE    Follow-Up: At [facility], you and your health needs are our priority.  As part of our continuing mission to provide you with exceptional heart care, we have created designated Provider Care Teams.  These Care Teams include your primary Cardiologist (physician) and Advanced Practice Providers (APPs -  Physician Assistants and Nurse Practitioners) who all work together to provide you with the care you need, when you need it.  We recommend signing up for the patient portal called "MyChart".  Sign up information is provided on this After Visit Summary.  MyChart is used to connect with patients for Virtual Visits (Telemedicine).  Patients are able to view lab/test results, encounter notes, upcoming appointments, etc.  Non-urgent messages can be sent to your provider as well.   To learn more about what you can do with MyChart, go to [email or web address].    Your next appointment:   January 07, 2023 at 9:40am  The format for your next appointment:   In Person  Provider:   Reatha Harps, MD    Other Instructions

## 2023-12-07 ENCOUNTER — Other Ambulatory Visit: Payer: Self-pay | Admitting: Internal Medicine

## 2023-12-07 DIAGNOSIS — E119 Type 2 diabetes mellitus without complications: Secondary | ICD-10-CM

## 2023-12-09 ENCOUNTER — Other Ambulatory Visit (HOSPITAL_COMMUNITY): Payer: 59

## 2023-12-21 ENCOUNTER — Other Ambulatory Visit: Payer: Self-pay | Admitting: Internal Medicine

## 2023-12-21 DIAGNOSIS — E119 Type 2 diabetes mellitus without complications: Secondary | ICD-10-CM

## 2023-12-21 NOTE — Telephone Encounter (Signed)
Acarbose refill request complete

## 2024-01-06 NOTE — Progress Notes (Signed)
 Cardiology Office Note:  .   Date:  01/07/2024  ID:  [name], DOB [birth year], MRN [id number] PCP: Seabron Lenis, MD  [facility] Providers Cardiologist:  Darryle ONEIDA Decent, MD Electrophysiologist:  Fonda Kitty, MD { History of Present Illness: .    Chief Complaint  Patient presents with   Follow-up    3 months.   Headache   Fatigue    [name] is a 83 y.o. female with history of DM, Hld, pAF, HTN who presents for follow-up.    History of Present Illness   [name] is an 83 year old female with diabetes, hyperlipidemia, and paroxysmal atrial fibrillation who presents for follow-up.  She experiences daily episodes of dizziness and weakness lasting about five minutes, often accompanied by palpitations. These symptoms have improved since starting metoprolol  but persist. She recalls a past incident during back surgery where her heart rate and blood pressure dropped significantly, leading to prolonged heart medication use.  She reports shortness of breath with minimal exertion, requiring frequent rest, and experiences chest discomfort described as achiness. No significant changes in blood pressure are noted.  She is currently taking metoprolol  succinate 25 mg daily at bedtime and Eliquis  5 mg twice daily. Her blood pressure is stable. She notes recent low blood sugar levels, with her last A1c being 8.2. She plans to discuss medication adjustments with her diabetes doctor in March.  She has recently moved to a new residence and has been busy organizing her new home, which she finds tiring. Her water intake has decreased since moving.  She acknowledges a family history of atrial fibrillation, with several relatives having had the condition.          Problem List DM -A1c 8.2 HLD -T chol 183, HDL 61, LDL 89, TG 202 HTN Paroxysmal atrial fibrillation  -negative MPI 10/12/2023 -TTE 60-65% 11/05/2023 PVCs  -3.2% burden     ROS: All other ROS reviewed and  negative. Pertinent positives noted in the HPI.     Studies Reviewed: SABRA        TTE 11/05/2023  1. Left ventricular ejection fraction, by estimation, is 60 to 65%. The  left ventricle has normal function. The left ventricle has no regional  wall motion abnormalities. Left ventricular diastolic parameters are  consistent with Grade I diastolic  dysfunction (impaired relaxation).   2. Right ventricular systolic function is normal. The right ventricular  size is normal. Tricuspid regurgitation signal is inadequate for assessing  PA pressure.   3. The mitral valve is degenerative. No evidence of mitral valve  regurgitation. Moderate mitral annular calcification.   4. The aortic valve is grossly normal. Aortic valve regurgitation is not  visualized.   5. The inferior vena cava is normal in size with greater than 50%  respiratory variability, suggesting right atrial pressure of 3 mmHg.   NM Stress 10/12/2023   Findings are consistent with no ischemia. The study is low risk.   No ST deviation was noted. The ECG was not diagnostic due to pharmacologic protocol.   LV perfusion is abnormal. Defect 1: There is a large defect with moderate reduction in uptake present in the apical to basal inferior, inferolateral, inferoseptal and apex location(s) that is fixed. There is normal wall motion in the defect area.   Left ventricular function is abnormal. Global function is mildly reduced. There were no regional wall motion abnormalities. Nuclear stress EF: 53%. The left ventricular ejection fraction is mildly decreased (45-54%). End diastolic cavity size  is normal. End systolic cavity size is normal.   Prior study not available for comparison. Physical Exam:   VS:  BP 128/78 (BP Location: Right Arm, Patient Position: Sitting, Cuff Size: Normal)   Pulse 70   Ht 5' 1 (1.549 m)   Wt 154 lb (69.9 kg)   BMI 29.10 kg/m    Wt Readings from Last 3 Encounters:  01/07/24 154 lb (69.9 kg)  11/16/23 153 lb 6.4  oz (69.6 kg)  10/14/23 155 lb 12.8 oz (70.7 kg)    GEN: Well nourished, well developed in no acute distress NECK: No JVD; No carotid bruits CARDIAC: RRR, no murmurs, rubs, gallops RESPIRATORY:  Clear to auscultation without rales, wheezing or rhonchi  ABDOMEN: Soft, non-tender, non-distended EXTREMITIES:  No edema; No deformity  ASSESSMENT AND PLAN: .   Assessment and Plan    Paroxysmal Atrial Fibrillation Daily episodes of rapid heartbeat sensation associated with shortness of breath, fatigue, and chest discomfort. Symptoms improved but not entirely resolved with Metoprolol . Monitor showed SVT and PVCs. Discussed potential ablation with EP specialist. -Start Multaq  400mg  BID. -Continue Metoprolol  succinate 25mg  daily. -Continue Eliquis  5mg  BID. -See EP specialist on Monday to discuss ablation.   Fatigue SOB -encouraged to drink water. Symptoms are difficult to delineate.   Chest Pain Achiness in chest. Nuclear medicine stress test showed infarct, likely artifact. -Order Coronary CTA to further define coronary anatomy. -Take two tablets of Metoprolol  succinate 25mg  on the day of CT scan.  Hyperlipidemia Most recent LDL 89. -Continue Crestor  5mg  daily.  Hypertension Blood pressure 128/78, stable. -Continue Metoprolol  succinate 25mg  daily. -Continue Olmesartan 5mg  daily.  Diabetes A1c 8.2, symptoms of low energy may be related. -See diabetes doctor for further management.  Follow-up in 6 months to discuss further.              Follow-up: Return in about 6 months (around 07/06/2024).  Signed, Darryle DASEN. Barbaraann, MD, [facility]  [facility]  [address] [place], [place] [address] [phone]  10:03 AM

## 2024-01-07 ENCOUNTER — Encounter: Payer: Self-pay | Admitting: Cardiovascular Disease

## 2024-01-07 ENCOUNTER — Ambulatory Visit: Payer: 59 | Attending: Cardiovascular Disease | Admitting: Cardiovascular Disease

## 2024-01-07 ENCOUNTER — Other Ambulatory Visit: Payer: Self-pay | Admitting: Internal Medicine

## 2024-01-07 VITALS — BP 128/78 | HR 70 | Ht 61.0 in | Wt 154.0 lb

## 2024-01-07 DIAGNOSIS — R072 Precordial pain: Secondary | ICD-10-CM

## 2024-01-07 DIAGNOSIS — E782 Mixed hyperlipidemia: Secondary | ICD-10-CM

## 2024-01-07 DIAGNOSIS — I10 Essential (primary) hypertension: Secondary | ICD-10-CM | POA: Diagnosis not present

## 2024-01-07 DIAGNOSIS — I493 Ventricular premature depolarization: Secondary | ICD-10-CM

## 2024-01-07 DIAGNOSIS — Z01812 Encounter for preprocedural laboratory examination: Secondary | ICD-10-CM

## 2024-01-07 DIAGNOSIS — I471 Supraventricular tachycardia, unspecified: Secondary | ICD-10-CM | POA: Diagnosis not present

## 2024-01-07 DIAGNOSIS — I480 Paroxysmal atrial fibrillation: Secondary | ICD-10-CM

## 2024-01-07 DIAGNOSIS — E119 Type 2 diabetes mellitus without complications: Secondary | ICD-10-CM

## 2024-01-07 LAB — BASIC METABOLIC PANEL
BUN/Creatinine Ratio: 12 (ref 12–28)
BUN: 8 mg/dL (ref 8–27)
CO2: 24 mmol/L (ref 20–29)
Calcium: 9.2 mg/dL (ref 8.7–10.3)
Chloride: 102 mmol/L (ref 96–106)
Creatinine, Ser: 0.65 mg/dL (ref 0.57–1.00)
Glucose: 168 mg/dL — ABNORMAL HIGH (ref 70–99)
Potassium: 4.5 mmol/L (ref 3.5–5.2)
Sodium: 141 mmol/L (ref 134–144)
eGFR: 88 mL/min/{1.73_m2} (ref >59)

## 2024-01-07 MED ORDER — MULTAQ 400 MG PO TABS: 400.0000 mg | ORAL_TABLET | Freq: Two times a day (BID) | ORAL | 3 refills | Status: DC

## 2024-01-07 NOTE — Patient Instructions (Addendum)
 Medication Instructions:   - STARTdronederone (MULTAQ ) 400mg  daily   - Take metoprolol  succinate (TOPROL  XL) 50MG  (2 tablets) two hours prior to test.   *If you need a refill on your cardiac medications before your next appointment, please call your pharmacy*   Lab Work: BMET    If you have labs (blood work) drawn today and your tests are completely normal, you will receive your results only by: MyChart Message (if you have MyChart) OR A paper copy in the mail If you have any lab test that is abnormal or we need to change your treatment, we will call you to review the results.   Testing/Procedures:   Your cardiac CT will be scheduled at one of the below locations:   [facility] [address] [place], [place] [address] [phone]    If scheduled at [facility], please arrive at the [facility] and Children's Entrance (Entrance C2) of [facility] 30 minutes prior to test start time. You can use the FREE valet parking offered at entrance C (encouraged to control the heart rate for the test)  Proceed to the [facility] Radiology Department (first floor) to check-in and test prep.  All radiology patients and guests should use entrance C2 at [facility], accessed from [facility], even though the hospital's physical address listed is [address].     Please follow these instructions carefully (unless otherwise directed):  An IV will be required for this test and Nitroglycerin  will be given.  Hold all erectile dysfunction medications at least 3 days (72 hrs) prior to test. (Ie viagra, cialis, sildenafil, tadalafil, etc)   On the Night Before the Test: Be sure to Drink plenty of water. Do not consume any caffeinated/decaffeinated beverages or chocolate 12 hours prior to your test. Do not take any antihistamines 12 hours prior to your test.   On the Day of the Test: Drink plenty of water until 1 hour prior to the  test. Do not eat any food 1 hour prior to test. You may take your regular medications prior to the test.  Take metoprolol  succinate (TOPROL  XL) 50 MG(2 tablet) two hours prior to test. If you take Furosemide/Hydrochlorothiazide /Spironolactone/Chlorthalidone, please HOLD on the morning of the test. Patients who wear a continuous glucose monitor MUST remove the device prior to scanning. FEMALES- please wear underwire-free bra if available, avoid dresses & tight clothing       After the Test: Drink plenty of water. After receiving IV contrast, you may experience a mild flushed feeling. This is normal. On occasion, you may experience a mild rash up to 24 hours after the test. This is not dangerous. If this occurs, you can take Benadryl 25 mg, Zyrtec, Claritin, or Allegra and increase your fluid intake. (Patients taking Tikosyn should avoid Benadryl, and may take Zyrtec, Claritin, or Allegra) If you experience trouble breathing, this can be serious. If it is severe call 911 IMMEDIATELY. If it is mild, please call our office.  We will call to schedule your test 2-4 weeks out understanding that some insurance companies will need an authorization prior to the service being performed.   For more information and frequently asked questions, please visit our website : [email or web address]  For non-scheduling related questions, please contact the cardiac imaging nurse navigator should you have any questions/concerns: Cardiac Imaging Nurse Navigators Direct Office Dial : [phone]   For scheduling needs, including cancellations and rescheduling, please call Brittany, [phone].    Follow-Up:  At [facility], you and your health needs are our priority.  As part of our continuing mission to provide you with exceptional heart care, we have created designated Provider Care Teams.  These Care Teams include your primary Cardiologist (physician) and Advanced Practice Providers (APPs -   Physician Assistants and Nurse Practitioners) who all work together to provide you with the care you need, when you need it.  We recommend signing up for the patient portal called MyChart.  Sign up information is provided on this After Visit Summary.  MyChart is used to connect with patients for Virtual Visits (Telemedicine).  Patients are able to view lab/test results, encounter notes, upcoming appointments, etc.  Non-urgent messages can be sent to your provider as well.   To learn more about what you can do with MyChart, go to [email or web address].    Your next appointment:   6 month(s)  The format for your next appointment:   In Person  Provider:   Darryle ONEIDA Decent, MD   .ct  Other Instructions

## 2024-01-08 ENCOUNTER — Ambulatory Visit: Payer: 59 | Admitting: Cardiovascular Disease

## 2024-01-10 NOTE — Progress Notes (Signed)
 Electrophysiology Office Note:   Date:  01/11/2024  ID:  [name], DOB [birth year], MRN [id number]  Primary Cardiologist: Oneil Bigness, MD Electrophysiologist: Ardeen Kohler, MD      History of Present Illness:   [name] is a 83 y.o. female with h/o hypertension, hyperlipidemia, paroxysmal atrial fibrillation who is being seen today for evaluation of atrial fibrillation at the request of Dr. Jackquelyn Mass.  Discussed the use of AI scribe software for clinical note transcription with the patient, who gave verbal consent to proceed.  History of Present Illness   The patient, with a history of heart rhythm abnormalities, presents with complaints of palpitations, weakness, and shortness of breath. These symptoms have been ongoing for several months. The patient describes the palpitations as her heart racing and slowing down at times. The weakness is significant, limiting her daily activities and causing her to feel out of breath easily, and is constant throughout the day. There has been some improvement since starting metoprolol . A heart monitor worn by the patient revealed short bursts of fast heart rates. The patient is yet to start dronedarone  and is awaiting a CT scan to assess for potential heart artery blockages.     Review of systems complete and found to be negative unless listed in HPI.   EP Information / Studies Reviewed:    EKG is ordered today. Personal review as below.  EKG Interpretation Date/Time:  Monday January 11 2024 11:44:31 EST Ventricular Rate:  76 PR Interval:  202 QRS Duration:  66 QT Interval:  386 QTC Calculation: 434 R Axis:   30  Text Interpretation: Normal sinus rhythm Low voltage QRS When compared with ECG of 02-Oct-2023 09:57, No significant change was found Confirmed by Ardeen Kohler [phone]) on 01/11/2024 11:47:08 AM   Echo 11/05/23:  1. Left ventricular ejection fraction, by estimation, is 60 to 65%. The  left ventricle has normal function.  The left ventricle has no regional  wall motion abnormalities. Left ventricular diastolic parameters are  consistent with Grade I diastolic  dysfunction (impaired relaxation).   2. Right ventricular systolic function is normal. The right ventricular  size is normal. Tricuspid regurgitation signal is inadequate for assessing  PA pressure.   3. The mitral valve is degenerative. No evidence of mitral valve  regurgitation. Moderate mitral annular calcification.   4. The aortic valve is grossly normal. Aortic valve regurgitation is not  visualized.   5. The inferior vena cava is normal in size with greater than 50%  respiratory variability, suggesting right atrial pressure of 3 mmHg.   Nuclear Stress 10/2023:   Findings are consistent with no ischemia. The study is low risk.   No ST deviation was noted. The ECG was not diagnostic due to pharmacologic protocol.   LV perfusion is abnormal. Defect 1: There is a large defect with moderate reduction in uptake present in the apical to basal inferior, inferolateral, inferoseptal and apex location(s) that is fixed. There is normal wall motion in the defect area.   Left ventricular function is abnormal. Global function is mildly reduced. There were no regional wall motion abnormalities. Nuclear stress EF: 53%. The left ventricular ejection fraction is mildly decreased (45-54%). End diastolic cavity size is normal. End systolic cavity size is normal.   Prior study not available for comparison.  Zio 11/05/23: Patch Wear Time:  13 days and 21 hours (2024-11-11T13:25:05-499 to 2024-11-25T10:37:20-0500)   Patient had a min HR of 55 bpm (sinus bradycardia), max HR of 272 bpm (supraventricular  tachycardia), and avg HR of 80 bpm (normal sinus rhythm). Predominant underlying rhythm was Sinus Rhythm. 33 non-sustained Ventricular Tachycardia runs occurred, the run with the fastest interval lasting 11 beats (4.4 second duration) with a max rate of 190 bpm (avg 176  bpm); the run with the fastest interval was also the longest. Episodes of Ventricular Tachycardia may be Supraventricular Tachycardia with possible aberrancy. 614 Supraventricular Tachycardia runs occurred, the run with the fastest interval lasting 14 beats (4.1 second duration) with a max rate of 272 bpm, the longest lasting 20 mins 49 secs with an avg rate of 174 bpm. Some episodes of Supraventricular Tachycardia appear to be atrial fibrillation. Isolated SVEs were rare (<1.0%), SVE Couplets were rare (<1.0%), and SVE Triplets were rare (<1.0%). Isolated VEs were occasional (3.2%, 44742), VE Couplets were rare (<1.0%, 2671), and VE Triplets were rare (<1.0%, 132). Ventricular Bigeminy and Trigeminy were present.    Impression: SVT was present but some appears to be atrial fibrillation.  Occasional PVCs (3.2% burden).   Risk Assessment/Calculations:    CHA2DS2-VASc Score = 5   This indicates a 7.2% annual risk of stroke. The patient's score is based upon: CHF History: 0 HTN History: 1 Diabetes History: 1 Stroke History: 0 Vascular Disease History: 0 Age Score: 2 Gender Score: 1        Physical Exam:   VS:  BP (!) 142/80 (BP Location: Right Arm, Patient Position: Sitting, Cuff Size: Normal)   Pulse 76   Ht 5\' 1"  (1.549 m)   Wt 152 lb (68.9 kg)   SpO2 96%   BMI 28.72 kg/m    Wt Readings from Last 3 Encounters:  01/11/24 152 lb (68.9 kg)  01/07/24 154 lb (69.9 kg)  11/16/23 153 lb 6.4 oz (69.6 kg)     GEN: Well nourished, well developed in no acute distress NECK: No JVD CARDIAC: Normal rate, regular rhythm RESPIRATORY:  Clear to auscultation without rales, wheezing or rhonchi  ABDOMEN: Soft, non-distended EXTREMITIES:  No edema; No deformity   ASSESSMENT AND PLAN:   [name] is a 83 y.o. female with h/o hypertension, hyperlipidemia, paroxysmal atrial fibrillation who is being seen today for evaluation of atrial fibrillation at the request of Dr. Jackquelyn Mass.  #Paroxysmal atrial fibrillation: Patient had short paroxysms of likely AF on Zio monitor, longest lasting 20 minutes. Despite paroxysmal nature and relatively short duration of episodes, patient reports constant daily fatigue. This appears to be out of proportion to her burden of atrial fibrillation. We discussed treatment options for AF. Before pursuing more aggressive measures, would like to prove correlation of AF with her symptoms. We will trial AAD therapy for now then repeat Zio monitor to see if decrease in burden results in an improvement in symptoms.  #Secondary hypercoagulable state due to atrial fibrillation: CHADSVASC score of 5. - Start dronedarone . She is awaiting CTA coronary. If CTA is normal then flecainide would be an option long-term.  - Continue metoprolol . - Continue Eliqius.  - Repeat Zio monitor in 1 month to assess AF burden on dronedarone  and to see if episodes correlate with symptoms.   #. SVT: Zio reported frequent episodes of SVT. Some are likely AF as mentioned above. Some are most likely bursts of atrial tachycardia. Cannot r/o other paroxysmal SVTs.  #. Palpitations: - Continue metoprolol . Patient starting dronedarone .  - Repeat Zio in 1 month as above.   #Hypertension -Above goal today.  Recommend checking blood pressures 1-2 times per week at home and  recording the values.  Recommend bringing these recordings to the primary care physician.  Follow up with EP APP  in 8 weeks.   Total time of encounter: 65 minutes total time of encounter, including chart review, face-to-face patient care, coordination of care and counseling regarding high complexity medical decision making.  Signed, Ardeen Kohler, MD

## 2024-01-11 ENCOUNTER — Encounter: Payer: Self-pay | Admitting: Cardiology

## 2024-01-11 ENCOUNTER — Ambulatory Visit: Payer: 59 | Attending: Cardiology | Admitting: Cardiology

## 2024-01-11 VITALS — BP 142/80 | HR 76 | Ht 61.0 in | Wt 152.0 lb

## 2024-01-11 DIAGNOSIS — D6869 Other thrombophilia: Secondary | ICD-10-CM | POA: Diagnosis not present

## 2024-01-11 DIAGNOSIS — I10 Essential (primary) hypertension: Secondary | ICD-10-CM | POA: Diagnosis not present

## 2024-01-11 DIAGNOSIS — R002 Palpitations: Secondary | ICD-10-CM | POA: Diagnosis not present

## 2024-01-11 DIAGNOSIS — I471 Supraventricular tachycardia, unspecified: Secondary | ICD-10-CM | POA: Diagnosis not present

## 2024-01-11 DIAGNOSIS — I480 Paroxysmal atrial fibrillation: Secondary | ICD-10-CM | POA: Diagnosis not present

## 2024-01-11 NOTE — Patient Instructions (Signed)
 Medication Instructions:  Your physician recommends that you continue on your current medications as directed. Please refer to the Current Medication list given to you today.  *If you need a refill on your cardiac medications before your next appointment, please call your pharmacy*   Testing/Procedures: IN ONE MONTH: Your physician has recommended that you wear an event monitor. Event monitors are medical devices that record the heart's electrical activity. Doctors most often us  these monitors to diagnose arrhythmias. Arrhythmias are problems with the speed or rhythm of the heartbeat. The monitor is a small, portable device. You can wear one while you do your normal daily activities. This is usually used to diagnose what is causing palpitations/syncope (passing out).   Follow-Up: At [facility], you and your health needs are our priority.  As part of our continuing mission to provide you with exceptional heart care, we have created designated Provider Care Teams.  These Care Teams include your primary Cardiologist (physician) and Advanced Practice Providers (APPs -  Physician Assistants and Nurse Practitioners) who all work together to provide you with the care you need, when you need it.   Your next appointment:   2 months  Provider:   You will see one of the following Advanced Practice Providers on your designated Care Team:   Mertha Abrahams, Kennard Pea "Jonelle Neri" [place], PA-C Suzann Riddle, NP Creighton Doffing, NP  Other Instructions Delane Fear- Long Term Monitor Instructions  Your physician has requested you wear a ZIO patch monitor for 14 days.  This is a single patch monitor. [employer] supplies one patch monitor per enrollment. Additional stickers are not available. Please do not apply patch if you will be having a Nuclear Stress Test,  Echocardiogram, Cardiac CT, MRI, or Chest Xray during the period you would be wearing the  monitor. The patch cannot be worn during these tests. You  cannot remove and re-apply the  ZIO XT patch monitor.  Your ZIO patch monitor will be mailed 3 day USPS to your address on file. It may take 3-5 days  to receive your monitor after you have been enrolled.  Once you have received your monitor, please review the enclosed instructions. Your monitor  has already been registered assigning a specific monitor serial # to you.  Billing and Patient Assistance Program Information  We have supplied [employer] with any of your insurance information on file for billing purposes. [employer] offers a sliding scale Patient Assistance Program for patients that do not have  insurance, or whose insurance does not completely cover the cost of the ZIO monitor.  You must apply for the Patient Assistance Program to qualify for this discounted rate.  To apply, please call [employer] at [phone], select option 4, select option 2, ask to apply for  Patient Assistance Program. Sanna Crystal will ask your household income, and how many people  are in your household. They will quote your out-of-pocket cost based on that information.  [employer] will also be able to set up a 22-month, interest-free payment plan if needed.  Applying the monitor   Shave hair from upper left chest.  Hold abrader disc by orange tab. Rub abrader in 40 strokes over the upper left chest as  indicated in your monitor instructions.  Clean area with 4 enclosed alcohol pads. Let dry.  Apply patch as indicated in monitor instructions. Patch will be placed under collarbone on left  side of chest with arrow pointing upward.  Rub patch adhesive wings for 2 minutes. Remove white label  marked "1". Remove the white  label marked "2". Rub patch adhesive wings for 2 additional minutes.  While looking in a mirror, press and release button in center of patch. A small green light will  flash 3-4 times. This will be your only indicator that the monitor has been turned on.  Do not shower for the first 24 hours. You may  shower after the first 24 hours.  Press the button if you feel a symptom. You will hear a small click. Record Date, Time and  Symptom in the Patient Logbook.  When you are ready to remove the patch, follow instructions on the last 2 pages of Patient  Logbook. Stick patch monitor onto the last page of Patient Logbook.  Place Patient Logbook in the blue and white box. Use locking tab on box and tape box closed  securely. The blue and white box has prepaid postage on it. Please place it in the mailbox as  soon as possible. Your physician should have your test results approximately 7 days after the  monitor has been mailed back to [facility].  Call [facility] Customer Care at [phone] if you have questions regarding  your ZIO XT patch monitor. Call them immediately if you see an orange light blinking on your  monitor.  If your monitor falls off in less than 4 days, contact our Monitor department at [phone].  If your monitor becomes loose or falls off after 4 days call [employer] at [phone] for  suggestions on securing your monitor

## 2024-01-19 DIAGNOSIS — M542 Cervicalgia: Secondary | ICD-10-CM | POA: Diagnosis not present

## 2024-01-19 DIAGNOSIS — M25511 Pain in right shoulder: Secondary | ICD-10-CM | POA: Diagnosis not present

## 2024-01-19 DIAGNOSIS — M25512 Pain in left shoulder: Secondary | ICD-10-CM | POA: Diagnosis not present

## 2024-01-19 DIAGNOSIS — M791 Myalgia, unspecified site: Secondary | ICD-10-CM | POA: Diagnosis not present

## 2024-02-02 ENCOUNTER — Telehealth: Payer: Self-pay | Admitting: Cardiovascular Disease

## 2024-02-02 DIAGNOSIS — Z01812 Encounter for preprocedural laboratory examination: Secondary | ICD-10-CM

## 2024-02-02 DIAGNOSIS — R079 Chest pain, unspecified: Secondary | ICD-10-CM

## 2024-02-02 NOTE — Telephone Encounter (Signed)
 Patient identification verified by 2 forms. [name], RN    Called and spoke to patient  Informed patient:   -order placed   -awaiting for CTA approval from insurance   -she will be outreached for scheduling CTA   -present to lab after CTA appointment made, at least 2 days prior to CTA  Patient verbalized understanding, no questions at this time

## 2024-02-02 NOTE — Telephone Encounter (Signed)
 New Message::     Patient said the last time she saw Dr Flora Lipps, he said he wanted her to have a CT Test. She said when she called to see about scheduling the test, she was told that they need an order from Dr Flora Lipps please.

## 2024-02-09 ENCOUNTER — Other Ambulatory Visit: Payer: Self-pay

## 2024-02-09 ENCOUNTER — Ambulatory Visit: Attending: Cardiology

## 2024-02-09 DIAGNOSIS — I480 Paroxysmal atrial fibrillation: Secondary | ICD-10-CM

## 2024-02-09 DIAGNOSIS — I471 Supraventricular tachycardia, unspecified: Secondary | ICD-10-CM

## 2024-02-09 DIAGNOSIS — I493 Ventricular premature depolarization: Secondary | ICD-10-CM

## 2024-02-09 DIAGNOSIS — R002 Palpitations: Secondary | ICD-10-CM

## 2024-02-15 ENCOUNTER — Telehealth: Payer: Self-pay | Admitting: Cardiology

## 2024-02-15 ENCOUNTER — Telehealth: Payer: Self-pay | Admitting: Cardiovascular Disease

## 2024-02-15 DIAGNOSIS — Z01812 Encounter for preprocedural laboratory examination: Secondary | ICD-10-CM | POA: Diagnosis not present

## 2024-02-15 NOTE — Telephone Encounter (Signed)
 [name], Jennifer A  YouJust now (3:58 PM)    Patient scheduled to come in Thursday at 10 to have her monitor applied.

## 2024-02-15 NOTE — Telephone Encounter (Signed)
 Spoke with pt, she is aware she can put on the monitor after the CT scan. Also aware to take her metoprolol 2 tablets 2 hours prior to CT scan. Appointment for patient to get monitor put on at [address].

## 2024-02-15 NOTE — Telephone Encounter (Signed)
 Left the pt a message to call the office back.  Will route this message to our monitor dept/tech and Dr. Lavone Neri RN, for further management when pt returns a call back.

## 2024-02-15 NOTE — Telephone Encounter (Signed)
 Pt called in stating someone told her to come by the office and have her Heart monitor put on after her CT Morph. Please advise if an appt needs to be set up for this.

## 2024-02-15 NOTE — Telephone Encounter (Signed)
 Pt called in stating she is supposed to take a medication prior to her CT and she wants to know what time should she take it. Please advise.

## 2024-02-16 ENCOUNTER — Telehealth (HOSPITAL_COMMUNITY): Payer: Self-pay | Admitting: *Deleted

## 2024-02-16 LAB — BASIC METABOLIC PANEL
BUN/Creatinine Ratio: 18 (ref 12–28)
BUN: 16 mg/dL (ref 8–27)
CO2: 19 mmol/L — ABNORMAL LOW (ref 20–29)
Calcium: 9.4 mg/dL (ref 8.7–10.3)
Chloride: 99 mmol/L (ref 96–106)
Creatinine, Ser: 0.89 mg/dL (ref 0.57–1.00)
Glucose: 330 mg/dL — ABNORMAL HIGH (ref 70–99)
Potassium: 5.1 mmol/L (ref 3.5–5.2)
Sodium: 134 mmol/L (ref 134–144)
eGFR: 65 mL/min/{1.73_m2} (ref >59)

## 2024-02-16 NOTE — Telephone Encounter (Signed)
 Attempted to call patient regarding upcoming cardiac CT appointment. Left message on voicemail with name and callback number Johney Frame RN Navigator Cardiac Imaging [facility] [phone] Office

## 2024-02-16 NOTE — Telephone Encounter (Signed)
 Reaching out to patient to offer assistance regarding upcoming cardiac imaging study; pt verbalizes understanding of appt date/time, parking situation and where to check in, pre-test NPO status and medications ordered, and verified current allergies; name and call back number provided for further questions should they arise Johney Frame RN Navigator Cardiac Imaging [facility] [phone] office [phone] cell

## 2024-02-17 ENCOUNTER — Ambulatory Visit (HOSPITAL_COMMUNITY)
Admission: RE | Admit: 2024-02-17 | Discharge: 2024-02-17 | Disposition: A | Discharge status: Home / Self Care | Source: Ambulatory Visit | Attending: Cardiovascular Disease | Admitting: Cardiovascular Disease

## 2024-02-17 DIAGNOSIS — R079 Chest pain, unspecified: Secondary | ICD-10-CM | POA: Diagnosis not present

## 2024-02-17 DIAGNOSIS — I2510 Atherosclerotic heart disease of native coronary artery without angina pectoris: Secondary | ICD-10-CM | POA: Diagnosis not present

## 2024-02-17 MED ORDER — NITROGLYCERIN 0.4 MG SL SUBL
0.8000 mg | SUBLINGUAL_TABLET | Freq: Once | SUBLINGUAL | Status: AC
  Administered 2024-02-17: 0.8 mg via SUBLINGUAL

## 2024-02-17 MED ORDER — DILTIAZEM HCL 25 MG/5ML IV SOLN
10.0000 mg | INTRAVENOUS | Status: DC | PRN
Start: 2024-02-17 — End: 2024-02-18

## 2024-02-17 MED ORDER — METOPROLOL TARTRATE 5 MG/5ML IV SOLN
10.0000 mg | Freq: Once | INTRAVENOUS | Status: AC | PRN
  Administered 2024-02-17: 10 mg via INTRAVENOUS

## 2024-02-17 MED ORDER — NITROGLYCERIN 0.4 MG SL SUBL
SUBLINGUAL_TABLET | SUBLINGUAL | Status: AC
  Filled 2024-02-17: qty 2

## 2024-02-17 MED ORDER — IOHEXOL 350 MG/ML SOLN
95.0000 mL | Freq: Once | INTRAVENOUS | Status: AC | PRN
  Administered 2024-02-17: 95 mL via INTRAVENOUS

## 2024-02-17 MED ORDER — METOPROLOL TARTRATE 5 MG/5ML IV SOLN
INTRAVENOUS | Status: AC
  Filled 2024-02-17: qty 10

## 2024-02-18 ENCOUNTER — Ambulatory Visit
Admission: RE | Admit: 2024-02-18 | Discharge: 2024-02-18 | Disposition: A | Discharge status: Home / Self Care | Source: Ambulatory Visit | Attending: Cardiology | Admitting: Cardiology

## 2024-02-18 ENCOUNTER — Telehealth: Payer: Self-pay | Admitting: Cardiovascular Disease

## 2024-02-18 ENCOUNTER — Other Ambulatory Visit: Payer: Self-pay | Admitting: Cardiology

## 2024-02-18 ENCOUNTER — Ambulatory Visit

## 2024-02-18 ENCOUNTER — Other Ambulatory Visit: Payer: Self-pay | Admitting: Internal Medicine

## 2024-02-18 DIAGNOSIS — E119 Type 2 diabetes mellitus without complications: Secondary | ICD-10-CM

## 2024-02-18 DIAGNOSIS — R931 Abnormal findings on diagnostic imaging of heart and coronary circulation: Secondary | ICD-10-CM | POA: Diagnosis not present

## 2024-02-18 DIAGNOSIS — I2510 Atherosclerotic heart disease of native coronary artery without angina pectoris: Secondary | ICD-10-CM | POA: Diagnosis not present

## 2024-02-18 NOTE — Telephone Encounter (Signed)
 Sande Rives, MD  You; Jonah Blue, RNJust now (4:31 PM)    Hold until seen by EP in April.  [name] T. Flora Lipps, MD, [facility]  [facility] [address] [place], [place] [address] [phone] 4:31 PM   You  Sande Rives, MD; Jonah Blue, RN20 minutes ago (4:12 PM)    How long should I advise her to hold it for?   Sande Rives, MD  You; Cv Div Pharmd24 minutes ago (4:07 PM)    Hold eliquis and see if this helps.  [name] T. Flora Lipps, MD, [facility]  [facility] [address] [place], [place] [address] [phone] 4:07 PM

## 2024-02-18 NOTE — Telephone Encounter (Signed)
 Patient identification verified by 2 forms. [name], RN    Called and spoke to patient  Relayed provider message below  Patient agrees with plan, no further questions at this time

## 2024-02-18 NOTE — Telephone Encounter (Signed)
 Patient identification verified by 2 forms. [name], RN    Called and spoke to patients  Patient states:   -she gets nose bleeds   -nose bleeds started 1 month ago  -does not occur daily   -nose bleeds occur 3x a week (varies week to week)   -when nose bleed occurs it "gushes" out   -bleeding stops after a few minutes of holding pressure   -unsure if related to Eliquis Rx   -takes Eliquis 5mg  twice a day  Patient denies:   -new/worsening lightheadedness/dizziness   -Black/tarry stool  Informed patient message sent to Dr. O'Neal/pharmacy  Patient verbalized understanding, no questions or concerns at this time

## 2024-02-18 NOTE — Progress Notes (Unsigned)
 ZIO Serial # [id number] mailed to patient and applied at the [facility] office.

## 2024-02-18 NOTE — Telephone Encounter (Signed)
 Pt c/o medication issue:  1. Name of Medication:   apixaban (ELIQUIS) 5 MG TABS tablet    2. How are you currently taking this medication (dosage and times per day)? As written  3. Are you having a reaction (difficulty breathing--STAT)? no  4. What is your medication issue? Pt has had several nose bleeds and wants to know if it can be the medication

## 2024-02-23 ENCOUNTER — Telehealth: Payer: Self-pay | Admitting: Internal Medicine

## 2024-02-23 NOTE — Telephone Encounter (Signed)
 I called and spoke with the patient she has been advised and voices understanding.

## 2024-02-23 NOTE — Telephone Encounter (Signed)
 Patient is calling to say that her blood sugar level has been running high since last week (in 300's).  Had CT scan done last week that started the elevation in her blood sugar level.  Patient said also that she was given 2 glycerine pills under her tongue during the procedure as well as something in her IV as well.  Has been able to get it down to the 200's this week, but wants to speak with someone about this.  She knows that she has an appointment this Thursday, 02/24/2024, but would still like to speak with someone prior to this appointment.

## 2024-02-23 NOTE — Telephone Encounter (Signed)
 Message from Dr. Elvera Lennox: MS. [name] ([phone])- I saw her message about the high CBGs in the 300 - please restart Humalog 5-6 units before meals and can also use this to bring sugars down now x 1 dose now. She needs to stay off Repaglinide while on Humalog

## 2024-02-25 ENCOUNTER — Ambulatory Visit (INDEPENDENT_AMBULATORY_CARE_PROVIDER_SITE_OTHER): Payer: 59 | Admitting: Internal Medicine

## 2024-02-25 ENCOUNTER — Encounter: Payer: Self-pay | Admitting: Internal Medicine

## 2024-02-25 VITALS — BP 120/64 | HR 84 | Ht 61.0 in | Wt 148.0 lb

## 2024-02-25 DIAGNOSIS — E1165 Type 2 diabetes mellitus with hyperglycemia: Secondary | ICD-10-CM

## 2024-02-25 DIAGNOSIS — E039 Hypothyroidism, unspecified: Secondary | ICD-10-CM

## 2024-02-25 DIAGNOSIS — Z7984 Long term (current) use of oral hypoglycemic drugs: Secondary | ICD-10-CM

## 2024-02-25 DIAGNOSIS — E042 Nontoxic multinodular goiter: Secondary | ICD-10-CM | POA: Diagnosis not present

## 2024-02-25 LAB — POCT GLYCOSYLATED HEMOGLOBIN (HGB A1C): Hemoglobin A1C: 7.7 % — AB (ref 4.0–5.6)

## 2024-02-25 LAB — GLUCOSE, POCT (MANUAL RESULT ENTRY): POC Glucose: 313 mg/dL — AB (ref 70–99)

## 2024-02-25 MED ORDER — DEXCOM G7 SENSOR MISC: 3.0000 | 4 refills | Status: DC

## 2024-02-25 MED ORDER — DEXCOM G7 RECEIVER DEVI: 1.0000 | Freq: Once | 0 refills | Status: AC

## 2024-02-25 NOTE — Progress Notes (Signed)
 Patient ID: [name], female   DOB: [birth year], 83 y.o.   MRN: [id number]  HPI: [name] is a 83 y.o.-year-old female, returning for follow-up for DM2, dx in 1993, non-insulin-dependent, uncontrolled, with complications (PN). Pt. previously saw Dr. Everardo All, but last visit with me 4 months ago.   She is here with her sister who offers part of the history.  Interim history: No blurry vision, chest pain.  She does have increased urination. She describes that after her CT scan from 02/18/2024, she started to have nausea and her sugars significantly increased, to the 300s.  She contacted [place] 2 days ago and we added mealtime insulin.  Sugars started to improve but they are still elevated.  Reviewed HbA1c: Lab Results  Component Value Date   HGBA1C 8.2 (H) 10/02/2023   HGBA1C 6.8 06/09/2023   HGBA1C 9.8 (A) 01/20/2023   HGBA1C 6.2 (A) 08/12/2022   HGBA1C 7.0 (A) 04/07/2022   HGBA1C 7.9 (A) 02/18/2022   HGBA1C 9.6 (A) 12/17/2021   HGBA1C 8.3 (A) 10/11/2021   HGBA1C 7.0 (A) 04/25/2021   HGBA1C 7.0 (A) 01/24/2021   Pt is on a regimen of: - Metformin ER 500 mg 2x daily - Acarbose 25 mg 3x a day - Rybelsus 14 mg in am - Humalog 5-6 units before meals - started back 02/23/2024 She tried bromocriptine >> glycemia. She tried Invokana >> dizziness. She tried [place] >> hypotension. She tried pioglitazone >> edema. She was previously on Prandin, but stopped 01/2024 after starting Humalog.  Pt checks her sugars 0-1x a day: - am: 95-140s >> 97-171 >> 103-158, 163 >> 135-230, 286 >> 300-390 after the CT scan) - now 200s - 2h after b'fast: n/c >> 158, 228 - before lunch: 119-178, 273 >> 116-163 >> 116-163 >> n/c - 2h after lunch: n/c - before dinner: 102-177, 201-263 (steroid) >> 152-275, 309 >> 200s now - 2h after dinner: n/c - bedtime: n/c>> 116-184>> 127-180 >> 137-265 >> n/c - nighttime: n/c Lowest sugar was 97 >> 135 >> 140; she has hypoglycemia awareness at 70.  Highest sugar was  160 >> 273 >> 309 >> 390  Glucometer: Accu Check guide  Pt's meals are: - Breakfast: egg and toast; sometimes oatmeal, sometimes grits - Lunch: pimento or bologna cheese sandwich - Dinner: TV dinner - Snacks: sugar free jello, mints  - no CKD, last BUN/creatinine:  Lab Results  Component Value Date   BUN 16 02/15/2024   BUN 8 01/07/2024   CREATININE 0.89 02/15/2024   CREATININE 0.65 01/07/2024  09/17/2023: 23/0.68, GFR 75, ACR 1.47  Lab Results  Component Value Date   MICRALBCREAT 1.6 07/19/2014   MICRALBCREAT 1.6 05/18/2013  She is is on Benicar 5 mg daily.  - + HL; last set of lipids: 09/17/2023: 183/202/61/89 No results found for: "CHOL", "HDL", "LDLCALC", "LDLDIRECT", "TRIG", "CHOLHDL" She is on Crestor 5 mg daily.  - last eye exam was several years ago. No DR.   - no numbness and tingling in her feet.  On gabapentin 300 mg 3 times a day.  Last foot exam 10/14/2023.  Hypothyroidism:  Pt is on levothyroxine 50 mcg daily, taken: - in am - fasting - immediately from b'fast -+ calcium either in am or in the pm - no iron - no multivitamins - no PPIs, + Pepcid in am >> pm - not on Biotin  Reviewed TSH levels: Lab Results  Component Value Date   TSH 2.170 10/02/2023   TSH 1.59 09/29/2022   TSH  4.34 12/17/2021   TSH 2.21 04/25/2021  08/18/2022: TSH 5.43 (0.34-4.5)  Thyroid nodules:  Reviewed the report of her Thyroid U/S (09/06/2021): Parenchymal Echotexture: Mildly heterogeneous  Isthmus: 0.3 cm  Right lobe: 3.0 x 1.3 x 1.5 cm  Left lobe: 5.3 x 2.3 x 2.3 cm  _________________________________________________________   Nodule # 1:  Location: Right; inferior  Maximum size: 1.2 cm; Other 2 dimensions: 0.8 x 0.9 cm  Composition: solid/almost completely solid (2)  Echogenicity: isoechoic (1)  Given size (<1.4 cm) and appearance, this nodule does NOT meet TI-RADS criteria for biopsy or dedicated follow-up.   _________________________________________________________   Nodule # 2:  Location: Left; mid  Maximum size: 2.1 cm; Other 2 dimensions: 1.1 x 1.5 cm  Composition: solid/almost completely solid (2)  Echogenicity: hypoechoic (2)  **Given size (>/= 1.5 cm) and appearance, fine needle aspiration of this moderately suspicious nodule should be considered based on TI-RADS criteria.  _________________________________________________________   Nodule # 3:  Location: Left; inferior  Maximum size: 2.5 cm; Other 2 dimensions: 2.0 x 2.4 cm  Composition: solid/almost completely solid (2)  Echogenicity: isoechoic (1) **Given size (>/= 2.5 cm) and appearance, fine needle aspiration of this mildly suspicious nodule should be considered based on TI-RADS criteria.  _________________________________________________________   IMPRESSION: 1. Nodule 2 (TI-RADS 4) located in the mid left thyroid lobe meets criteria for FNA. 2. Nodule 3 (TI-RADS 3) located in the inferior left thyroid lobe meets criteria for FNA.  FNA of the 2 nodules (10/02/2021): Clinical History: Left mid 2.1cm; Other 2 dimensions: 1.1 x 1.5cm, Solid  / almost completely solid, Hypoechoic, TI-RADS total points 4  Specimen Submitted:  A. THYROID, LEFT MID, FINE NEEDLE ASPIRATION:  FINAL MICROSCOPIC DIAGNOSIS:  - Follicular lesion of undetermined significance (Bethesda category III)  SPECIMEN ADEQUACY:  Satisfactory for evaluation   Afirma: benign  Clinical History: Left inferior 2.5cm; Other 2 dimensions:2.0 x 2.4cm,  Solid / almost completely solid, Isoechoic, TI-RADS total 3  Specimen Submitted:  A. THYROID, LEFT INFERIOR, FINE NEEDLE ASPIRATION:  FINAL MICROSCOPIC DIAGNOSIS:  - Atypia of undetermined significance (Bethesda category III)  SPECIMEN ADEQUACY:  Satisfactory for evaluation   Afirma: benign  Thyroid U/S (10/21/2023): Parenchymal Echotexture: Mildly heterogenous  Isthmus: 0.3 cm  Right lobe: 2.9 x 1.4 x 1.1  cm  Left lobe: 4.1 x 1.3 x 1.2 cm  _________________________________________________________   Estimated total number of nodules >/= 1 cm: 3 _________________________________________________________   Nodule # 1: Small isoechoic solid nodule in the right lower gland remains stable at 1.2 cm. Given size (<1.4 cm) and appearance, this nodule does NOT meet TI-RADS criteria for biopsy or dedicated follow-up.   Nodules # 2 and # 3: Small subcentimeter nodules in the left upper and mid gland. No further follow-up.   Nodule # 4: Previously biopsied ill-defined nodule in the left mid gland is unchanged at 2.0 x 1.6 x 0.7 cm.   Nodule # 5: Previously biopsied nodule in the left inferior gland measures slightly smaller at 2.2 x 2.2 x 1.8 cm.   IMPRESSION: 1. Decreasing size of previously biopsied nodule # 5 in the left inferior gland. Involution over time is consistent with benignity. Recommend correlation with prior biopsy results. 2. No interval change in the size or appearance of the previously biopsied nodule # 4 in the left mid gland. 3. Additional small nodules are again identified. As before, these do not meet criteria to recommend biopsy or for imaging surveillance. No follow-up imaging recommended. She also has  a history of HTN, depression/anxiety, osteoarthritis.  ROS: + see HPI  Past Medical History:  Diagnosis Date   Anxiety    Arthritis    Cancer (HCC)    Depression    Diabetes mellitus without complication (HCC)    Hypertension    Hypothyroidism    Past Surgical History:  Procedure Laterality Date   ABDOMINAL HYSTERECTOMY     BACK SURGERY  12/01/2004   EXCISION MASS LOWER EXTREMETIES Left 07/11/2019   Procedure: EXCISION MASS foot;  Surgeon: Beverely Low, MD;  Location: [facility];  Service: Orthopedics;  Laterality: Left;   GALLBLADDER SURGERY  12/02/2003   HERNIA REPAIR     KNEE SURGERY  2001/2005   REVERSE SHOULDER ARTHROPLASTY Left  04/18/2022   Procedure: REVERSE SHOULDER ARTHROPLASTY;  Surgeon: Beverely Low, MD;  Location: [facility];  Service: Orthopedics;  Laterality: Left;  with ISB   Social History   Socioeconomic History   Marital status: Single    Spouse name: Not on file   Number of children: 4   Years of education: Not on file   Highest education level: Not on file  Occupational History   Occupation: Retired  Tobacco Use   Smoking status: Former    Current packs/day: 0.00    Average packs/day: 0.1 packs/day for 3.0 years (0.2 ttl pk-yrs)    Types: Cigarettes    Start date: 45    Quit date: 1980    Years since quitting: 45.2   Smokeless tobacco: Never  Vaping Use   Vaping status: Never Used  Substance and Sexual Activity   Alcohol use: Never   Drug use: Never   Sexual activity: Not Currently  Other Topics Concern   Not on file  Social History Narrative   ** Merged History Encounter **       Social Drivers of Corporate investment banker Strain: Not on file  Food Insecurity: Not on file  Transportation Needs: Not on file  Physical Activity: Not on file  Stress: Not on file  Social Connections: Not on file  Intimate Partner Violence: Not on file   Current Outpatient Medications on File Prior to Visit  Medication Sig Dispense Refill   acarbose (PRECOSE) 25 MG tablet TAKE 1 TABLET BY MOUTH THREE TIMES DAILY WITH MEALS 270 tablet 3   Accu-Chek FastClix Lancets MISC USE THREE TIMES DAILY 204 each 3   acetaminophen (TYLENOL) 325 MG tablet Take 500 mg by mouth every 6 (six) hours as needed for pain.     alprazolam (XANAX) 2 MG tablet Take 2 mg by mouth 2 (two) times daily.     apixaban (ELIQUIS) 5 MG TABS tablet Take 1 tablet (5 mg total) by mouth 2 (two) times daily. 60 tablet 3   Calcium Citrate-Vitamin D (CALCIUM CITRATE + D PO) Take 1 tablet by mouth daily.     cholecalciferol (VITAMIN D3) 25 MCG (1000 UNIT) tablet Take 2,000 Units by mouth daily.     cyanocobalamin (VITAMIN B12) 500 MCG  tablet Take 500 mcg by mouth daily.     diclofenac sodium (VOLTAREN) 1 % GEL Apply 4 g topically 4 (four) times daily as needed (LEG PAIN).     dronedarone (MULTAQ) 400 MG tablet Take 1 tablet (400 mg total) by mouth 2 (two) times daily with a meal. 90 tablet 3   famotidine (PEPCID) 20 MG tablet Take 20 mg by mouth daily.     gabapentin (NEURONTIN) 300 MG capsule Take 300 mg by  mouth 3 (three) times daily.     glucose blood (ACCU-CHEK GUIDE) test strip USE TO TEST BLOOD GLUCOSE 3 TIMES DAILY, AS INSTRUCTED. DX CODE: E11.9 300 strip 5   HUMALOG KWIKPEN 100 UNIT/ML KwikPen ADMINISTER 5 TO 6 UNITS UNDER THE SKIN THREE TIMES DAILY 15 mL 11   Insulin Pen Needle 32G X 4 MM MISC Use 3x a day before meals 100 each 3   levothyroxine (SYNTHROID) 50 MCG tablet Take 1 tablet (50 mcg total) by mouth daily before breakfast. 90 tablet 3   metFORMIN (GLUCOPHAGE-XR) 500 MG 24 hr tablet Take 1 tablet (500 mg total) by mouth in the morning and at bedtime. 180 tablet 3   methocarbamol (ROBAXIN) 500 MG tablet Take 1 tablet (500 mg total) by mouth every 8 (eight) hours as needed for muscle spasms. 60 tablet 1   metoprolol succinate (TOPROL XL) 25 MG 24 hr tablet Take 1 tablet (25 mg total) by mouth at bedtime. 90 tablet 3   olmesartan (BENICAR) 5 MG tablet Take 5 mg by mouth daily.     promethazine (PHENERGAN) 25 MG tablet Take 25 mg by mouth every 6 (six) hours as needed for nausea or vomiting.     repaglinide (PRANDIN) 1 MG tablet Take 1 tablet (1 mg total) by mouth 3 (three) times daily before meals. 270 tablet 3   rosuvastatin (CRESTOR) 5 MG tablet Take 1 tablet (5 mg total) by mouth daily. 90 tablet 3   Semaglutide (RYBELSUS) 14 MG TABS TAKE 1 TABLET(14 MG) BY MOUTH DAILY 90 tablet 1   traMADol (ULTRAM) 50 MG tablet Take 50 mg by mouth every 6 (six) hours as needed.     No current facility-administered medications on file prior to visit.   Allergies  Allergen Reactions   Aspirin Other (See Comments)    Pt  states her PCP told her not to take aspirin but she can not remember exactly why.   Codeine Nausea And Vomiting   Hydrocodone Nausea And Vomiting   Latex Itching   Morphine And Codeine Nausea And Vomiting   Darvon [Propoxyphene] Nausea And Vomiting   Family History  Problem Relation Age of Onset   Arthritis Mother    Hyperlipidemia Mother    Heart disease Mother    Hypertension Mother    Diabetes Mother    Arthritis Father    Hyperlipidemia Father    Heart disease Father    Hypertension Father    Diabetes Father    Alcohol abuse Sister    Hypertension Sister    Cancer Sister    PE: BP 120/64   Pulse 84   Ht 5\' 1"  (1.549 m)   Wt 148 lb (67.1 kg)   SpO2 94%   BMI 27.96 kg/m  Wt Readings from Last 10 Encounters:  02/25/24 148 lb (67.1 kg)  01/11/24 152 lb (68.9 kg)  01/07/24 154 lb (69.9 kg)  11/16/23 153 lb 6.4 oz (69.6 kg)  10/14/23 155 lb 12.8 oz (70.7 kg)  10/12/23 155 lb (70.3 kg)  10/02/23 155 lb 9.6 oz (70.6 kg)  06/09/23 156 lb 6.4 oz (70.9 kg)  02/20/23 155 lb 6.4 oz (70.5 kg)  01/20/23 151 lb 6.4 oz (68.7 kg)   Constitutional: normal weight, in NAD Eyes: no exophthalmos ENT: no thyromegaly, no cervical lymphadenopathy Cardiovascular: RRR, No MRG Respiratory: CTA B Musculoskeletal: no deformities Skin:  no rashes Neurological: + Head and hands tremors tremor with outstretched hands  ASSESSMENT: 1. DM2, non-insulin-dependent, uncontrolled, with complications -  PN  2.  Thyroid nodules  3.  Hypothyroidism  PLAN:  1. Patient with longstanding, uncontrolled, type 2 diabetes, on oral antidiabetic regimen with metformin, glucosidase inhibitor, GLP-1 receptor agonist and previously also on a meglitinide, which was changed 2 days ago to Humalog as sugars were very high, and 300s after getting the CT scan. -HbA1c before last visit was increased to 8.2% and sugars were higher.  At that time, I suggested to add Prandin but we ended up stopping this 2 days ago  when switching to Humalog before meals. -At today's visit, she mentions that her sugars started to improve after adding insulin.  They are in the 200s later in the day now, previously in the 300s.  At today's visit, she bolused 6 units of Humalog before breakfast, and at the time of the visit, approximately 2 hours afterwards, her sugars were 303.  At today's visit we discussed about increasing the dose of Humalog to 8-10 units and I also advised her to try to take the insulin 15 minutes before each meal, since now she is taking it right before the meal. -She has nausea which started at the time of her CT scan.  I am not sure why she started to have nausea and also hyperglycemia after the scan.  It does not appear that she got steroids for this.  I advised her to let me know if the sugars did not improve.  For now, we continue to Rybelsus, which she was previously tolerated well.  If nausea continues, we may need to stop this. -I advised her to stay well-hydrated -I also suggested a CGM.  I explained how this works.  She agrees with this, if covered. - I suggested to:  Patient Instructions  Please continue: - Metformin ER 500 mg 2x daily - Acarbose 25 mg 3x a day - Rybelsus 14 mg in am  Increase: - Humalog 8-10 units 15 min before meals   Try to start the Dexcom CGM.  Please continue levothyroxine 50 mcg daily   Take the thyroid hormone every day, with water, at least 30 minutes before breakfast, separated by at least 4 hours from: - acid reflux medications - calcium - iron - multivitamins   Please return in 3 months.  - we checked her HbA1c: 7.7% (lower) - advised to check sugars at different times of the day - 4x a day, rotating check times - advised for yearly eye exams >> she is not UTD - return to clinic in 3-4 months  2.  Thyroid nodules -Patient with history of 3 thyroid nodules, without concerning features, but with 2 dominant nodules larger than 2 cm on the thyroid  ultrasound from 2022 -The 2 dominant larger nodules were biopsied in 10/2021 with indeterminant results, however, Afirma molecular marker returned benign for both nodules -No neck compression symptoms -After last visit, we repeated the thyroid ultrasound and this showed stability of the majority of the nodules, while one of the nodules was slightly smaller.  No imaging follow-up is further needed.  3.  Hypothyroidism - latest thyroid labs reviewed with pt. >> normal: Lab Results  Component Value Date   TSH 2.170 10/02/2023  - she continues on LT4 50 mcg daily - pt feels good on this dose. - we discussed about taking the thyroid hormone every day, with water, >30 minutes before breakfast, separated by >4 hours from acid reflux medications, calcium, iron, multivitamins. Pt. is taking it correctly.  Jennifer Pavlov, MD PhD [facility]

## 2024-02-25 NOTE — Patient Instructions (Addendum)
 Please continue: - Metformin ER 500 mg 2x daily - Acarbose 25 mg 3x a day - Rybelsus 14 mg in am  Increase: - Humalog 8-10 units 15 min before meals   Try to start the Dexcom CGM.  Please continue levothyroxine 50 mcg daily   Take the thyroid hormone every day, with water, at least 30 minutes before breakfast, separated by at least 4 hours from: - acid reflux medications - calcium - iron - multivitamins   Please return in 3 months.

## 2024-02-25 NOTE — Addendum Note (Signed)
 Addended by: Pollie Meyer on: 02/25/2024 11:23 AM   Modules accepted: Orders

## 2024-03-15 ENCOUNTER — Telehealth: Payer: Self-pay | Admitting: Internal Medicine

## 2024-03-15 DIAGNOSIS — I480 Paroxysmal atrial fibrillation: Secondary | ICD-10-CM

## 2024-03-15 DIAGNOSIS — I471 Supraventricular tachycardia, unspecified: Secondary | ICD-10-CM | POA: Diagnosis not present

## 2024-03-15 DIAGNOSIS — I493 Ventricular premature depolarization: Secondary | ICD-10-CM | POA: Diagnosis not present

## 2024-03-15 DIAGNOSIS — R002 Palpitations: Secondary | ICD-10-CM

## 2024-03-15 MED ORDER — INSULIN PEN NEEDLE 32G X 4 MM MISC: 5 refills | Status: DC

## 2024-03-15 NOTE — Telephone Encounter (Signed)
 Patient has changed her pharmacy to the [facility] on [address] and is requesting a new prescription be sent to them for:  Insulin Pen Needles 32G X 4 MM

## 2024-03-15 NOTE — Telephone Encounter (Signed)
 Requested Prescriptions   Signed Prescriptions Disp Refills   Insulin Pen Needle 32G X 4 MM MISC 300 each 5    Sig: Use 3x a day before meals    Authorizing Provider: Emilie Harden    Ordering User: Vernon Goodpasture

## 2024-03-16 ENCOUNTER — Telehealth: Payer: Self-pay | Admitting: Cardiology

## 2024-03-16 NOTE — Telephone Encounter (Signed)
 Follow Up:      Patient was returning call, concerning her Monitor results.

## 2024-03-16 NOTE — Telephone Encounter (Signed)
 Tried to call pt. Phone kept ringing and no answering machine picked up. Unable to leave a message.

## 2024-03-17 DIAGNOSIS — M791 Myalgia, unspecified site: Secondary | ICD-10-CM | POA: Diagnosis not present

## 2024-03-17 DIAGNOSIS — M25512 Pain in left shoulder: Secondary | ICD-10-CM | POA: Diagnosis not present

## 2024-03-17 DIAGNOSIS — M25511 Pain in right shoulder: Secondary | ICD-10-CM | POA: Diagnosis not present

## 2024-03-17 DIAGNOSIS — M542 Cervicalgia: Secondary | ICD-10-CM | POA: Diagnosis not present

## 2024-03-17 NOTE — Telephone Encounter (Signed)
 Patient has been notified.  See comments on monitor results

## 2024-03-21 DIAGNOSIS — E1165 Type 2 diabetes mellitus with hyperglycemia: Secondary | ICD-10-CM | POA: Diagnosis not present

## 2024-03-21 DIAGNOSIS — E559 Vitamin D deficiency, unspecified: Secondary | ICD-10-CM | POA: Diagnosis not present

## 2024-03-21 DIAGNOSIS — M79671 Pain in right foot: Secondary | ICD-10-CM | POA: Diagnosis not present

## 2024-03-21 DIAGNOSIS — D72829 Elevated white blood cell count, unspecified: Secondary | ICD-10-CM | POA: Diagnosis not present

## 2024-03-21 DIAGNOSIS — E782 Mixed hyperlipidemia: Secondary | ICD-10-CM | POA: Diagnosis not present

## 2024-03-21 DIAGNOSIS — E1169 Type 2 diabetes mellitus with other specified complication: Secondary | ICD-10-CM | POA: Diagnosis not present

## 2024-03-21 DIAGNOSIS — I10 Essential (primary) hypertension: Secondary | ICD-10-CM | POA: Diagnosis not present

## 2024-03-21 DIAGNOSIS — K219 Gastro-esophageal reflux disease without esophagitis: Secondary | ICD-10-CM | POA: Diagnosis not present

## 2024-03-21 DIAGNOSIS — E538 Deficiency of other specified B group vitamins: Secondary | ICD-10-CM | POA: Diagnosis not present

## 2024-03-21 DIAGNOSIS — E039 Hypothyroidism, unspecified: Secondary | ICD-10-CM | POA: Diagnosis not present

## 2024-03-21 DIAGNOSIS — J309 Allergic rhinitis, unspecified: Secondary | ICD-10-CM | POA: Diagnosis not present

## 2024-03-21 DIAGNOSIS — M85852 Other specified disorders of bone density and structure, left thigh: Secondary | ICD-10-CM | POA: Diagnosis not present

## 2024-03-22 ENCOUNTER — Other Ambulatory Visit: Payer: Self-pay | Admitting: Family Medicine

## 2024-03-22 DIAGNOSIS — M85852 Other specified disorders of bone density and structure, left thigh: Secondary | ICD-10-CM

## 2024-03-28 NOTE — Progress Notes (Unsigned)
 Cardiology Office Note:  .   Date:  03/28/2024  ID:  Marlow Bowe, DOB December 01, 1941, MRN [id number] PCP: Rae Bugler, MD  [facility] Providers Cardiologist:  Oneil Bigness, MD Electrophysiologist:  Ardeen Kohler, MD {  History of Present Illness: .   Yariza Kreiger is a 83 y.o. female w/PMHx of  HTN, HLD, DM AFib  Dec monitor Impression: SVT was present but some appears to be atrial fibrillation.  Occasional PVCs (3.2% burden).   She saw Dr. Elodia Hailstone 01/03/24, palpitations improved on BB, though ongoing daily symptoms of AFib and started on Multaq , referred to EP. Also c/o some CP and planned for coronary CT  Saw Dr. Daneil Dunker 01/11/24, at this time she had not yet started the Multaq  and was advised to go ahead and start. Pending her CT, if no significant CAD could consider Flecainide if Multaq  failed Discussed that despite paroxysmal nature and relatively short duration of episodes, patient reports constant daily fatigue. This appears to be out of proportion to her burden of atrial fibrillation.  Planned to repeat monitor on AAD and try to better correlate symptoms to arrhythmia  April 2025 monitor HR 55 - 156, average 71 bpm. 1 nonsustained SVT (longest 5 beats) and 1 nonsustained VT (longest 4 beats). No atrial fibrillation detected. Rare supraventricular ectopy. Rare ventricular ectopy. No sustained arrhythmias. Symptom trigger episodes correspond to sinus rhythm with ectopy. And ectopy overall was rare  Today's visit is scheduled as planned f/u ROS:   *** eliquis , dose, bleeding, labs *** symptoms   Arrhythmia/AAD hx Multaq  started Feb 2025  Studies Reviewed: Aaron Aas    EKG not done today   TTE 11/05/2023  1. Left ventricular ejection fraction, by estimation, is 60 to 65%. The  left ventricle has normal function. The left ventricle has no regional  wall motion abnormalities. Left ventricular diastolic parameters are  consistent with Grade I diastolic   dysfunction (impaired relaxation).   2. Right ventricular systolic function is normal. The right ventricular  size is normal. Tricuspid regurgitation signal is inadequate for assessing  PA pressure.   3. The mitral valve is degenerative. No evidence of mitral valve  regurgitation. Moderate mitral annular calcification.   4. The aortic valve is grossly normal. Aortic valve regurgitation is not  visualized.   5. The inferior vena cava is normal in size with greater than 50%  respiratory variability, suggesting right atrial pressure of 3 mmHg.    NM Stress 10/12/2023   Findings are consistent with no ischemia. The study is low risk.   No ST deviation was noted. The ECG was not diagnostic due to pharmacologic protocol.   LV perfusion is abnormal. Defect 1: There is a large defect with moderate reduction in uptake present in the apical to basal inferior, inferolateral, inferoseptal and apex location(s) that is fixed. There is normal wall motion in the defect area.   Left ventricular function is abnormal. Global function is mildly reduced. There were no regional wall motion abnormalities. Nuclear stress EF: 53%. The left ventricular ejection fraction is mildly decreased (45-54%). End diastolic cavity size is normal. End systolic cavity size is normal.   Prior study not available for comparison.   Risk Assessment/Calculations:    Physical Exam:   VS:  There were no vitals taken for this visit.   Wt Readings from Last 3 Encounters:  02/25/24 148 lb (67.1 kg)  01/11/24 152 lb (68.9 kg)  01/07/24 154 lb (69.9 kg)    GEN: Well nourished, well  developed in no acute distress NECK: No JVD; No carotid bruits CARDIAC: ***RRR, no murmurs, rubs, gallops RESPIRATORY:  *** CTA b/l without rales, wheezing or rhonchi  ABDOMEN: Soft, non-tender, non-distended EXTREMITIES: *** No edema; No deformity   ASSESSMENT AND PLAN: .    paroxysmal AFib CHA2DS2Vasc is 5, on Eliquis , *** appropriately dosed No  AFib on her monitor *** symptoms  SVT 1 nonsustained SVT (longest 5 beats) and 1 nonsustained VT (longest 4 beats).  *** HTN ***  Secondary hypercoagulable state 2/2 AFib     {Are you ordering a CV Procedure (e.g. stress test, cath, DCCV, TEE, etc)?   Press F2        :[id number]}     Dispo: ***  Signed, Debbie Fails, PA-C

## 2024-03-29 ENCOUNTER — Ambulatory Visit: Payer: 59 | Admitting: Pulmonary Disease

## 2024-03-29 ENCOUNTER — Encounter: Payer: Self-pay | Admitting: Physician Assistant

## 2024-03-29 ENCOUNTER — Ambulatory Visit: Attending: Physician Assistant | Admitting: Physician Assistant

## 2024-03-29 VITALS — BP 120/74 | HR 75 | Ht 61.0 in | Wt 150.8 lb

## 2024-03-29 DIAGNOSIS — I480 Paroxysmal atrial fibrillation: Secondary | ICD-10-CM | POA: Diagnosis not present

## 2024-03-29 DIAGNOSIS — I10 Essential (primary) hypertension: Secondary | ICD-10-CM | POA: Diagnosis not present

## 2024-03-29 DIAGNOSIS — D6869 Other thrombophilia: Secondary | ICD-10-CM | POA: Diagnosis not present

## 2024-03-29 DIAGNOSIS — I471 Supraventricular tachycardia, unspecified: Secondary | ICD-10-CM | POA: Diagnosis not present

## 2024-03-29 MED ORDER — RIVAROXABAN 20 MG PO TABS: 20.0000 mg | ORAL_TABLET | Freq: Every day | ORAL | 3 refills | Status: DC

## 2024-03-29 MED ORDER — RIVAROXABAN 20 MG PO TABS: 20.0000 mg | ORAL_TABLET | Freq: Every day | ORAL | 0 refills | Status: DC

## 2024-03-29 NOTE — Addendum Note (Signed)
 Addended by: Jermiah Howton on: 03/29/2024 05:05 PM   Modules accepted: Orders

## 2024-03-29 NOTE — Patient Instructions (Addendum)
 Medication Instructions:    START TAKING:  XARELTO  20 MG ONCE A DAY   STOP TAKING AND REMOVE THIS MEDICATION FROM YOUR MEDICATION LIST: ELIQUIS     *If you need a refill on your cardiac medications before your next appointment, please call your pharmacy*   Lab Work: NONE ORDERED  TODAY    If you have labs (blood work) drawn today and your tests are completely normal, you will receive your results only by: MyChart Message (if you have MyChart) OR A paper copy in the mail If you have any lab test that is abnormal or we need to change your treatment, we will call you to review the results.  Testing/Procedures: NONE ORDERED  TODAY     Follow-Up: At [facility], you and your health needs are our priority.  As part of our continuing mission to provide you with exceptional heart care, our providers are all part of one team.  This team includes your primary Cardiologist (physician) and Advanced Practice Providers or APPs (Physician Assistants and Nurse Practitioners) who all work together to provide you with the care you need, when you need it.  Your next appointment:    1 month(s) ( CONTACT  CASSIE HALL/ ANGELINE HAMMER FOR EP SCHEDULING ISSUES )   Provider:    You may see Ardeen Kohler, MD or one of the following Advanced Practice Providers on your designated Care Team:   Mertha Abrahams, [place]  We recommend signing up for the patient portal called "MyChart".  Sign up information is provided on this After Visit Summary.  MyChart is used to connect with patients for Virtual Visits (Telemedicine).  Patients are able to view lab/test results, encounter notes, upcoming appointments, etc.  Non-urgent messages can be sent to your provider as well.   To learn more about what you can do with MyChart, go to [email or web address].   Other Instructions:

## 2024-03-30 ENCOUNTER — Telehealth: Payer: Self-pay | Admitting: Physician Assistant

## 2024-03-30 NOTE — Telephone Encounter (Signed)
 Pt c/o medication issue:  1. Name of Medication:   rivaroxaban (XARELTO) 20 MG TABS tablet    2. How are you currently taking this medication (dosage and times per day)? As written   3. Are you having a reaction (difficulty breathing--STAT)? No   4. What is your medication issue? Pt called in stating she started this yesterday but her nose was bleeding bright red last night. She asked what are her other options. Please advise.

## 2024-03-30 NOTE — Telephone Encounter (Signed)
 Spoke with patient stated she started xarelto yesterday. She states she had a nose bleed this morning. She states it scared her and she will not take another one of those pills (Xarelto). She states she was told yesterday to call if she have any problems to discuss other options

## 2024-03-31 NOTE — Telephone Encounter (Signed)
 Spoke to patient she stated she wanted to try a different blood thinner.Stated [name] mentioned at last office.Appointment was offered but,stated she did not need appointment.Stated she does not drive and has trouble with transportation.Advised I will send message to Mertha Abrahams PA for advice.

## 2024-03-31 NOTE — Telephone Encounter (Signed)
Patient is calling to follow up. Please advise.

## 2024-04-01 NOTE — Telephone Encounter (Signed)
 Left message for patient to call back

## 2024-04-01 NOTE — Telephone Encounter (Signed)
 Spoke with patient and shared Renee's response:   Xarelto  was the only alternative medication that we spoke of to any degree. There is warfarin that we can consider though that would be really hard for her, requiring regular lab testing and dose adjustments.  Given her transportation difficulties especially.  We can do a tele virtual/telephone visit if she would like to discuss alternative management strategies. That way she would not have to worry about getting a ride in.  Renee    Scheduled telephone OV appt for 5/9 with Mertha Abrahams, PA-C to discuss alternative management.

## 2024-04-01 NOTE — Telephone Encounter (Signed)
 Pt returning call, requesting cb

## 2024-04-05 ENCOUNTER — Other Ambulatory Visit: Payer: Self-pay

## 2024-04-05 MED ORDER — ROSUVASTATIN CALCIUM 20 MG PO TABS: 20.0000 mg | ORAL_TABLET | Freq: Every day | ORAL | 3 refills | Status: DC

## 2024-04-08 ENCOUNTER — Ambulatory Visit: Attending: Physician Assistant | Admitting: Physician Assistant

## 2024-04-08 ENCOUNTER — Encounter: Payer: Self-pay | Admitting: Physician Assistant

## 2024-04-08 DIAGNOSIS — I480 Paroxysmal atrial fibrillation: Secondary | ICD-10-CM

## 2024-04-08 DIAGNOSIS — D6869 Other thrombophilia: Secondary | ICD-10-CM

## 2024-04-08 NOTE — Progress Notes (Signed)
 .     Virtual Visit via Telephone Note   Because of [name] co-morbid illnesses, she is at least at moderate risk for complications without adequate follow up.  This format is felt to be most appropriate for this patient at this time.  The patient did not have access to video technology/had technical difficulties with video requiring transitioning to audio format only (telephone).  All issues noted in this document were discussed and addressed.  No physical exam could be performed with this format.  Please refer to the patient's chart for her consent to telehealth for [facility].  The patient was identified using 2 identifiers.    Date:  04/08/2024   ID:  [name], DOB Apr 20, 1941, MRN [id number] The patient was identified using 2 identifiers.  Patient Location: Home Provider Location: Office/Clinic  Cardiology Office Note:  .   Date:  04/08/2024  ID:  [name], DOB 1940-12-02, MRN [id number] PCP: Rae Bugler, MD  [facility] Providers Cardiologist:  Oneil Bigness, MD Electrophysiologist:  Ardeen Kohler, MD {  History of Present Illness: .   [name] is a 83 y.o. female w/PMHx of  HTN, HLD, DM AFib  Dec monitor Impression: SVT was present but some appears to be atrial fibrillation.  Occasional PVCs (3.2% burden).   She saw Dr. Elodia Hailstone 01/03/24, palpitations improved on BB, though ongoing daily symptoms of AFib and started on Multaq , referred to EP. Also c/o some CP and planned for coronary CT  Saw Dr. Daneil Dunker 01/11/24, at this time she had not yet started the Multaq  and was advised to go ahead and start. Pending her CT, if no significant CAD could consider Flecainide if Multaq  failed Discussed that despite paroxysmal nature and relatively short duration of episodes, patient reports constant daily fatigue. This appears to be out of proportion to her burden of atrial fibrillation.  Planned to repeat monitor on AAD and try to better correlate  symptoms to arrhythmia  April 2025 monitor HR 55 - 156, average 71 bpm. 1 nonsustained SVT (longest 5 beats) and 1 nonsustained VT (longest 4 beats). No atrial fibrillation detected. Rare supraventricular ectopy. Rare ventricular ectopy. No sustained arrhythmias. Symptom trigger episodes correspond to sinus rhythm with ectopy. And ectopy overall was rare   I saw her 03/29/24 She comes today accompanied by a dear friend that she considers a sister.  She just doesn't feel well, since "all of this started", seems to be referring to AFib/rhythm abnormalities, but does say that since she is on the two heart pills feels slight improved. She is happy to hear that her heart rhythm on the f/u monitor was improved Symptoms are hard to get a hold of She never feels well, she has some days that she feels less bad, but never good. While here she seems to wince/grimace as if in pain, but denies any, says it is just how she feels, "bad" Not lightheaded, not in pain, just feels weak and tired  Very tired, no energy, says she used to be able to "go-go-go" all day and can hardly do anything now for months and months Her PMD did labs last week, unknown results  No CP, not SOB She reports she sleeps well Does have a lot of personal worries her older sister and a nephew with cancer, neither with good prognosis and these are the last of her blood family, worries about them a lot  She started to have daily nose bleeds with the Eliquis , none further  since stopping it Required holding pressure/using cold compress to get them to stop  Recommended: ENT evaluation though she felt that would be a waste of time and not agreeable to pursue that Reluctantly after discussion of role of OAC and her AFib/strroke risk to try Xarelto  Seemed otherwise symptoms better if only slightly with her BB/Multaq  regime and continued Unclear though how much if any her very low AF burden played in her day-in day-out symptoms D/w  Dr. Daneil Dunker > would be reasonable as a next plan to place loop and stop OAC for arrhythmia burden and medication management  03/30/24: she called with nose bleeding after a single dose of Xarelto  Mentioned visits and coming in was difficult relying on friends/family rides  Given that I did not thin warfarin would be a good option, and offered a virtual visit to discuss loop implant   Today's visit is scheduled as to discuss a/c options and management startegies ROS:   She reports that after a single dose of Xarelto  her nose bled, reports not a small amount of blood, and is scary. She continues to say that with the metoprolol  and multaq  she does feel a bit better  She does occasionally feel a little lightheaded or off balance, sometimes upon standing pretty strong dizziness, but settles No syncope  She is concerned about not being on a blood thinner, agrees that with her transportation restrictions/difficulties, she would not be able to do warfarin  Arrhythmia/AAD hx Multaq  started Feb 2025  Studies Reviewed: Aaron Aas    EKG not done today  02/18/24: coronary CTa IMPRESSION: 1. Coronary calcium  score of 426.   2. Total plaque volume (TPV) 398 mm3 which is 47 percentile for age-and sex matched controls (calcified plaque 87 mm3; non-calcified plaque 311 mm3). TPV is moderate.   3. Normal coronary origin with right dominance.   4. Moderate non obstructive CAD, FFR LAD 0.8 .   TTE 11/05/2023  1. Left ventricular ejection fraction, by estimation, is 60 to 65%. The  left ventricle has normal function. The left ventricle has no regional  wall motion abnormalities. Left ventricular diastolic parameters are  consistent with Grade I diastolic  dysfunction (impaired relaxation).   2. Right ventricular systolic function is normal. The right ventricular  size is normal. Tricuspid regurgitation signal is inadequate for assessing  PA pressure.   3. The mitral valve is degenerative. No evidence of  mitral valve  regurgitation. Moderate mitral annular calcification.   4. The aortic valve is grossly normal. Aortic valve regurgitation is not  visualized.   5. The inferior vena cava is normal in size with greater than 50%  respiratory variability, suggesting right atrial pressure of 3 mmHg.    NM Stress 10/12/2023   Findings are consistent with no ischemia. The study is low risk.   No ST deviation was noted. The ECG was not diagnostic due to pharmacologic protocol.   LV perfusion is abnormal. Defect 1: There is a large defect with moderate reduction in uptake present in the apical to basal inferior, inferolateral, inferoseptal and apex location(s) that is fixed. There is normal wall motion in the defect area.   Left ventricular function is abnormal. Global function is mildly reduced. There were no regional wall motion abnormalities. Nuclear stress EF: 53%. The left ventricular ejection fraction is mildly decreased (45-54%). End diastolic cavity size is normal. End systolic cavity size is normal.   Prior study not available for comparison.   Risk Assessment/Calculations:    Physical Exam:  Objective:    Vital Signs:  none provided by the patient   ASSESSMENT AND PLAN: .    paroxysmal AFib CHA2DS2Vasc is 5 No AFib on her f/u monitor  She has significant nose bleeds wioth eliquis  and xarelto  She does not want to see an ENT, says she has never had bloody noses before until the medication  In d/w Dr. Daneil Dunker after her last visit, felt loop implant and off OAC would be a strategy we could pursue We discussed this at length today The loop monitor, implant procxedure, potetial risks/benefots an monthly monitoring > billing to her insurance.  She would like to proceed. She has a visit with dr. Daneil Dunker 5/30 already in place, will ask our team to send for prior authorization and see if we can get her implanted at that visit   SVT 1 nonsustained SVT (longest 5 beats) and 1  nonsustained VT (longest 4 beats).   HTN Not addressed today formally Did advise she stay hydrated well and monitor her BP at home with some orthostatic symptoms  Secondary hypercoagulable state 2/2 AFib    Time:   Today, I have spent 23 minutes with the patient with telehealth technology discussing the above problems.     Dispo: as above  Signed, Debbie Fails, PA-C

## 2024-04-19 ENCOUNTER — Telehealth: Payer: Self-pay | Admitting: Cardiovascular Disease

## 2024-04-19 NOTE — Telephone Encounter (Signed)
 Spoke with pt who states her PCP is managing her Rosuvastatin .  Pt states she only takes 5mg  daily and she is uncertain as to why this medication was sent to the pharmacy by Dr Rolm Clos.  Pt advised medication refill was most likely requested by pharmacy.  Pt states pharmacy has advised her she has Rosuvastatin  5mg  tablets ready for pick up from her PCP.   Will forward to make Dr Rolm Clos aware.  Pt verbalizes understanding and thanked Charity fundraiser for the call.

## 2024-04-19 NOTE — Telephone Encounter (Signed)
 Pt c/o medication issue:  1. Name of Medication: rosuvastatin  (CRESTOR ) 20 MG tablet   2. How are you currently taking this medication (dosage and times per day)?   Take 1 tablet (20 mg total) by mouth daily.    3. Are you having a reaction (difficulty breathing--STAT)? No  4. What is your medication issue? Patient is calling because we sent a refill for this medication with 20 MG. Patient stated she is unable to take the 20 MG and can only take the 5 MG for this medication. Patient stated Dr. Janifer Meigs is the provider that has been managing this medication. Please advise.

## 2024-04-26 ENCOUNTER — Telehealth: Payer: Self-pay | Admitting: Physician Assistant

## 2024-04-26 NOTE — Telephone Encounter (Signed)
 Spoke with the patient who reports being nervous about upcoming appointment for a loop recorder implant. Educated patient on the procedure and what the loop recorder is used for. Patient verbalized understanding.

## 2024-04-26 NOTE — Telephone Encounter (Signed)
 Pt has several questions about her Loop Implant and also the prior auth.

## 2024-04-27 NOTE — Telephone Encounter (Signed)
 Pt has conflicting times for when she is suppose to be here for appt. Pt would like a c/b to verfify

## 2024-04-27 NOTE — Telephone Encounter (Signed)
 Spent 15 min on phone w/ pt.  Pt advised to arrive at 1:30 on Friday for her appt, she is agreeable to plan.

## 2024-04-29 ENCOUNTER — Encounter: Payer: Self-pay | Admitting: Cardiology

## 2024-04-29 ENCOUNTER — Ambulatory Visit: Attending: Cardiology | Admitting: Cardiology

## 2024-04-29 VITALS — BP 144/75 | HR 79 | Ht 61.0 in | Wt 144.0 lb

## 2024-04-29 DIAGNOSIS — D6869 Other thrombophilia: Secondary | ICD-10-CM | POA: Diagnosis not present

## 2024-04-29 DIAGNOSIS — R002 Palpitations: Secondary | ICD-10-CM | POA: Diagnosis not present

## 2024-04-29 DIAGNOSIS — I471 Supraventricular tachycardia, unspecified: Secondary | ICD-10-CM

## 2024-04-29 DIAGNOSIS — I10 Essential (primary) hypertension: Secondary | ICD-10-CM

## 2024-04-29 DIAGNOSIS — I480 Paroxysmal atrial fibrillation: Secondary | ICD-10-CM

## 2024-04-29 NOTE — Progress Notes (Signed)
 Electrophysiology Office Note:   Date:  04/29/2024  ID:  [name], DOB [birth year], MRN [id number]  Primary Cardiologist: Oneil Bigness, MD Electrophysiologist: Ardeen Kohler, MD      History of Present Illness:   [name] is a 83 y.o. female with h/o hypertension, hyperlipidemia, paroxysmal atrial fibrillation who is being seen today for loop recorder implant.  Discussed the use of AI scribe software for clinical note transcription with the patient, who gave verbal consent to proceed.  History of Present Illness An 83 year old female with atrial fibrillation who presents for the insertion of a cardiac monitor to evaluate episodes of dizziness and weakness. She experiences episodes of dizziness and a general feeling of being unwell, which are not clearly correlated with her known atrial fibrillation. She is currently not on any blood thinners.  Review of systems complete and found to be negative unless listed in HPI.   EP Information / Studies Reviewed:    EKG is not ordered today. EKG from 03/29/24 reviewed which showed sinus rhythm with first degree AV block.      Echo 11/05/23:  1. Left ventricular ejection fraction, by estimation, is 60 to 65%. The  left ventricle has normal function. The left ventricle has no regional  wall motion abnormalities. Left ventricular diastolic parameters are  consistent with Grade I diastolic  dysfunction (impaired relaxation).   2. Right ventricular systolic function is normal. The right ventricular  size is normal. Tricuspid regurgitation signal is inadequate for assessing  PA pressure.   3. The mitral valve is degenerative. No evidence of mitral valve  regurgitation. Moderate mitral annular calcification.   4. The aortic valve is grossly normal. Aortic valve regurgitation is not  visualized.   5. The inferior vena cava is normal in size with greater than 50%  respiratory variability, suggesting right atrial pressure of 3 mmHg.     Nuclear Stress 10/2023:   Findings are consistent with no ischemia. The study is low risk.   No ST deviation was noted. The ECG was not diagnostic due to pharmacologic protocol.   LV perfusion is abnormal. Defect 1: There is a large defect with moderate reduction in uptake present in the apical to basal inferior, inferolateral, inferoseptal and apex location(s) that is fixed. There is normal wall motion in the defect area.   Left ventricular function is abnormal. Global function is mildly reduced. There were no regional wall motion abnormalities. Nuclear stress EF: 53%. The left ventricular ejection fraction is mildly decreased (45-54%). End diastolic cavity size is normal. End systolic cavity size is normal.   Prior study not available for comparison.   Zio 11/05/23: Patch Wear Time:  13 days and 21 hours (2024-11-11T13:25:05-499 to 2024-11-25T10:37:20-0500)   Patient had a min HR of 55 bpm (sinus bradycardia), max HR of 272 bpm (supraventricular tachycardia), and avg HR of 80 bpm (normal sinus rhythm). Predominant underlying rhythm was Sinus Rhythm. 33 non-sustained Ventricular Tachycardia runs occurred, the run with the fastest interval lasting 11 beats (4.4 second duration) with a max rate of 190 bpm (avg 176 bpm); the run with the fastest interval was also the longest. Episodes of Ventricular Tachycardia may be Supraventricular Tachycardia with possible aberrancy. 614 Supraventricular Tachycardia runs occurred, the run with the fastest interval lasting 14 beats (4.1 second duration) with a max rate of 272 bpm, the longest lasting 20 mins 49 secs with an avg rate of 174 bpm. Some episodes of Supraventricular Tachycardia appear to be atrial fibrillation. Isolated SVEs were rare (<  1.0%), SVE Couplets were rare (<1.0%), and SVE Triplets were rare (<1.0%). Isolated VEs were occasional (3.2%, 44742), VE Couplets were rare (<1.0%, 2671), and VE Triplets were rare (<1.0%, 132). Ventricular Bigeminy and  Trigeminy were present.    Impression: SVT was present but some appears to be atrial fibrillation.  Occasional PVCs (3.2% burden).   Risk Assessment/Calculations:    CHA2DS2-VASc Score = 5   This indicates a 7.2% annual risk of stroke. The patient's score is based upon: CHF History: 0 HTN History: 1 Diabetes History: 1 Stroke History: 0 Vascular Disease History: 0 Age Score: 2 Gender Score: 1     Physical Exam:   VS:  BP (!) 144/75   Pulse 79   Ht 5\' 1"  (1.549 m)   Wt 144 lb (65.3 kg)   SpO2 97%   BMI 27.21 kg/m    Wt Readings from Last 3 Encounters:  04/29/24 144 lb (65.3 kg)  03/29/24 150 lb 12.8 oz (68.4 kg)  02/25/24 148 lb (67.1 kg)     GEN: Well nourished, well developed in no acute distress NECK: No JVD CARDIAC: Normal rate, regular rhythm RESPIRATORY:  Clear to auscultation without rales, wheezing or rhonchi  ABDOMEN: Soft, non-distended EXTREMITIES:  No edema; No deformity   ASSESSMENT AND PLAN:    #Paroxysmal atrial fibrillation: Patient had short paroxysms of likely AF on Zio monitor, longest lasting 20 minutes. Despite paroxysmal nature and relatively short duration of episodes, patient reports constant daily fatigue. This appears to be out of proportion to her burden of atrial fibrillation. We discussed treatment options for AF. Before pursuing more aggressive measures, would like to prove correlation of AF with her symptoms. We will trial AAD therapy for now then repeat Zio monitor to see if decrease in burden results in an improvement in symptoms.  #Secondary hypercoagulable state due to atrial fibrillation: CHADSVASC score of 5. - Continue dronedarone  400mg  BID. - Continue metoprolol  XL 25mg  daily. - Patient is off Eliquis  due to severe nosebleeds. Will monitor for recurrence with loop recorder.     #. SVT: Zio reported frequent episodes of SVT. Some are likely AF as mentioned above. Some are most likely bursts of atrial tachycardia. Cannot r/o other  paroxysmal SVTs.  #. Palpitations: - Continue metoprolol  and dronedarone  as above.  - Loop recorder as above.    #Hypertension -Above goal today.  Recommend checking blood pressures 1-2 times per week at home and recording the values.  Recommend bringing these recordings to the primary care physician.  SURGEON:  Ardeen Kohler, MD     PREPROCEDURE DIAGNOSIS:  Atrial fibrillation    POSTPROCEDURE DIAGNOSIS: Atrial fibrillation     PROCEDURES:   1. Implantable loop recorder implantation    INTRODUCTION:  Alorah Mcree presents with a history of atrial fibrillation The costs of loop recorder monitoring have been discussed with the patient.    DESCRIPTION OF PROCEDURE:  Informed written consent was obtained.   Time Out Completed with RN    The patient required no sedation for the procedure today.  Mapping over the patient's chest was performed to identify the area where electrograms were most prominent for ILR recording.  This area was found to be the left parasternal region over the 4th intercostal space. The patients left chest was therefore prepped and draped in the usual sterile fashion. The skin overlying the left parasternal region was infiltrated with lidocaine  for local analgesia.  A 0.5-cm incision was made over the left parasternal region over the  3rd intercostal space.  A subcutaneous ILR pocket was fashioned using a combination of sharp and blunt dissection.  A [employer] LUX implantable loop recorder (serial # [id number]) was then placed into the pocket  R waves were very prominent and measuring 0.70mV.  Steri- Strips and a sterile dressing were then applied.  There were no early apparent complications.     CONCLUSIONS:   1. Successful implantation of a implantable loop recorder for a history of atrial fibrillation.  2. No early apparent complications.   Signed, Ardeen Kohler, MD

## 2024-04-29 NOTE — Patient Instructions (Signed)
 Medication Instructions:  Your physician recommends that you continue on your current medications as directed. Please refer to the Current Medication list given to you today.  Labwork: None ordered.  Testing/Procedures: None ordered.  Follow-Up:  Your physician wants you to follow-up in 6 months with Mertha Abrahams, PA.  You will receive a reminder letter in the mail two months in advance. If you don't receive a letter, please call our office to schedule the follow-up appointment.    Implantable Loop Recorder Removal, Care After This sheet gives you information about how to care for yourself after your procedure. Your health care provider may also give you more specific instructions. If you have problems or questions, contact your health care provider. What can I expect after the procedure? After the procedure, it is common to have: Soreness or discomfort near the incision. Some swelling or bruising near the incision.  Follow these instructions at home: Incision care  Monitor your cardiac device site for redness, swelling, and drainage. Call the device clinic at [phone] if you experience these symptoms or fever/chills.  Keep the large square bandage on your site for 24 hours and then you may remove it yourself. Keep the steri-strips underneath in place.   You may shower after 72 hours / 3 days from your procedure with the steri-strips in place. They will usually fall off on their own, or may be removed after 10 days. Pat dry.   Avoid lotions, ointments, or perfumes over your incision until it is well-healed.  Please do not submerge in water until your site is completely healed.   If your wound site starts to bleed apply pressure.       If you have any questions/concerns please call the device clinic at [phone].  Activity  Return to your normal activities.  Contact a health care provider if: You have redness, swelling, or pain around your incision. You have a  fever.

## 2024-05-12 ENCOUNTER — Telehealth: Payer: Self-pay | Admitting: Cardiology

## 2024-05-12 NOTE — Telephone Encounter (Signed)
 Spoke with pt, aware after 10 days she can remove the steri strips. She reports it hurts if she tries to get it off. She wants to know what she can use to get it off. Aware I will have someone from the device clinic to call her back.

## 2024-05-12 NOTE — Telephone Encounter (Signed)
 Patient states she is not able to remove steri-strips due to feeling squeamish. Device Clinic apt made 05/13/24 @ 11:00 am. Pt aware of location, date and time.

## 2024-05-12 NOTE — Telephone Encounter (Signed)
 Pt called in concerned that her sterile strip from loop recorder implant hasn't come off. Please advise.

## 2024-05-13 ENCOUNTER — Ambulatory Visit

## 2024-05-13 NOTE — Telephone Encounter (Signed)
 Patient called, was able to remove steri-strips. Patient advised to call if she has nay further questions or concerns. Pt voiced understanding.

## 2024-05-13 NOTE — Telephone Encounter (Signed)
 Patient stated she was able to remove the strips by herself last night. Patient would like to know if she should apply any cream/ointment. Please advise.

## 2024-05-19 ENCOUNTER — Ambulatory Visit: Payer: Self-pay

## 2024-05-20 NOTE — Telephone Encounter (Signed)
 Pt returning nurse call in regards to results. Please advise

## 2024-05-26 DIAGNOSIS — M25511 Pain in right shoulder: Secondary | ICD-10-CM | POA: Diagnosis not present

## 2024-05-26 DIAGNOSIS — M25512 Pain in left shoulder: Secondary | ICD-10-CM | POA: Diagnosis not present

## 2024-05-26 DIAGNOSIS — M791 Myalgia, unspecified site: Secondary | ICD-10-CM | POA: Diagnosis not present

## 2024-05-26 DIAGNOSIS — M542 Cervicalgia: Secondary | ICD-10-CM | POA: Diagnosis not present

## 2024-05-30 ENCOUNTER — Ambulatory Visit

## 2024-05-30 DIAGNOSIS — I471 Supraventricular tachycardia, unspecified: Secondary | ICD-10-CM

## 2024-05-30 LAB — CUP PACEART REMOTE DEVICE CHECK
Date Time Interrogation Session: 20250630001700
Implantable Pulse Generator Implant Date: 20250530
Pulse Gen Serial Number: 136161

## 2024-06-01 ENCOUNTER — Ambulatory Visit: Payer: Self-pay | Admitting: Cardiology

## 2024-06-06 ENCOUNTER — Ambulatory Visit: Admitting: Internal Medicine

## 2024-06-22 ENCOUNTER — Telehealth: Payer: Self-pay | Admitting: Internal Medicine

## 2024-06-22 DIAGNOSIS — E119 Type 2 diabetes mellitus without complications: Secondary | ICD-10-CM

## 2024-06-22 MED ORDER — ACCU-CHEK FASTCLIX LANCETS MISC
12 refills | Status: AC
Start: 2024-06-22 — End: ?

## 2024-06-22 MED ORDER — ACCU-CHEK GUIDE TEST VI STRP: ORAL_STRIP | 12 refills | Status: AC

## 2024-06-22 NOTE — Telephone Encounter (Signed)
 Patient left a message on the call service during lunch.She needs her Accu-check test strips called in as she does not have any.The lantus  was called in but not the test strips.Please send to the [facility] the patient has on file.

## 2024-06-22 NOTE — Telephone Encounter (Signed)
 Requested Prescriptions   Signed Prescriptions Disp Refills   Accu-Chek FastClix Lancets MISC 300 each 12    Sig: Use to check blood sugar 3 times a day. DxCode:E11.9    Authorizing Provider: TRIXIE FILE    Ordering User: CLEOTILDE ROLIN RAMAN

## 2024-06-22 NOTE — Telephone Encounter (Signed)
 Patient called and stated she is out of her Accu-Chek FastClix Lancets.She also stated that her pharmacy has faxed over 3-4 for a refill without a response.[id number] - [place], [place] - [address] AT [facility] OF [address]  [address], [place] [phone]   Patient's contact info is [phone]

## 2024-06-30 ENCOUNTER — Ambulatory Visit (INDEPENDENT_AMBULATORY_CARE_PROVIDER_SITE_OTHER)

## 2024-06-30 DIAGNOSIS — I471 Supraventricular tachycardia, unspecified: Secondary | ICD-10-CM | POA: Diagnosis not present

## 2024-07-04 ENCOUNTER — Telehealth: Payer: Self-pay | Admitting: Cardiovascular Disease

## 2024-07-04 NOTE — Telephone Encounter (Signed)
 *  STAT* If patient is at the pharmacy, call can be transferred to refill team.   1. Which medications need to be refilled? (please list name of each medication and dose if known)   dronedarone  (MULTAQ ) 400 MG tablet    metoprolol  succinate (TOPROL  XL) 25 MG 24 hr tablet    2. Would you like to learn more about the convenience, safety, & potential cost savings by using the [facility]? No      3. Are you open to using the [facility] (Type [facility]. ). No    4. Which pharmacy/location (including street and city if local pharmacy) is medication to be sent to? [id number] - [place], [place] - [address] AT [facility] [address]     5. Do they need a 30 day or 90 day supply? 90 days

## 2024-07-06 LAB — CUP PACEART REMOTE DEVICE CHECK
Date Time Interrogation Session: 20250804002100
Implantable Pulse Generator Implant Date: 20250530
Pulse Gen Serial Number: 136161

## 2024-07-10 ENCOUNTER — Ambulatory Visit: Payer: Self-pay | Admitting: Cardiology

## 2024-07-12 ENCOUNTER — Telehealth: Payer: Self-pay | Admitting: Cardiovascular Disease

## 2024-07-12 ENCOUNTER — Other Ambulatory Visit: Payer: Self-pay

## 2024-07-12 ENCOUNTER — Other Ambulatory Visit: Payer: Self-pay | Admitting: Cardiovascular Disease

## 2024-07-12 MED ORDER — METOPROLOL SUCCINATE ER 25 MG PO TB24: 25.0000 mg | ORAL_TABLET | Freq: Every evening | ORAL | 1 refills | Status: DC

## 2024-07-12 MED ORDER — MULTAQ 400 MG PO TABS: 400.0000 mg | ORAL_TABLET | Freq: Two times a day (BID) | ORAL | 1 refills | Status: DC

## 2024-07-12 NOTE — Telephone Encounter (Signed)
 RX sent in

## 2024-07-12 NOTE — Telephone Encounter (Signed)
*  STAT* If patient is at the pharmacy, call can be transferred to refill team.   1. Which medications need to be refilled? (please list name of each medication and dose if known) dronedarone  (MULTAQ ) 400 MG tablet  metoprolol  succinate (TOPROL  XL) 25 MG 24 hr tablet    2. Would you like to learn more about the convenience, safety, & potential cost savings by using the [facility]?     3. Are you open to using the [facility] ([facility].  ).   4. Which pharmacy/location (including street and city if local pharmacy) is medication to be sent to? [id number] - [place], [place] - [address] AT [facility] OF [address]    5. Do they need a 30 day or 90 day supply? 90 day

## 2024-07-28 ENCOUNTER — Ambulatory Visit (INDEPENDENT_AMBULATORY_CARE_PROVIDER_SITE_OTHER): Admitting: Internal Medicine

## 2024-07-28 ENCOUNTER — Encounter: Payer: Self-pay | Admitting: Internal Medicine

## 2024-07-28 ENCOUNTER — Telehealth: Payer: Self-pay | Admitting: Cardiology

## 2024-07-28 VITALS — BP 124/70 | HR 74 | Ht 61.0 in | Wt 157.6 lb

## 2024-07-28 DIAGNOSIS — Z794 Long term (current) use of insulin: Secondary | ICD-10-CM | POA: Diagnosis not present

## 2024-07-28 DIAGNOSIS — E1165 Type 2 diabetes mellitus with hyperglycemia: Secondary | ICD-10-CM | POA: Diagnosis not present

## 2024-07-28 DIAGNOSIS — E039 Hypothyroidism, unspecified: Secondary | ICD-10-CM

## 2024-07-28 DIAGNOSIS — Z7984 Long term (current) use of oral hypoglycemic drugs: Secondary | ICD-10-CM

## 2024-07-28 DIAGNOSIS — E042 Nontoxic multinodular goiter: Secondary | ICD-10-CM | POA: Diagnosis not present

## 2024-07-28 LAB — POCT GLYCOSYLATED HEMOGLOBIN (HGB A1C): Hemoglobin A1C: 8.2 % — AB (ref 4.0–5.6)

## 2024-07-28 NOTE — Progress Notes (Signed)
 Patient ID: [name], female   DOB: 1941-10-10, 83 y.o.   MRN: [id number]  HPI: [name] is a 83 y.o.-year-old female, returning for follow-up for DM2, dx in 1993, non-insulin -dependent, uncontrolled, with complications (PN). Pt. previously saw Dr. Kassie, but last visit with me 5 months ago.    Interim history: No blurry vision, chest pain.  She does have increased urination.  He has to wear depends. Before last visit, she described significantly increased blood sugars, in the 300s, after a CT scan obtained in 01/2024.  We added mealtime insulin  and sugars improved.  Reviewed HbA1c: Lab Results  Component Value Date   HGBA1C 7.7 (A) 02/25/2024   HGBA1C 8.2 (H) 10/02/2023   HGBA1C 6.8 06/09/2023   HGBA1C 9.8 (A) 01/20/2023   HGBA1C 6.2 (A) 08/12/2022   HGBA1C 7.0 (A) 04/07/2022   HGBA1C 7.9 (A) 02/18/2022   HGBA1C 9.6 (A) 12/17/2021   HGBA1C 8.3 (A) 10/11/2021   HGBA1C 7.0 (A) 04/25/2021   Pt is on a regimen of: - Metformin  ER 500 mg 2x daily - Acarbose  25 mg 3x a day - Rybelsus  14 mg in am - Humalog  (restarted 01/2024) 5-6 >> 8-10 units before meals  She tried bromocriptine  >> glycemia. She tried Invokana  >> dizziness. She tried Farxiga  >> hypotension. She tried pioglitazone  >> edema. She was previously on Prandin , but stopped 01/2024 after starting Humalog .  Pt checks her sugars 0-1x a day: - am: 135-230, 286 >> 300-390 after aCT scan, then decreased to 200s >> 158-180, 213 - 2h after b'fast: n/c >> 158, 228 - before lunch: 119-178, 273 >> 116-163 >> 116-163 >> n/c >> 100-226, 260 - 2h after lunch: n/c - before dinner: 102-177, 201-263 (steroid) >> 152-275, 309 >> 200s >> 97-220, 257 - 2h after dinner: n/c - bedtime: n/c>> 116-184>> 127-180 >> 137-265 >> n/c >> 164 - nighttime: n/c Lowest sugar was 97 >> 135 >> 140; she has hypoglycemia awareness at 70.  Highest sugar was 309 >> 390  Glucometer: Accu Check guide  Pt's meals are: - Breakfast: egg and toast;  sometimes oatmeal, sometimes grits - Lunch: pimento or bologna cheese sandwich - Dinner: TV dinner - Snacks: sugar free jello, mints  - no CKD, last BUN/creatinine:  03/22/2024:  Lab Results  Component Value Date   BUN 16 02/15/2024   BUN 8 01/07/2024   CREATININE 0.89 02/15/2024   CREATININE 0.65 01/07/2024  09/17/2023: 23/0.68, GFR 75, ACR 1.47  No results found for: MICRALBCREAT She is is on Benicar 5 mg daily.  - + HL; last set of lipids:  09/17/2023: 183/202/61/89 No results found for: CHOL, HDL, LDLCALC, LDLDIRECT, TRIG, CHOLHDL She is on Crestor  5 mg daily.  - last eye exam was several years ago. No DR.   - no numbness and tingling in her feet.  On gabapentin  300 mg 3 times a day.  Last foot exam 10/14/2023.  Hypothyroidism:  Pt is on levothyroxine  50 mcg daily, taken: - in am - fasting - immediately from b'fast -+ calcium  in the pm - no iron - no multivitamins - no PPIs, + Pepcid  in am >> pm - not on Biotin  Reviewed TSH levels: Lab Results  Component Value Date   TSH 2.170 10/02/2023   TSH 1.59 09/29/2022   TSH 4.34 12/17/2021   TSH 2.21 04/25/2021  08/18/2022: TSH 5.43 (0.34-4.5)  Thyroid  nodules:  Reviewed the report of her Thyroid  U/S (09/06/2021): Parenchymal Echotexture: Mildly heterogeneous  Isthmus: 0.3 cm  Right lobe: 3.0  x 1.3 x 1.5 cm  Left lobe: 5.3 x 2.3 x 2.3 cm  _________________________________________________________   Nodule # 1:  Location: Right; inferior  Maximum size: 1.2 cm; Other 2 dimensions: 0.8 x 0.9 cm  Composition: solid/almost completely solid (2)  Echogenicity: isoechoic (1)  Given size (<1.4 cm) and appearance, this nodule does NOT meet TI-RADS criteria for biopsy or dedicated follow-up.  _________________________________________________________   Nodule # 2:  Location: Left; mid  Maximum size: 2.1 cm; Other 2 dimensions: 1.1 x 1.5 cm  Composition: solid/almost completely solid (2)   Echogenicity: hypoechoic (2)  **Given size (>/= 1.5 cm) and appearance, fine needle aspiration of this moderately suspicious nodule should be considered based on TI-RADS criteria.  _________________________________________________________   Nodule # 3:  Location: Left; inferior  Maximum size: 2.5 cm; Other 2 dimensions: 2.0 x 2.4 cm  Composition: solid/almost completely solid (2)  Echogenicity: isoechoic (1) **Given size (>/= 2.5 cm) and appearance, fine needle aspiration of this mildly suspicious nodule should be considered based on TI-RADS criteria.  _________________________________________________________   IMPRESSION: 1. Nodule 2 (TI-RADS 4) located in the mid left thyroid  lobe meets criteria for FNA. 2. Nodule 3 (TI-RADS 3) located in the inferior left thyroid  lobe meets criteria for FNA.  FNA of the 2 nodules (10/02/2021): Clinical History: Left mid 2.1cm; Other 2 dimensions: 1.1 x 1.5cm, Solid  / almost completely solid, Hypoechoic, TI-RADS total points 4  Specimen Submitted:  A. THYROID , LEFT MID, FINE NEEDLE ASPIRATION:  FINAL MICROSCOPIC DIAGNOSIS:  - Follicular lesion of undetermined significance (Bethesda category III)  SPECIMEN ADEQUACY:  Satisfactory for evaluation   Afirma: benign  Clinical History: Left inferior 2.5cm; Other 2 dimensions:2.0 x 2.4cm,  Solid / almost completely solid, Isoechoic, TI-RADS total 3  Specimen Submitted:  A. THYROID , LEFT INFERIOR, FINE NEEDLE ASPIRATION:  FINAL MICROSCOPIC DIAGNOSIS:  - Atypia of undetermined significance (Bethesda category III)  SPECIMEN ADEQUACY:  Satisfactory for evaluation   Afirma: benign  Thyroid  U/S (10/21/2023): Parenchymal Echotexture: Mildly heterogenous  Isthmus: 0.3 cm  Right lobe: 2.9 x 1.4 x 1.1 cm  Left lobe: 4.1 x 1.3 x 1.2 cm  _________________________________________________________   Estimated total number of nodules >/= 1 cm:  3 _________________________________________________________   Nodule # 1: Small isoechoic solid nodule in the right lower gland remains stable at 1.2 cm. Given size (<1.4 cm) and appearance, this nodule does NOT meet TI-RADS criteria for biopsy or dedicated follow-up.   Nodules # 2 and # 3: Small subcentimeter nodules in the left upper and mid gland. No further follow-up.   Nodule # 4: Previously biopsied ill-defined nodule in the left mid gland is unchanged at 2.0 x 1.6 x 0.7 cm.   Nodule # 5: Previously biopsied nodule in the left inferior gland measures slightly smaller at 2.2 x 2.2 x 1.8 cm.   IMPRESSION: 1. Decreasing size of previously biopsied nodule # 5 in the left inferior gland. Involution over time is consistent with benignity. Recommend correlation with prior biopsy results. 2. No interval change in the size or appearance of the previously biopsied nodule # 4 in the left mid gland. 3. Additional small nodules are again identified. As before, these do not meet criteria to recommend biopsy or for imaging surveillance. No follow-up imaging recommended.  She also has a history of HTN, depression/anxiety, osteoarthritis.  ROS: + see HPI  Past Medical History:  Diagnosis Date   Anxiety    Arthritis    Cancer (HCC)    Depression  Diabetes mellitus without complication (HCC)    Hypertension    Hypothyroidism    Past Surgical History:  Procedure Laterality Date   ABDOMINAL HYSTERECTOMY     BACK SURGERY  12/01/2004   EXCISION MASS LOWER EXTREMETIES Left 07/11/2019   Procedure: EXCISION MASS foot;  Surgeon: Kay Kemps, MD;  Location: [facility];  Service: Orthopedics;  Laterality: Left;   GALLBLADDER SURGERY  12/02/2003   HERNIA REPAIR     KNEE SURGERY  2001/2005   REVERSE SHOULDER ARTHROPLASTY Left 04/18/2022   Procedure: REVERSE SHOULDER ARTHROPLASTY;  Surgeon: Kay Kemps, MD;  Location: [facility];  Service: Orthopedics;  Laterality: Left;   with ISB   Social History   Socioeconomic History   Marital status: Single    Spouse name: Not on file   Number of children: 4   Years of education: Not on file   Highest education level: Not on file  Occupational History   Occupation: Retired  Tobacco Use   Smoking status: Former    Current packs/day: 0.00    Average packs/day: 0.1 packs/day for 3.0 years (0.2 ttl pk-yrs)    Types: Cigarettes    Start date: 11    Quit date: 1980    Years since quitting: 45.6   Smokeless tobacco: Never  Vaping Use   Vaping status: Never Used  Substance and Sexual Activity   Alcohol use: Never   Drug use: Never   Sexual activity: Not Currently  Other Topics Concern   Not on file  Social History Narrative   ** Merged History Encounter **       Social Drivers of Corporate investment banker Strain: Not on file  Food Insecurity: Not on file  Transportation Needs: Not on file  Physical Activity: Not on file  Stress: Not on file  Social Connections: Not on file  Intimate Partner Violence: Not on file   Current Outpatient Medications on File Prior to Visit  Medication Sig Dispense Refill   acarbose  (PRECOSE ) 25 MG tablet TAKE 1 TABLET BY MOUTH THREE TIMES DAILY WITH MEALS 270 tablet 3   Accu-Chek FastClix Lancets MISC Use to check blood sugar 3 times a day. DxCode:E11.9 300 each 12   acetaminophen  (TYLENOL ) 325 MG tablet Take 500 mg by mouth every 6 (six) hours as needed for pain.     alprazolam  (XANAX ) 2 MG tablet Take 2 mg by mouth 2 (two) times daily.     Calcium  Citrate-Vitamin D  (CALCIUM  CITRATE + D PO) Take 1 tablet by mouth daily.     cholecalciferol  (VITAMIN D3) 25 MCG (1000 UNIT) tablet Take 2,000 Units by mouth daily.     cyanocobalamin  (VITAMIN B12) 500 MCG tablet Take 500 mcg by mouth as needed.     diclofenac  sodium (VOLTAREN ) 1 % GEL Apply 4 g topically 4 (four) times daily as needed (LEG PAIN).     dronedarone  (MULTAQ ) 400 MG tablet Take 1 tablet (400 mg total) by mouth 2  (two) times daily with a meal. 180 tablet 1   famotidine  (PEPCID ) 20 MG tablet Take 20 mg by mouth daily.     gabapentin  (NEURONTIN ) 300 MG capsule Take 300 mg by mouth 3 (three) times daily.     glucose blood (ACCU-CHEK GUIDE TEST) test strip Use to check blood sugar 3 times a day. DxCode:E11.9 300 each 12   HUMALOG  KWIKPEN 100 UNIT/ML KwikPen ADMINISTER 5 TO 6 UNITS UNDER THE SKIN THREE TIMES DAILY 15 mL 11   Insulin   Pen Needle 32G X 4 MM MISC Use 3x a day before meals 300 each 5   levothyroxine  (SYNTHROID ) 50 MCG tablet Take 1 tablet (50 mcg total) by mouth daily before breakfast. 90 tablet 3   metFORMIN  (GLUCOPHAGE -XR) 500 MG 24 hr tablet Take 1 tablet (500 mg total) by mouth in the morning and at bedtime. 180 tablet 3   methocarbamol  (ROBAXIN ) 500 MG tablet Take 1 tablet (500 mg total) by mouth every 8 (eight) hours as needed for muscle spasms. 60 tablet 1   metoprolol  succinate (TOPROL  XL) 25 MG 24 hr tablet Take 1 tablet (25 mg total) by mouth at bedtime. 90 tablet 1   olmesartan (BENICAR) 5 MG tablet Take 5 mg by mouth daily.     promethazine  (PHENERGAN ) 25 MG tablet Take 25 mg by mouth every 6 (six) hours as needed for nausea or vomiting.     rosuvastatin  (CRESTOR ) 5 MG tablet Take 5 mg by mouth daily.     Semaglutide  (RYBELSUS ) 14 MG TABS TAKE 1 TABLET(14 MG) BY MOUTH DAILY 90 tablet 1   traMADol (ULTRAM) 50 MG tablet Take 50 mg by mouth every 6 (six) hours as needed.     No current facility-administered medications on file prior to visit.   Allergies  Allergen Reactions   Aspirin Other (See Comments)    Pt states her PCP told her not to take aspirin but she can not remember exactly why.   Codeine Nausea And Vomiting   Hydrocodone Nausea And Vomiting   Latex Itching   Morphine And Codeine Nausea And Vomiting   Darvon [Propoxyphene] Nausea And Vomiting   Family History  Problem Relation Age of Onset   Arthritis Mother    Hyperlipidemia Mother    Heart disease Mother     Hypertension Mother    Diabetes Mother    Arthritis Father    Hyperlipidemia Father    Heart disease Father    Hypertension Father    Diabetes Father    Alcohol abuse Sister    Hypertension Sister    Cancer Sister    PE: BP 124/70   Pulse 74   Ht 5' 1 (1.549 m)   Wt 157 lb 9.6 oz (71.5 kg)   SpO2 97%   BMI 29.78 kg/m  Wt Readings from Last 10 Encounters:  07/28/24 157 lb 9.6 oz (71.5 kg)  04/29/24 144 lb (65.3 kg)  03/29/24 150 lb 12.8 oz (68.4 kg)  02/25/24 148 lb (67.1 kg)  01/11/24 152 lb (68.9 kg)  01/07/24 154 lb (69.9 kg)  11/16/23 153 lb 6.4 oz (69.6 kg)  10/14/23 155 lb 12.8 oz (70.7 kg)  10/12/23 155 lb (70.3 kg)  10/02/23 155 lb 9.6 oz (70.6 kg)   Constitutional: normal weight, in NAD Eyes: no exophthalmos ENT: no thyromegaly, no cervical lymphadenopathy Cardiovascular: RRR, No MRG Respiratory: CTA B Musculoskeletal: no deformities Skin:  no rashes Neurological: + Head and hands tremors tremor with outstretched hands Diabetic Foot Exam - Simple   Simple Foot Form Diabetic Foot exam was performed with the following findings: Yes 07/28/2024 11:29 AM  Visual Inspection No deformities, no ulcerations, no other skin breakdown bilaterally: Yes Sensation Testing Intact to touch and monofilament testing bilaterally: Yes Pulse Check Posterior Tibialis and Dorsalis pulse intact bilaterally: Yes Comments    ASSESSMENT: 1. DM2, insulin -dependent, uncontrolled, with complications - PN  2.  Thyroid  nodules  3.  Hypothyroidism  PLAN:  1. Patient with longstanding, uncontrolled, type 2 diabetes, on oral  antidiabetic regimen with metformin , glucosidase inhibitor, GLP-1 receptor agonist and mealtime insulin , adjusted at last visit.  HbA1c was lower at that time, at 7.7%, decreased from 8.2%.  Sugars started to improve after adding insulin .  I advised her to take this 15 minutes before meals, as she was taking it right before meals, without a lag time.  We did not  change the regimen at that time but I did recommend a CGM.  I explained how this works.  I also advised her to stay well-hydrated. -At today's visit, sugars appear to have improved from before, but she does have blood sugar values above target, in the 200s.  Also, she wakes up usually in the 150-180 range.  Upon questioning, she does not feel well when the sugars are lower than 120 and is trying to maintain them above this range.  Reviewing her log, she is still bolusing 8 units of Humalog , she does not change the dose at all.  We discussed about increasing the dose for a meal with more carbs, and also, the use of slightly higher doses, even if she  it as a regular meal, if the sugars are higher before the meal.  Will continue the rest of the regimen for now.  At next visit, she may need addition of a long-acting insulin , but I was reticent to complicate her regimen for now. - I suggested to:  Patient Instructions  Please continue: - Metformin  ER 500 mg 2x daily - Acarbose  25 mg 3x a day - Rybelsus  14 mg in am - Humalog  8-10 units 15 min before meals   Please continue levothyroxine  50 mcg daily   Take the thyroid  hormone every day, with water, at least 30 minutes before breakfast, separated by at least 4 hours from: - acid reflux medications - calcium  - iron - multivitamins   Please stop at the lab.  Please return in 4-5 months.  - we checked her HbA1c: 8.2% (slightly higher) - advised to check sugars at different times of the day - 4x a day, rotating check times - advised for yearly eye exams >> she is UTD - will recheck an ACR today - return to clinic in 4-5 months per her preference  2.  Thyroid  nodules -Patient with history of 3 thyroid  nodules, without concerning features, but with 2 dominant nodules larger than 2 cm on the thyroid  ultrasound from 2022 -The 2 dominant larger nodules were biopsied in 10/2021 with indeterminant results, however, Afirma molecular marker returned  benign for both nodules - Denies neck compression symptoms - Before last visit, we repeated the thyroid  ultrasound that showed stability of the majority of the nodules, while one of the nodules was slightly smaller.  No imaging follow-up is further needed.  3.  Hypothyroidism - latest thyroid  labs reviewed with pt. >> normal: Lab Results  Component Value Date   TSH 2.170 10/02/2023  - she continues on LT4 50 mcg daily - pt feels good on this dose. - we discussed about taking the thyroid  hormone every day, with water, >30 minutes before breakfast, separated by >4 hours from acid reflux medications, calcium , iron, multivitamins. Pt. is taking it correctly. - will check thyroid  tests today: TSH and fT4 - If labs are abnormal, she will need to return for repeat TFTs in 1.5 months  Orders Placed This Encounter  Procedures   TSH   T4, free   Microalbumin / creatinine urine ratio   Lela Fendt, MD PhD [facility]

## 2024-07-28 NOTE — Telephone Encounter (Signed)
 Pt. Calling about her device monitor. Wants a c/b regarding her device status. Please advise.

## 2024-07-28 NOTE — Telephone Encounter (Signed)
 Returned call to Pt.  Spoke with Pt for 48 minutes.  Pt had questions about atrial fibrillation and her loop recorder.  Answered Pt's many questions regarding what causes atrial fibrillation and purpose of the loop recorder.  Pt states that she continues to have a constant feeling of dizziness/lightheadedness that began last November 2024.  She states she is seeing improvement but it is still there.  She does not feel safe to drive her car yet.  She would like to drive her car again.  Reassured Pt that her loop recorder was connected.  Discussed that Pt does have rare heartbeats in the 110-120 range but these are normal.  No atrial fibrillation or slow heart rates noted.  Advised Pt would continue to monitor and would call her if any abnormal rhythms noted.

## 2024-07-28 NOTE — Patient Instructions (Addendum)
 Please continue: - Metformin  ER 500 mg 2x daily - Acarbose  25 mg 3x a day - Rybelsus  14 mg in am - Humalog  8-10 units 15 min before meals   Please continue levothyroxine  50 mcg daily   Take the thyroid  hormone every day, with water, at least 30 minutes before breakfast, separated by at least 4 hours from: - acid reflux medications - calcium  - iron - multivitamins   Please stop at the lab.  Please return in 4-5 months.

## 2024-07-29 ENCOUNTER — Ambulatory Visit: Payer: Self-pay | Admitting: Internal Medicine

## 2024-07-29 LAB — MICROALBUMIN / CREATININE URINE RATIO
Creatinine, Urine: 84 mg/dL (ref 20–275)
Microalb Creat Ratio: 21 mg/g{creat} (ref <30)
Microalb, Ur: 1.8 mg/dL

## 2024-07-29 LAB — T4, FREE: Free T4: 1.4 ng/dL (ref 0.8–1.8)

## 2024-07-29 LAB — TSH: TSH: 1.69 m[IU]/L (ref 0.40–4.50)

## 2024-07-29 NOTE — Addendum Note (Signed)
 Addended by: CLEOTILDE ROLIN RAMAN on: 07/29/2024 11:19 AM   Modules accepted: Orders

## 2024-08-01 ENCOUNTER — Ambulatory Visit

## 2024-08-01 DIAGNOSIS — I471 Supraventricular tachycardia, unspecified: Secondary | ICD-10-CM

## 2024-08-04 LAB — CUP PACEART REMOTE DEVICE CHECK
Date Time Interrogation Session: 20250904002500
Implantable Pulse Generator Implant Date: 20250530
Pulse Gen Serial Number: 136161

## 2024-08-07 ENCOUNTER — Ambulatory Visit: Payer: Self-pay | Admitting: Cardiology

## 2024-08-09 NOTE — Progress Notes (Signed)
 Remote Loop Recorder Transmission

## 2024-08-25 DIAGNOSIS — M25512 Pain in left shoulder: Secondary | ICD-10-CM | POA: Diagnosis not present

## 2024-08-25 DIAGNOSIS — M791 Myalgia, unspecified site: Secondary | ICD-10-CM | POA: Diagnosis not present

## 2024-08-25 DIAGNOSIS — M25511 Pain in right shoulder: Secondary | ICD-10-CM | POA: Diagnosis not present

## 2024-08-25 DIAGNOSIS — M542 Cervicalgia: Secondary | ICD-10-CM | POA: Diagnosis not present

## 2024-08-31 NOTE — Progress Notes (Signed)
 Remote Loop Recorder Transmission

## 2024-09-01 ENCOUNTER — Ambulatory Visit

## 2024-09-01 ENCOUNTER — Ambulatory Visit: Payer: Self-pay | Admitting: Cardiology

## 2024-09-01 DIAGNOSIS — I480 Paroxysmal atrial fibrillation: Secondary | ICD-10-CM | POA: Diagnosis not present

## 2024-09-01 LAB — CUP PACEART REMOTE DEVICE CHECK
Date Time Interrogation Session: 20251002012900
Implantable Pulse Generator Implant Date: 20250530
Pulse Gen Serial Number: 136161

## 2024-09-02 ENCOUNTER — Telehealth: Payer: Self-pay | Admitting: Internal Medicine

## 2024-09-02 ENCOUNTER — Other Ambulatory Visit: Payer: Self-pay | Admitting: Internal Medicine

## 2024-09-02 NOTE — Telephone Encounter (Signed)
 Refill sent through rx request.

## 2024-09-02 NOTE — Telephone Encounter (Signed)
 MEDICATION: Levothyroxine  Sodium 50 MCG  PHARMACY: [id number] - [place], [place] - [address] AT [facility] OF [address], [place] [address] Phone: [phone]  Fax: [phone]    HAS THE PATIENT CONTACTED THEIR PHARMACY?  Yes  LAST REFILL: 07/28/2024  IS THIS A 90 DAY SUPPLY : Yes  IS PATIENT OUT OF MEDICATION: Yes  IF NOT; HOW MUCH IS LEFT:   LAST APPOINTMENT DATE: 07/28/2024  NEXT APPOINTMENT DATE:@1 /29/2026  DO WE HAVE YOUR PERMISSION TO LEAVE A DETAILED MESSAGE Yes  OTHER COMMENTS:    **Let patient know to contact pharmacy at the end of the day to make sure medication is ready. **  ** Please notify patient to allow 48-72 hours to process**  **Encourage patient to contact the pharmacy for refills or they can request refills through [facility]**

## 2024-09-05 NOTE — Progress Notes (Signed)
 Remote Loop Recorder Transmission

## 2024-09-08 DIAGNOSIS — E538 Deficiency of other specified B group vitamins: Secondary | ICD-10-CM | POA: Diagnosis not present

## 2024-09-08 DIAGNOSIS — I4891 Unspecified atrial fibrillation: Secondary | ICD-10-CM | POA: Diagnosis not present

## 2024-09-08 DIAGNOSIS — Z0000 Encounter for general adult medical examination without abnormal findings: Secondary | ICD-10-CM | POA: Diagnosis not present

## 2024-09-08 DIAGNOSIS — E1165 Type 2 diabetes mellitus with hyperglycemia: Secondary | ICD-10-CM | POA: Diagnosis not present

## 2024-09-08 DIAGNOSIS — E039 Hypothyroidism, unspecified: Secondary | ICD-10-CM | POA: Diagnosis not present

## 2024-09-08 DIAGNOSIS — I10 Essential (primary) hypertension: Secondary | ICD-10-CM | POA: Diagnosis not present

## 2024-09-08 DIAGNOSIS — E782 Mixed hyperlipidemia: Secondary | ICD-10-CM | POA: Diagnosis not present

## 2024-09-08 DIAGNOSIS — E559 Vitamin D deficiency, unspecified: Secondary | ICD-10-CM | POA: Diagnosis not present

## 2024-09-08 DIAGNOSIS — Z23 Encounter for immunization: Secondary | ICD-10-CM | POA: Diagnosis not present

## 2024-09-08 DIAGNOSIS — M85852 Other specified disorders of bone density and structure, left thigh: Secondary | ICD-10-CM | POA: Diagnosis not present

## 2024-09-10 ENCOUNTER — Other Ambulatory Visit: Payer: Self-pay | Admitting: Cardiovascular Disease

## 2024-10-03 ENCOUNTER — Ambulatory Visit (INDEPENDENT_AMBULATORY_CARE_PROVIDER_SITE_OTHER)

## 2024-10-03 DIAGNOSIS — I480 Paroxysmal atrial fibrillation: Secondary | ICD-10-CM

## 2024-10-03 LAB — CUP PACEART REMOTE DEVICE CHECK
Date Time Interrogation Session: 20251103003400
Implantable Pulse Generator Implant Date: 20250530
Pulse Gen Serial Number: 136161

## 2024-10-05 ENCOUNTER — Ambulatory Visit: Payer: Self-pay | Admitting: Cardiology

## 2024-10-05 ENCOUNTER — Other Ambulatory Visit: Payer: Self-pay | Admitting: Internal Medicine

## 2024-10-05 DIAGNOSIS — E1165 Type 2 diabetes mellitus with hyperglycemia: Secondary | ICD-10-CM

## 2024-10-05 NOTE — Telephone Encounter (Signed)
 Refill request complete

## 2024-10-06 NOTE — Progress Notes (Unsigned)
 Cardiology Office Note:  .   Date:  10/07/2024  ID:  [name], DOB 01-09-1941, MRN [id number] PCP: Seabron Lenis, [name]  [facility] Providers Cardiologist:  Darryle ONEIDA Decent, [name] Electrophysiologist:  Fonda Kitty, [name] { History of Present Illness: .    Chief Complaint  Patient presents with   Follow-up    [name] is a 83 y.o. female with history of pAF, [name], Jennifer who presents for follow-up.    History of Present Illness   [name] is an 83 year old female with atrial [name], [name], [name], Jennifer who presents with persistent fatigue and weakness.  She has experienced persistent fatigue and weakness since being diagnosed with atrial [name] last November. Despite treatment, she continues to experience tiredness and occasional dizziness. Her energy levels remain low, and she feels the fatigue is constant, affecting her ability to perform daily activities without frequent rest.  She is on heart medication, which has improved her condition somewhat, but she still experiences episodes of dizziness and fatigue. She is not currently on Eliquis  due to experiencing epistaxis and is taking blood pressure medication. Her blood pressure fluctuates but is generally controlled. She is also on Multaq  and metoprolol , for which she needs refills.  A loop recorder was placed by Dr. Kitty. She monitors her blood pressure at home. Her social history includes efforts to stay active despite her symptoms, as she does not have access to a gym or similar facilities.  She experiences persistent fatigue and occasional dizziness.          Problem List [name] -A1c 8.2 Jennifer -T chol 178, HDL 71, LDL 76, TG 171 [name] Paroxysmal atrial [name]  -negative MPI 10/12/2023 -TTE 60-65% 11/05/2023 - no COAD due to nose bleeds  PVCs  -3.2% burden  6. CAD - CAC 426 (76th percentile) - moderate 50-69% mid LAD CT FFR 0.80    ROS: All other ROS reviewed and negative. Pertinent positives noted  in the HPI.     Studies Reviewed: SABRA       CCTA 03/03/2024 IMPRESSION: 1. Coronary calcium  score of 426.   2. Total plaque volume (TPV) 398 mm3 which is 47 percentile for age-and sex matched controls (calcified plaque 87 mm3; non-calcified plaque 311 mm3). TPV is moderate.   3. Normal coronary origin with right dominance.   4. Moderate non obstructive CAD, FFR LAD 0.8 . Physical Exam:   VS:  BP 130/68 (BP Location: Left Arm, Patient Position: Sitting, Cuff Size: Normal)   Pulse 71   Ht 5' 4 (1.626 m)   Wt 164 lb (74.4 kg)   SpO2 98%   BMI 28.15 kg/m    Wt Readings from Last 3 Encounters:  10/07/24 164 lb (74.4 kg)  07/28/24 157 lb 9.6 oz (71.5 kg)  04/29/24 144 lb (65.3 kg)    GEN: Well nourished, well developed in no acute distress NECK: No JVD; No carotid bruits CARDIAC: RRR, no murmurs, rubs, gallops RESPIRATORY:  Clear to auscultation without rales, wheezing or rhonchi  ABDOMEN: Soft, non-tender, non-distended EXTREMITIES:  No edema; No deformity  ASSESSMENT AND PLAN: .   Assessment and Plan    Paroxysmal atrial [name] Well-controlled, no episodes on loop recorder. Not on Eliquis  due to epistaxis. Discussed Watchman procedure as alternative to anticoagulation for stroke risk reduction. She is interested. - Continue Multaq  and metoprolol . - Discuss Watchman procedure with electrophysiologist. - Send message to electrophysiologist regarding interest in Watchman procedure.  Atherosclerotic heart disease of native coronary artery  without angina Moderate mid LAD lesion on coronary CTA, negative by CTFFR. No angina symptoms. Normal heart function. - Continue current management.  Essential hypertension Blood pressure well-controlled on Benicar and metoprolol . Monitors at home. - Continue Benicar 5 mg daily and metoprolol  25 mg daily. - Encouraged regular home blood pressure monitoring.  Mixed hyperlipidemia LDL 76 mg/dL. On Crestor  5 mg daily, cannot tolerate  higher doses. - Continue Crestor  5 mg daily.  Deconditioning Persistent low energy and weakness likely due to deconditioning post-heart issues. Encouraged activity to improve conditioning. - Encouraged regular physical activity as tolerated. - Advised to monitor energy levels and adjust activity accordingly.      [facility] Referral for Left Atrial Appendage Closure with Non-Valvular Atrial [name]   [name] is a 83 y.o. female is being referred to the [facility] Team for evaluation for Left Atrial Appendage Closure with Watchman device for the management of stroke risk resulting form non-valvular atrial [name].    Base upon [name]'s history, she is felt to be a poor candidate for long-term anticoagulation because of a history of bleeding (e.g. intracerebral, subdural, GI, retro-peritoneal).  The patient has a HAS-BLED score of 4 indicating a Yearly Major Bleeding Risk of 8.7%.  {  Her CHADS2-VASc Score is 5 with an unadjusted Ischemic Stroke Rate (% per year) of 7.2%.    Her stroke risk necessitates a strategy of stroke prevention with either long-term oral anticoagulation or left atrial appendage occlusion therapy. We have discussed their bleeding risk in the context of their comorbid medical problems, as well as the rationale for referral for evaluation of Watchman left atrial appendage occlusion therapy. While the patient is at high long-term bleeding risk, they may be appropriate for short-term anticoagulation. Based on this individual patient's stroke and bleeding risk, a shared decision has been made to refer the patient for consideration of Watchman left atrial appendage closure utilizing the [employer] shared decision tool.         Follow-up: Return in about 1 year (around 10/07/2025).   Signed, Darryle DASEN. Barbaraann, [name], [facility]  [facility]  [address] [place], [place] [address] [phone]   1:32 PM

## 2024-10-07 ENCOUNTER — Encounter: Payer: Self-pay | Admitting: Cardiovascular Disease

## 2024-10-07 ENCOUNTER — Ambulatory Visit: Attending: Cardiovascular Disease | Admitting: Cardiovascular Disease

## 2024-10-07 VITALS — BP 130/68 | HR 71 | Ht 64.0 in | Wt 164.0 lb

## 2024-10-07 DIAGNOSIS — I480 Paroxysmal atrial fibrillation: Secondary | ICD-10-CM | POA: Diagnosis not present

## 2024-10-07 DIAGNOSIS — I10 Essential (primary) hypertension: Secondary | ICD-10-CM

## 2024-10-07 DIAGNOSIS — E782 Mixed hyperlipidemia: Secondary | ICD-10-CM | POA: Diagnosis not present

## 2024-10-07 DIAGNOSIS — I2510 Atherosclerotic heart disease of native coronary artery without angina pectoris: Secondary | ICD-10-CM | POA: Diagnosis not present

## 2024-10-07 MED ORDER — METOPROLOL SUCCINATE ER 25 MG PO TB24: 25.0000 mg | ORAL_TABLET | Freq: Every day | ORAL | 3 refills | Status: AC

## 2024-10-07 MED ORDER — MULTAQ 400 MG PO TABS: 400.0000 mg | ORAL_TABLET | Freq: Two times a day (BID) | ORAL | 1 refills | Status: AC

## 2024-10-07 NOTE — Progress Notes (Signed)
 Remote Loop Recorder Transmission

## 2024-10-07 NOTE — Patient Instructions (Addendum)
 Medication Instructions:  Your physician recommends that you continue on your current medications as directed. Please refer to the Current Medication list given to you today.  *If you need a refill on your cardiac medications before your next appointment, please call your pharmacy*  Lab Work: None ordered.  You may go to any [facility] Location for your lab work:  [facility] - [address] ([facility]) - [address] [phone] [address]  [place] - [address]   [place]  - [address]   [place] - [address] - [address] [place]  - [address] ([facility]   If you have labs (blood work) drawn today and your tests are completely normal, you will receive your results only by: [employer] (if you have MyChart)  If you have any lab test that is abnormal or we need to change your treatment, we will call you or send a MyChart message to review the results.  Testing/Procedures: None ordered.  Follow-Up: At [facility], you and your health needs are our priority.  As part of our continuing mission to provide you with exceptional heart care, we have created designated Provider Care Teams.  These Care Teams include your primary Cardiologist (physician) and Advanced Practice Providers (APPs -  Physician Assistants and Nurse Practitioners) who all work together to provide you with the care you need, when you need it.  Your next appointment:   1 year(s)  The format for your next appointment:   In Person  Provider:   Advanced Practice Provider

## 2024-10-24 ENCOUNTER — Encounter: Admitting: Physician Assistant

## 2024-10-24 NOTE — Progress Notes (Unsigned)
 . Cardiology Office Note:  .   Date:  10/24/2024  ID:  [name], DOB Sep 11, 1941, MRN [id number] PCP: Seabron Lenis, MD  [facility] Providers Cardiologist:  Darryle ONEIDA Decent, MD Electrophysiologist:  Fonda Kitty, MD {  History of Present Illness: .   [name] is a 83 y.o. female w/PMHx of  HTN, HLD, DM AFib  Dec monitor Impression: SVT was present but some appears to be atrial fibrillation.  Occasional PVCs (3.2% burden).   She saw Dr. Burton 01/03/24, palpitations improved on BB, though ongoing daily symptoms of AFib and started on Multaq , referred to EP. Also c/o some CP and planned for coronary CT  Saw Dr. Kitty 01/11/24, at this time she had not yet started the Multaq  and was advised to go ahead and start. Pending her CT, if no significant CAD could consider Flecainide if Multaq  failed Discussed that despite paroxysmal nature and relatively short duration of episodes, patient reports constant daily fatigue. This appears to be out of proportion to her burden of atrial fibrillation.  Planned to repeat monitor on AAD and try to better correlate symptoms to arrhythmia  April 2025 monitor HR 55 - 156, average 71 bpm. 1 nonsustained SVT (longest 5 beats) and 1 nonsustained VT (longest 4 beats). No atrial fibrillation detected. Rare supraventricular ectopy. Rare ventricular ectopy. No sustained arrhythmias. Symptom trigger episodes correspond to sinus rhythm with ectopy. And ectopy overall was rare  I saw her 03/30/23 She comes today accompanied by a dear friend that she considers a sister. She just doesn't feel well, since all of this started, seems to be referring to AFib/rhythm abnormalities, but does say that since she is on the two heart pills feels slight improved. She is happy to hear that her heart rhythm on the f/u monitor was improved Symptoms are hard to get a hold of She never feels well, she has some days that she feels less bad, but never  good. While here she seems to wince/grimace as if in pain, but denies any, says it is just how she feels, bad Not lightheaded, not in pain, just feels weak and tired Very tired, no energy, says she used to be able to go-go-go all day and can hardly do anything now for months and months Her PMD a=did labs last week, unknown results No CP, not SOB She reports she sleeps well Does have a lot of personal worries her older sister and a nephew with cancer, neither with good prognosis and these are the last of her blood family, worries about them a lot She started to have daily nose bleeds with the Eliquis , none further since stopping it Required holding pressure/using cold compress to get them to stop  Symptoms felt not to be cardiac/rhythm driven Planned to try xarelto    04/08/24: Recurrent bleeding on xarelto  >> discussed loop implant  04/29/24: saw Dr. Kitty Sever ILR implant to better understand her symptoms  10/07/24 saw Dr. Burton. C/w reports of persistent fatigue, dizziness.  Fluctuating BPs No AFib on ILR Persistent low energy and weakness likely due to deconditioning post-heart issues. Encouraged activity to improve conditioning. - Encouraged regular physical activity as tolerated.    Today's visit is scheduled as planned f/u ROS:   She is again accompanied by her dear friend. She continues to feel generally fatigued, and not back to her usual exertional capacity/stamina since she had her event last November. That being said, she does think that she feels better then she did 3-5 months  ago too. Difficult for her having been used to doing as much as she wanted now having to pace herself. No CP, palpitations or cardiac awareness No SOB at rest some DOE No near syncope or syncope Maybe some dizziness now and then  She asks about getting watchman done, that Dr. Barbaraann mentioned he thought was a good idea   Device information [employer] ILR implanted 04/29/24, AFib  survellience  Arrhythmia/AAD hx Multaq  started Feb 2025  Studies Reviewed: SABRA    EKG not done today  ILR interrogation done today and reviewed by myself No device observations of any kind to date No AFib   02/18/24: coronary CTa IMPRESSION: 1. Coronary calcium  score of 426.   2. Total plaque volume (TPV) 398 mm3 which is 47 percentile for age-and sex matched controls (calcified plaque 87 mm3; non-calcified plaque 311 mm3). TPV is moderate.   3. Normal coronary origin with right dominance.   4. Moderate non obstructive CAD, FFR LAD 0.8 .   TTE 11/05/2023  1. Left ventricular ejection fraction, by estimation, is 60 to 65%. The  left ventricle has normal function. The left ventricle has no regional  wall motion abnormalities. Left ventricular diastolic parameters are  consistent with Grade I diastolic  dysfunction (impaired relaxation).   2. Right ventricular systolic function is normal. The right ventricular  size is normal. Tricuspid regurgitation signal is inadequate for assessing  PA pressure.   3. The mitral valve is degenerative. No evidence of mitral valve  regurgitation. Moderate mitral annular calcification.   4. The aortic valve is grossly normal. Aortic valve regurgitation is not  visualized.   5. The inferior vena cava is normal in size with greater than 50%  respiratory variability, suggesting right atrial pressure of 3 mmHg.    NM Stress 10/12/2023   Findings are consistent with no ischemia. The study is low risk.   No ST deviation was noted. The ECG was not diagnostic due to pharmacologic protocol.   LV perfusion is abnormal. Defect 1: There is a large defect with moderate reduction in uptake present in the apical to basal inferior, inferolateral, inferoseptal and apex location(s) that is fixed. There is normal wall motion in the defect area.   Left ventricular function is abnormal. Global function is mildly reduced. There were no regional wall motion  abnormalities. Nuclear stress EF: 53%. The left ventricular ejection fraction is mildly decreased (45-54%). End diastolic cavity size is normal. End systolic cavity size is normal.   Prior study not available for comparison.   Risk Assessment/Calculations:    Physical Exam:   VS:  There were no vitals taken for this visit.   Wt Readings from Last 3 Encounters:  10/07/24 164 lb (74.4 kg)  07/28/24 157 lb 9.6 oz (71.5 kg)  04/29/24 144 lb (65.3 kg)    GEN: Well nourished, well developed in no acute distress NECK: No JVD; No carotid bruits CARDIAC: RRR, no murmurs, rubs, gallops RESPIRATORY: CTA b/l without rales, wheezing or rhonchi  ABDOMEN: Soft, non-tender, non-distended EXTREMITIES: No edema; No deformity   ASSESSMENT AND PLAN: .    paroxysmal AFib CHA2DS2Vasc is 5, off OAC 2/2 recurrent nose bleeds On multaq  Now s/p ILR  Zero % burden  Discussed role of watchman Given no AFib, and ILR/reliable rhythm monitoring I think we continue watchful waiting Should we see any significant AFib, could circle back to watchman, though did discuss that she would typically need to be able to tolerate OAC for a few months  peri-procedure   SVT None noted  HTN Looks ok  Secondary hypercoagulable state 2/2 AFib    4. Fatigue Agree, perhaps deconditioned state, likely multifactorial She follows closely with her PMD and endo as well Encouraged regular activity to her capacity   Dispo: remotes as usual, back in 10mo, sooner if needed  Signed, Charlies Macario Arthur, PA-C

## 2024-10-25 ENCOUNTER — Telehealth: Payer: Self-pay | Admitting: Internal Medicine

## 2024-10-25 ENCOUNTER — Other Ambulatory Visit: Payer: Self-pay | Admitting: Internal Medicine

## 2024-10-25 DIAGNOSIS — E119 Type 2 diabetes mellitus without complications: Secondary | ICD-10-CM

## 2024-10-25 MED ORDER — INSULIN PEN NEEDLE 32G X 4 MM MISC: 12 refills | Status: DC

## 2024-10-25 MED ORDER — RYBELSUS 14 MG PO TABS: 14.0000 mg | ORAL_TABLET | Freq: Every day | ORAL | 3 refills | Status: AC

## 2024-10-25 NOTE — Telephone Encounter (Signed)
 MEDICATION: Rybelsus   PHARMACY: [id number] - [place], [place] - [address] AT [facility] OF [address], [place] [address] Phone: [phone]  Fax: [phone]    HAS THE PATIENT CONTACTED THEIR PHARMACY?  Yes  LAST REFILL:  @@LASTREFILL @  IS THIS A 90 DAY SUPPLY : Yes  IS PATIENT OUT OF MEDICATION: Yes  IF NOT; HOW MUCH IS LEFT: N/A  LAST APPOINTMENT DATE: @11 /25/2025  NEXT APPOINTMENT DATE:@1 /29/2026  DO WE HAVE YOUR PERMISSION TO LEAVE A DETAILED MESSAGE?: Yes  OTHER COMMENTS:    **Let patient know to contact pharmacy at the end of the day to make sure medication is ready. **  ** Please notify patient to allow 48-72 hours to process**  **Encourage patient to contact the pharmacy for refills or they can request refills through [facility]**

## 2024-10-25 NOTE — Telephone Encounter (Signed)
 Patient is requesting a call and/or voicemail to confirm all RX are sent to pharmacy. [phone]

## 2024-10-25 NOTE — Telephone Encounter (Signed)
 Requested Prescriptions   Signed Prescriptions Disp Refills   Semaglutide  (RYBELSUS ) 14 MG TABS 90 tablet 3    Sig: Take 1 tablet (14 mg total) by mouth daily.    Authorizing Provider: TRIXIE FILE    Ordering User: Tamica Covell S   Insulin  Pen Needle 32G X 4 MM MISC 300 each 12    Sig: Use 3x a day before meals    Authorizing Provider: TRIXIE FILE    Ordering User: CLEOTILDE ROLIN RAMAN

## 2024-10-26 ENCOUNTER — Ambulatory Visit: Attending: Physician Assistant | Admitting: Physician Assistant

## 2024-10-26 ENCOUNTER — Telehealth: Payer: Self-pay

## 2024-10-26 VITALS — BP 120/72 | HR 86 | Ht 64.0 in | Wt 164.0 lb

## 2024-10-26 DIAGNOSIS — D6869 Other thrombophilia: Secondary | ICD-10-CM

## 2024-10-26 DIAGNOSIS — R5383 Other fatigue: Secondary | ICD-10-CM

## 2024-10-26 DIAGNOSIS — I471 Supraventricular tachycardia, unspecified: Secondary | ICD-10-CM | POA: Diagnosis not present

## 2024-10-26 DIAGNOSIS — Z95818 Presence of other cardiac implants and grafts: Secondary | ICD-10-CM | POA: Diagnosis not present

## 2024-10-26 DIAGNOSIS — I480 Paroxysmal atrial fibrillation: Secondary | ICD-10-CM | POA: Diagnosis not present

## 2024-10-26 MED ORDER — INSULIN PEN NEEDLE 32G X 4 MM MISC: 12 refills | Status: AC

## 2024-10-26 NOTE — Telephone Encounter (Signed)
 Patient called requesting needle refill.  Refill sent.

## 2024-10-26 NOTE — Patient Instructions (Signed)
 Medication Instructions:   Your physician recommends that you continue on your current medications as directed. Please refer to the Current Medication list given to you today.  *If you need a refill on your cardiac medications before your next appointment, please call your pharmacy*  Lab Work:  NONE ORDERED  TODAY   If you have labs (blood work) drawn today and your tests are completely normal, you will receive your results only by: MyChart Message (if you have MyChart) OR A paper copy in the mail If you have any lab test that is abnormal or we need to change your treatment, we will call you to review the results.  Testing/Procedures:  NONE ORDERED  TODAY    Follow-Up: At [facility], you and your health needs are our priority.  As part of our continuing mission to provide you with exceptional heart care, our providers are all part of one team.  This team includes your primary Cardiologist (physician) and Advanced Practice Providers or APPs (Physician Assistants and Nurse Practitioners) who all work together to provide you with the care you need, when you need it.  Your next appointment:   6 month(s)  Provider:   You may see Ardeen Kohler, MD or one of the following Advanced Practice Providers on your designated Care Team:   Mertha Abrahams, [place]  We recommend signing up for the patient portal called "MyChart".  Sign up information is provided on this After Visit Summary.  MyChart is used to connect with patients for Virtual Visits (Telemedicine).  Patients are able to view lab/test results, encounter notes, upcoming appointments, etc.  Non-urgent messages can be sent to your provider as well.   To learn more about what you can do with MyChart, go to [email or web address].   Other Instructions

## 2024-11-03 ENCOUNTER — Ambulatory Visit

## 2024-11-03 DIAGNOSIS — I480 Paroxysmal atrial fibrillation: Secondary | ICD-10-CM

## 2024-11-04 LAB — CUP PACEART REMOTE DEVICE CHECK
Date Time Interrogation Session: 20251204033500
Implantable Pulse Generator Implant Date: 20250530
Pulse Gen Serial Number: 136161

## 2024-11-06 ENCOUNTER — Ambulatory Visit: Payer: Self-pay | Admitting: Cardiology

## 2024-11-08 NOTE — Progress Notes (Signed)
 Remote Loop Recorder Transmission

## 2024-11-16 ENCOUNTER — Telehealth: Payer: Self-pay

## 2024-11-16 NOTE — Telephone Encounter (Signed)
 I called and spoke with the patient at: 9:52am.  She was offered an appt per Dr. Trixie but she has some transportation troubles. She has been advised of the instructions per Dr. Trixie and she wrote them down and repeated them back to me. Voiced understanding.

## 2024-11-16 NOTE — Telephone Encounter (Signed)
 Per the fax paper time stamped 10:51pm:  Patient went to Ortho and received injections and her blood sugars are running in the high 400s. She states that her blood sugar  was 465. She took Acarbose , Metformin  and 8 units of Humalog   The on call provider was paged multiple times but no answer from the ON Call Provider Motwani.    I called the patient at 9:22 am:  She states that her blood sugar is down to 277 (fingerstick) NO meds this morning, just the meds from last night.

## 2024-11-16 NOTE — Telephone Encounter (Signed)
 J, Can she come to the clinic for a visit tomorrow at 10:40 or 11 am?  For now, since her sugars came down, please make sure that she is taking Humalog  before each meal but may need higher doses, if she was taking 8 to 10 units before, she may need 12 units.  Also, if she checks blood sugars 2 hours after the meals and they are above 200, she can take 2 to 4 units for correction.

## 2024-12-05 ENCOUNTER — Ambulatory Visit

## 2024-12-05 DIAGNOSIS — I480 Paroxysmal atrial fibrillation: Secondary | ICD-10-CM | POA: Diagnosis not present

## 2024-12-05 LAB — CUP PACEART REMOTE DEVICE CHECK
Date Time Interrogation Session: 20260104003900
Implantable Pulse Generator Implant Date: 20250530
Pulse Gen Serial Number: 136161

## 2024-12-10 ENCOUNTER — Ambulatory Visit: Payer: Self-pay | Admitting: Cardiology

## 2024-12-12 NOTE — Progress Notes (Signed)
 Remote Loop Recorder Transmission

## 2024-12-29 ENCOUNTER — Ambulatory Visit: Admitting: Internal Medicine

## 2025-01-05 ENCOUNTER — Encounter

## 2025-01-05 LAB — CUP PACEART REMOTE DEVICE CHECK
Date Time Interrogation Session: 20260205044300
Implantable Pulse Generator Implant Date: 20250530
Pulse Gen Serial Number: 136161

## 2025-01-25 ENCOUNTER — Ambulatory Visit: Admitting: Podiatry

## 2025-01-30 ENCOUNTER — Ambulatory Visit: Admitting: Internal Medicine

## 2025-02-02 ENCOUNTER — Ambulatory Visit: Admitting: Internal Medicine

## 2025-02-06 ENCOUNTER — Encounter
